# Patient Record
Sex: Female | Born: 1940 | Race: White | Hispanic: No | Smoking: Never smoker
Health system: Southern US, Community
[De-identification: ages and names within clinical notes are randomized; demographics above are authoritative.]

## PROBLEM LIST (undated history)

## (undated) DIAGNOSIS — I10 Essential (primary) hypertension: Secondary | ICD-10-CM

## (undated) DIAGNOSIS — I4891 Unspecified atrial fibrillation: Secondary | ICD-10-CM

## (undated) DIAGNOSIS — M549 Dorsalgia, unspecified: Secondary | ICD-10-CM

## (undated) HISTORY — PX: TUBAL LIGATION: SHX77

## (undated) HISTORY — DX: Essential (primary) hypertension: I10

## (undated) HISTORY — PX: TONSILLECTOMY: SUR1361

## (undated) HISTORY — DX: Dorsalgia, unspecified: M54.9

## (undated) HISTORY — DX: Unspecified atrial fibrillation: I48.91

---

## 2009-11-04 ENCOUNTER — Ambulatory Visit: Payer: Self-pay | Admitting: Family Medicine

## 2009-11-04 DIAGNOSIS — E785 Hyperlipidemia, unspecified: Secondary | ICD-10-CM

## 2009-11-04 DIAGNOSIS — M549 Dorsalgia, unspecified: Secondary | ICD-10-CM | POA: Insufficient documentation

## 2009-11-04 DIAGNOSIS — Z780 Asymptomatic menopausal state: Secondary | ICD-10-CM | POA: Insufficient documentation

## 2009-11-04 DIAGNOSIS — E559 Vitamin D deficiency, unspecified: Secondary | ICD-10-CM | POA: Insufficient documentation

## 2009-11-04 DIAGNOSIS — R5381 Other malaise: Secondary | ICD-10-CM

## 2009-11-04 DIAGNOSIS — R5383 Other fatigue: Secondary | ICD-10-CM

## 2009-11-10 ENCOUNTER — Ambulatory Visit: Payer: Self-pay | Admitting: Family Medicine

## 2009-11-10 DIAGNOSIS — S63509A Unspecified sprain of unspecified wrist, initial encounter: Secondary | ICD-10-CM | POA: Insufficient documentation

## 2009-11-10 DIAGNOSIS — M25539 Pain in unspecified wrist: Secondary | ICD-10-CM

## 2009-11-10 DIAGNOSIS — M810 Age-related osteoporosis without current pathological fracture: Secondary | ICD-10-CM | POA: Insufficient documentation

## 2009-11-12 ENCOUNTER — Telehealth (INDEPENDENT_AMBULATORY_CARE_PROVIDER_SITE_OTHER): Payer: Self-pay | Admitting: *Deleted

## 2010-03-10 ENCOUNTER — Ambulatory Visit
Admission: RE | Admit: 2010-03-10 | Discharge: 2010-03-10 | Payer: Self-pay | Source: Home / Self Care | Attending: Family Medicine | Admitting: Family Medicine

## 2010-03-10 DIAGNOSIS — R030 Elevated blood-pressure reading, without diagnosis of hypertension: Secondary | ICD-10-CM | POA: Insufficient documentation

## 2010-03-31 NOTE — Progress Notes (Signed)
  Phone Note Outgoing Call   Call placed by: Lajean Saver RN,  November 12, 2009 3:42 PM Call placed to: Patient  Follow-up for Phone Call        Phone call completed Pateint is still wearing her brace and says the swelling has decreased. She still has some pain when turning her wrist. She was told to call with any questions. Follow-up by: Lajean Saver RN,  November 12, 2009 3:43 PM

## 2010-03-31 NOTE — Assessment & Plan Note (Signed)
Summary: NOV high cholesterol   Vital Signs:  Patient profile:   70 year old female Menstrual status:  postmenopausal Height:      66 inches Weight:      143 pounds BMI:     23.16 O2 Sat:      97 % on Room air Pulse rate:   77 / minute BP sitting:   147 / 85  (left arm) Cuff size:   regular  Vitals Entered By: Payton Spark CMA (November 04, 2009 9:35 AM)  O2 Flow:  Room air CC: New to est.     Menstrual Status postmenopausal   Primary Care [facility] Favor:  Seymour Bars DO  CC:  New to est..  History of Present Illness: 70 yo WF presents for NOV.  She has a hx of lumbar DDD with ? lumbar stenosis from work related 'wear and tear' injury.  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  Previously she was only seen by Dr Su Monks.  She is doing well on Robaxin 2-3 x a day.  Takes Excedrin only as needed.  She has hx of high cholesterol , migraines and allergies.  She has not had labs drawn since 1992.  She has not had a colonoscopy or mammogram in years.  She is sad to report that her husband died 2 yrs ago from liver cancer.  She declines need for anti depressants.  Her son is supportive and helping her out with their farm and horses.    For her back, she had done injections, therapy and meds.  Has not needed surgery.  Robaxin helps.  Has some leg weakness but rests ever 30 min with physical activity.    Current Medications (verified): 1)  Methocarbamol 500 Mg Tabs (Methocarbamol) .... Take 1 Tab By Mouth Three Times A Day 2)  Allegra Allergy 180 Mg Tabs (Fexofenadine Hcl) .... Take 1 Tab By Mouth Once Daily 3)  Imitrex 100 Mg Tabs (Sumatriptan Succinate) .... Take As Directed As Needed 4)  Lovaza 1 Gm Caps (Omega-3-Acid Ethyl Esters) .... Take 1 Tab By Mouth Once Daily 5)  Coq10 30 Mg Caps (Coenzyme Q10) .... Take As Directed  Allergies (verified): No Known Drug Allergies  Past History:  Past Medical History: back pain  1992  Past Surgical History: BTL   tonsilectomy  Family History: father died ETOH 2 brothers - one died with cancer, unknown primary mother 59, alive  Social History: Retired.  Finished HS. Widowed since 09. son and daughter are local. Owns a farm and shows horses.   Never smoked. Denies ETOH.  Review of Systems       no fevers/sweats/weakness, unexplained wt loss/gain, no change in vision, no difficulty hearing, ringing in ears, no hay fever/allergies, no CP/discomfort, no palpitations, no breast lump/nipple discharge, no cough/wheeze, no blood in stool,no  N/V/D, no nocturia, no leaking urine, no unusual vag bleeding, no vaginal/penile discharge, no muscle/joint pain, no rash, no new/changing mole, no HA, no memory loss, no anxiety, no sleep problem, no depression, no unexplained lumps, no easy bruising/bleeding, no concern with sexual function   Physical Exam  General:  alert, well-developed, well-nourished, and well-hydrated.   Head:  normocephalic and atraumatic.   Mouth:  pharynx pink and moist.   Neck:  no masses.   Lungs:  Normal respiratory effort, chest expands symmetrically. Lungs are clear to auscultation, no crackles or wheezes. Heart:  Normal rate and regular rhythm. S1 and S2 normal without gallop, murmur, click, rub or other  extra sounds. Abdomen:  soft, non-tender, normal bowel sounds, no distention, no masses, no guarding, no hepatomegaly, and no splenomegaly.   Extremities:  no LE edema Skin:  color normal.   Cervical Nodes:  No lymphadenopathy noted Psych:  good eye contact and depressed affect.     Impression & Recommendations:  Problem # 1:  BACK PAIN, CHRONIC (ICD-724.5) Stable on current meds.  Staying physically active. Her updated medication list for this problem includes:    Methocarbamol 500 Mg Tabs (Methocarbamol) .Marland Kitchen... Take 1 tab by mouth three times a day    Excedrin Migraine 250-250-65 Mg Tabs (Aspirin-acetaminophen-caffeine) .Marland Kitchen... Take as directed as needed  Problem # 2:   HYPERLIPIDEMIA (ICD-272.4) Will update her labs and decide if Lovaza needs to be increased. Her updated medication list for this problem includes:    Lovaza 1 Gm Caps (Omega-3-acid ethyl esters) .Marland Kitchen... Take 1 tab by mouth once daily  Orders: T-Lipid Profile ([id number])  Complete Medication List: 1)  Methocarbamol 500 Mg Tabs (Methocarbamol) .... Take 1 tab by mouth three times a day 2)  Allegra Allergy 180 Mg Tabs (Fexofenadine hcl) .... Take 1 tab by mouth once daily 3)  Imitrex 100 Mg Tabs (Sumatriptan succinate) .... Take as directed as needed 4)  Lovaza 1 Gm Caps (Omega-3-acid ethyl esters) .... Take 1 tab by mouth once daily 5)  Coq10 30 Mg Caps (Coenzyme q10) .... Take as directed 6)  Excedrin Migraine 250-250-65 Mg Tabs (Aspirin-acetaminophen-caffeine) .... Take as directed as needed  Other Orders: T-CBC No Diff ([id number]) T-Comprehensive Metabolic Panel [phone]) T-Vitamin D (25-Hydroxy) [phone]) T-DXA Bone Density/ Appendicular (28413) T-Dual DXA Bone Density/ Axial (24401) T-Mammography Bilateral Screening (02725)  Patient Instructions: 1)  Update fasting labs downstairs. 2)  Will call you w/ results the following day. 3)  Update mammogram and bone density downstairs at your convenience. 4)  Meds RFd. 5)  BP a little high today. 6)  REturn for f/u back pain/ BP in 4 mos. Prescriptions: COQ10 30 MG CAPS (COENZYME Q10) Take as directed  #30 x 6   Entered and Authorized by:   Seymour Bars DO   Signed by:   Seymour Bars DO on 11/04/2009   Method used:   Print then Give to Patient   RxID:   [id number] OVFIEPP ALLERGY 180 MG TABS (FEXOFENADINE HCL) Take 1 tab by mouth once daily  #30 x 6   Entered and Authorized by:   Seymour Bars DO   Signed by:   Seymour Bars DO on 11/04/2009   Method used:   Print then Give to Patient   RxID:   [id number] LOVAZA 1 GM CAPS (OMEGA-3-ACID ETHYL ESTERS) Take 1 tab by mouth once daily  #90 x 1   Entered and Authorized  by:   Seymour Bars DO   Signed by:   Seymour Bars DO on 11/04/2009   Method used:   Print then Give to Patient   RxID:   [id number] IMITREX 100 MG TABS (SUMATRIPTAN SUCCINATE) Take as directed as needed  #27 tabs x 1   Entered and Authorized by:   Seymour Bars DO   Signed by:   Seymour Bars DO on 11/04/2009   Method used:   Print then Give to Patient   RxID:   [id number] METHOCARBAMOL 500 MG TABS (METHOCARBAMOL) Take 1 tab by mouth three times a day  #270 x 1   Entered and Authorized by:   Seymour Bars DO   Signed by:   Seymour Bars  DO on 11/04/2009   Method used:   Print then Give to Patient   RxID:   [id number]

## 2010-03-31 NOTE — Assessment & Plan Note (Signed)
Summary: FELL & HURT WRIST/WB Room 3   Vital Signs:  Patient Profile:   70 Years Old Female CC:      Left wrist and hand pain from fall x 4 days Height:     66 inches Weight:      143 pounds O2 Sat:      99 % O2 treatment:    Room Air Temp:     99.1 degrees F oral Pulse rate:   65 / minute Pulse rhythm:   regular Resp:     16 per minute BP sitting:   145 / 90  (left arm) Cuff size:   regular  Vitals Entered By: Emilio Math (November 10, 2009 10:36 AM)                  Current Allergies (reviewed today): No known allergies History of Present Illness Chief Complaint: Left wrist and hand pain from fall x 4 days History of Present Illness:  Subjective:  Patient complains of falling two days ago on her left wrist.  She has had persistent mild pain/swelling in her left wrist.  Current Meds METHOCARBAMOL 500 MG TABS (METHOCARBAMOL) Take 1 tab by mouth three times a day ALLEGRA ALLERGY 180 MG TABS (FEXOFENADINE HCL) Take 1 tab by mouth once daily IMITREX 100 MG TABS (SUMATRIPTAN SUCCINATE) Take as directed as needed LOVAZA 1 GM CAPS (OMEGA-3-ACID ETHYL ESTERS) Take 1 tab by mouth once daily COQ10 30 MG CAPS (COENZYME Q10) Take as directed EXCEDRIN MIGRAINE 250-250-65 MG TABS (ASPIRIN-ACETAMINOPHEN-CAFFEINE) Take as directed as needed  REVIEW OF SYSTEMS Constitutional Symptoms      Denies fever, chills, night sweats, weight loss, weight gain, and fatigue.  Eyes       Denies change in vision, eye pain, eye discharge, glasses, contact lenses, and eye surgery. Ear/Nose/Throat/Mouth       Denies hearing loss/aids, change in hearing, ear pain, ear discharge, dizziness, frequent runny nose, frequent nose bleeds, sinus problems, sore throat, hoarseness, and tooth pain or bleeding.  Respiratory       Denies dry cough, productive cough, wheezing, shortness of breath, asthma, bronchitis, and emphysema/COPD.  Cardiovascular       Denies murmurs, chest pain, and tires easily with  exhertion.    Gastrointestinal       Denies stomach pain, nausea/vomiting, diarrhea, constipation, blood in bowel movements, and indigestion. Genitourniary       Denies painful urination, kidney stones, and loss of urinary control. Neurological       Denies paralysis, seizures, and fainting/blackouts. Musculoskeletal       Complains of muscle pain, joint pain, joint stiffness, decreased range of motion, redness, and swelling.      Denies muscle weakness and gout.  Skin       Denies bruising, unusual mles/lumps or sores, and hair/skin or nail changes.  Psych       Denies mood changes, temper/anger issues, anxiety/stress, speech problems, depression, and sleep problems.  Past History:  Past Medical History: Reviewed history from 11/04/2009 and no changes required. back pain  1992  Past Surgical History: Reviewed history from 11/04/2009 and no changes required. BTL  tonsilectomy  Social History: Reviewed history from 11/04/2009 and no changes required. Retired.  Finished HS. Widowed since 09. son and daughter are local. Owns a farm and shows horses.   Never smoked. Denies ETOH.   Objective:  No acute distress  Left wrist:  Mild diffuse swelling.  Decreased range of motion.  Mild tenderness dorsal and ventral  surface of wrist.  No snuffbox tenderness.  Distal neurovascular intact.  Fingers have full range of motion.  X-ray left wrist:  No fracture.  Osteopenia and degenerative changes. Assessment New Problems: WRIST SPRAIN, LEFT (ICD-842.00) OSTEOPENIA (ICD-733.90) WRIST PAIN, LEFT (ICD-719.43)   Plan New Orders: T-DG Wrist Complete*L* [73110] New Patient Level III [54098] Wrist & Forearm Splint any size [J1914] Planning Comments:   Dispensed wrsit splint:  wear for about  5 to 7 days.  Apply ice pack several times daily until swelling decreases.  Continue ibuprofen.  Begin range of motion exercises in about  5 days ([facility] information and instruction patient  handout given)  Follow-up with PCP for DEXA scan and Vitamin D level.  Recommend beginning calcium/vitamin D supplement. Follow-up with [facility] if not improving 2 to 3 weeks.   The patient and/or caregiver has been counseled thoroughly with regard to medications prescribed including dosage, schedule, interactions, rationale for use, and possible side effects and they verbalize understanding.  Diagnoses and expected course of recovery discussed and will return if not improved as expected or if the condition worsens. Patient and/or caregiver verbalized understanding.   Patient Instructions: 1)  Recommend bone density testing (DEXA Scan) 2)  Recommend followup with family foctor for Vitamin D level. 3)  Take daily vitamin D and calcium  Orders Added: 1)  T-DG Wrist Complete*L* [73110] 2)  New Patient Level III [78295] 3)  Wrist & Forearm Splint any size [A2130]

## 2010-04-02 NOTE — Assessment & Plan Note (Signed)
Summary: f/u BP/ chol   Vital Signs:  Patient profile:   70 year old female Menstrual status:  postmenopausal Height:      66 inches Weight:      142 pounds Pulse rate:   70 / minute BP sitting:   133 / 79  (left arm) Cuff size:   regular  Vitals Entered By: Kathlene November LPN (March 10, 2010 10:06 AM) CC: follow-up visit   Primary Care Provider:  Seymour Bars DO  CC:  follow-up visit.  History of Present Illness: 70 yo WF presents for f/u HTN and high cholesterol.  She is doing well on Lovaza.  Has failed to get her bloodwork done.  She is pre-occupied with her 1 yo mother and helping out with her granddaughter.  She is physically active.  She feels well.  Denies palpitations or edema.  Her back pain has improved overall.    Current Medications (verified): 1)  Methocarbamol 500 Mg Tabs (Methocarbamol) .... Take 1 Tab By Mouth Three Times A Day 2)  Allegra Allergy 180 Mg Tabs (Fexofenadine Hcl) .... Take 1 Tab By Mouth Once Daily 3)  Imitrex 100 Mg Tabs (Sumatriptan Succinate) .... Take As Directed As Needed 4)  Lovaza 1 Gm Caps (Omega-3-Acid Ethyl Esters) .... Take 1 Tab By Mouth Once Daily 5)  Coq10 30 Mg Caps (Coenzyme Q10) .... Take As Directed 6)  Excedrin Migraine 250-250-65 Mg Tabs (Aspirin-Acetaminophen-Caffeine) .... Take As Directed As Needed  Allergies (verified): No Known Drug Allergies  Comments:  Nurse/Medical Assistant: The patient's medications and allergies were reviewed with the patient and were updated in the Medication and Allergy Lists. Kathlene November LPN (March 10, 2010 10:07 AM)  Past History:  Past Medical History: Reviewed history from 11/04/2009 and no changes required. back pain  1992  Past Surgical History: Reviewed history from 11/04/2009 and no changes required. BTL  tonsilectomy  Family History: Reviewed history from 11/04/2009 and no changes required. father died ETOH 2 brothers - one died with cancer, unknown primary mother 57,  alive  Social History: Reviewed history from 11/04/2009 and no changes required. Retired.  Finished HS. Widowed since 09. son and daughter are local. Owns a farm and shows horses.   Never smoked. Denies ETOH.  Review of Systems      See HPI  Physical Exam  General:  alert, well-developed, well-nourished, and well-hydrated.   Head:  normocephalic and atraumatic.   Eyes:  pupils equal, pupils round, and pupils reactive to light.   Mouth:  pharynx pink and moist.   Neck:  no masses.  no audible carotid bruits Lungs:  Normal respiratory effort, chest expands symmetrically. Lungs are clear to auscultation, no crackles or wheezes. Heart:  Normal rate and regular rhythm. S1 and S2 normal without gallop, murmur, click, rub or other extra sounds. Extremities:  no LE edema Skin:  color normal.   Psych:  good eye contact, not anxious appearing, and not depressed appearing.     Impression & Recommendations:  Problem # 1:  HYPERLIPIDEMIA (ICD-272.4) Still due for fasting labs.  REprinted lab order.   Her updated medication list for this problem includes:    Lovaza 1 Gm Caps (Omega-3-acid ethyl esters) .Marland Kitchen... Take 1 tab by mouth once daily  Problem # 2:  ELEVATED BLOOD PRESSURE WITHOUT DIAGNOSIS OF HYPERTENSION (ICD-796.2) BP improved with addition of Lovaza so will continue and will not need to start anti hypertensives.  Complete Medication List: 1)  Methocarbamol 500 Mg Tabs (Methocarbamol) .Marland KitchenMarland KitchenMarland Kitchen  Take 1 tab by mouth three times a day 2)  Allegra Allergy 180 Mg Tabs (Fexofenadine hcl) .... Take 1 tab by mouth once daily 3)  Imitrex 100 Mg Tabs (Sumatriptan succinate) .... Take as directed as needed 4)  Lovaza 1 Gm Caps (Omega-3-acid ethyl esters) .... Take 1 tab by mouth once daily 5)  Coq10 30 Mg Caps (Coenzyme q10) .... Take as directed 6)  Excedrin Migraine 250-250-65 Mg Tabs (Aspirin-acetaminophen-caffeine) .... Take as directed as needed  Patient Instructions: 1)  Update fasting  labs one morning downstairs. 2)  Will call you w/ the results. 3)  Return for a MEDICARE WELLNESS VISIT in 6 months. Prescriptions: LOVAZA 1 GM CAPS (OMEGA-3-ACID ETHYL ESTERS) Take 1 tab by mouth once daily  #90 x 1   Entered and Authorized by:   Seymour Bars DO   Signed by:   Seymour Bars DO on 03/10/2010   Method used:   Print then Give to Patient   RxID:   [id number]    Orders Added: 1)  Est. Patient Level III [13244]

## 2010-09-05 ENCOUNTER — Encounter: Payer: Self-pay | Admitting: Family Medicine

## 2010-09-07 ENCOUNTER — Telehealth: Payer: Self-pay | Admitting: *Deleted

## 2010-09-07 DIAGNOSIS — Z1329 Encounter for screening for other suspected endocrine disorder: Secondary | ICD-10-CM

## 2010-09-07 DIAGNOSIS — Z130 Encounter for screening for diseases of the blood and blood-forming organs and certain disorders involving the immune mechanism: Secondary | ICD-10-CM

## 2010-09-07 DIAGNOSIS — E785 Hyperlipidemia, unspecified: Secondary | ICD-10-CM

## 2010-09-07 DIAGNOSIS — R5381 Other malaise: Secondary | ICD-10-CM

## 2010-09-07 DIAGNOSIS — R5383 Other fatigue: Secondary | ICD-10-CM

## 2010-09-07 DIAGNOSIS — E559 Vitamin D deficiency, unspecified: Secondary | ICD-10-CM

## 2010-09-07 LAB — LIPID PANEL
Cholesterol: 184 mg/dL (ref 0–200)
HDL: 68 mg/dL (ref >39)
Total CHOL/HDL Ratio: 2.7 Ratio
Triglycerides: 61 mg/dL (ref <150)
VLDL: 12 mg/dL (ref 0–40)

## 2010-09-07 LAB — CBC
Hemoglobin: 13.9 g/dL (ref 12.0–15.0)
MCHC: 33 g/dL (ref 30.0–36.0)
RBC: 4.53 MIL/uL (ref 3.87–5.11)
WBC: 5 10*3/uL (ref 4.0–10.5)

## 2010-09-07 NOTE — Telephone Encounter (Signed)
Labs ordered.

## 2010-09-08 ENCOUNTER — Encounter: Payer: Self-pay | Admitting: Family Medicine

## 2010-09-08 ENCOUNTER — Ambulatory Visit (INDEPENDENT_AMBULATORY_CARE_PROVIDER_SITE_OTHER): Payer: Medicare Other | Admitting: Family Medicine

## 2010-09-08 ENCOUNTER — Telehealth: Payer: Self-pay | Admitting: Family Medicine

## 2010-09-08 VITALS — BP 145/87 | HR 72 | Temp 98.4°F | Ht 66.0 in | Wt 142.0 lb

## 2010-09-08 DIAGNOSIS — Z136 Encounter for screening for cardiovascular disorders: Secondary | ICD-10-CM

## 2010-09-08 DIAGNOSIS — Z0000 Encounter for general adult medical examination without abnormal findings: Secondary | ICD-10-CM

## 2010-09-08 LAB — COMPLETE METABOLIC PANEL WITH GFR
Albumin: 4.4 g/dL (ref 3.5–5.2)
Alkaline Phosphatase: 67 U/L (ref 39–117)
BUN: 14 mg/dL (ref 6–23)
GFR, Est Non African American: >60 mL/min (ref >60)
Glucose, Bld: 87 mg/dL (ref 70–99)
Potassium: 4.4 mEq/L (ref 3.5–5.3)

## 2010-09-08 MED ORDER — FEXOFENADINE HCL 180 MG PO TABS: 180.0000 mg | ORAL_TABLET | Freq: Every day | ORAL | Status: DC

## 2010-09-08 MED ORDER — OMEGA-3-ACID ETHYL ESTERS 1 G PO CAPS: 2.0000 g | ORAL_CAPSULE | Freq: Two times a day (BID) | ORAL | Status: DC

## 2010-09-08 MED ORDER — COENZYME Q10 30 MG PO CAPS: 30.0000 mg | ORAL_CAPSULE | Freq: Every day | ORAL | Status: DC

## 2010-09-08 NOTE — Progress Notes (Signed)
  Subjective:    Patient ID: Christy Gutierrez, female    DOB: 06-03-1940, 70 y.o.   MRN: [id number]  HPI  See scanned medicare wellness form.       Review of Systems  Constitutional: Negative for unexpected weight change.  Eyes: Negative for visual disturbance.  Respiratory: Negative for shortness of breath.   Cardiovascular: Negative for chest pain and palpitations.  Gastrointestinal: Negative for nausea, abdominal pain, diarrhea, constipation and blood in stool.  Genitourinary: Negative for vaginal bleeding and difficulty urinating.  Musculoskeletal: Negative for myalgias, arthralgias and gait problem.  Neurological: Positive for headaches.  Psychiatric/Behavioral: Positive for dysphoric mood.       Objective:   Physical Exam  Constitutional: She appears well-developed and well-nourished.  HENT:  Mouth/Throat: Oropharynx is clear and moist.  Eyes: No scleral icterus.  Neck: Neck supple. No thyromegaly present.       No audible carotid bruits  Cardiovascular: Normal rate, regular rhythm and normal heart sounds.   No murmur heard. Pulmonary/Chest: Effort normal and breath sounds normal.  Musculoskeletal: She exhibits no edema.  Lymphadenopathy:    She has no cervical adenopathy.  Skin: Skin is warm and dry.  Psychiatric: She has a normal mood and affect.          Assessment & Plan:  1.  [facility]  EKG done, NSR at 61 bpm, PR of 142 msec, QTc of 422 msec, TWI in V1 and V2, o/w normal. Labs UTD, copy given to pt.  Copy of health maintenance form reviewed and given to pt.  She declined a mammogram, pap smear, colonoscopy, PNX, DEXA scan or antidepressants and understands the risk of not proceeding with these things.  Advised her to call me if she changes her mind.  Since she is not a direct threat to herself or others, I cannot make her do these or take meds.

## 2010-09-08 NOTE — Telephone Encounter (Signed)
Pt aware of the above  

## 2010-09-08 NOTE — Telephone Encounter (Signed)
Please call pt.  Chemistry panel shows a normal fasting sugar, liver and kidney function with excellent cholesterol results.  Vitamin D is normal.  Blood counts are normal.  Repeat in 1-2 yrs.

## 2010-09-08 NOTE — Patient Instructions (Signed)
Go up on Lovaza to 2 capsule in the morning and 2 capsules at night with CoEnzyme Q-10. Call me if Headaches have not improved in 2 wks.  Return for a nurse BP check in 15month.    Labs look great!

## 2010-09-16 ENCOUNTER — Encounter: Payer: Self-pay | Admitting: Family Medicine

## 2010-10-12 ENCOUNTER — Ambulatory Visit (INDEPENDENT_AMBULATORY_CARE_PROVIDER_SITE_OTHER): Payer: Medicare Other | Admitting: Family Medicine

## 2010-10-12 VITALS — BP 137/89 | HR 83 | Wt 141.0 lb

## 2010-10-12 DIAGNOSIS — R030 Elevated blood-pressure reading, without diagnosis of hypertension: Secondary | ICD-10-CM

## 2010-10-12 NOTE — Progress Notes (Signed)
  Subjective:    Patient ID: Christy Gutierrez, female    DOB: 03-08-1940, 70 y.o.   MRN: [id number]  HPI  Pt denies chest pain, SOB, dizziness, or heart palpitations.   5 min spent with pt.     Review of Systems     Objective:   Physical Exam        Assessment & Plan:  BP is better today. Will follow. Recheck in 6 months.

## 2010-11-23 ENCOUNTER — Other Ambulatory Visit: Payer: Self-pay | Admitting: Family Medicine

## 2010-11-23 MED ORDER — OMEGA-3-ACID ETHYL ESTERS 1 G PO CAPS: 2.0000 g | ORAL_CAPSULE | Freq: Two times a day (BID) | ORAL | Status: DC

## 2010-11-23 NOTE — Telephone Encounter (Signed)
Pt requesting refill for lovaza 1 mg.  Take 2 twice daily.  #360/1 refill sent to [facility].  Pt aware. Jarvis Newcomer, LPN Domingo Dimes

## 2010-12-11 ENCOUNTER — Other Ambulatory Visit: Payer: Self-pay | Admitting: *Deleted

## 2010-12-11 MED ORDER — SUMATRIPTAN SUCCINATE 100 MG PO TABS: 100.0000 mg | ORAL_TABLET | ORAL | Status: DC | PRN

## 2010-12-11 MED ORDER — METHOCARBAMOL 500 MG PO TABS: 500.0000 mg | ORAL_TABLET | Freq: Three times a day (TID) | ORAL | Status: DC

## 2011-04-13 ENCOUNTER — Ambulatory Visit (INDEPENDENT_AMBULATORY_CARE_PROVIDER_SITE_OTHER): Payer: Medicare Other | Admitting: Physician Assistant

## 2011-04-13 ENCOUNTER — Encounter: Payer: Self-pay | Admitting: Physician Assistant

## 2011-04-13 VITALS — BP 137/84 | HR 86 | Temp 98.1°F | Wt 145.0 lb

## 2011-04-13 DIAGNOSIS — J329 Chronic sinusitis, unspecified: Secondary | ICD-10-CM

## 2011-04-13 MED ORDER — AMBULATORY NON FORMULARY MEDICATION: 1.0000 | Freq: Once | Status: DC

## 2011-04-13 MED ORDER — AMOXICILLIN-POT CLAVULANATE 875-125 MG PO TABS: 1.0000 | ORAL_TABLET | Freq: Two times a day (BID) | ORAL | Status: AC

## 2011-04-13 NOTE — Progress Notes (Signed)
  Subjective:    Patient ID: Christy Gutierrez, female    DOB: 1940-04-23, 71 y.o.   MRN: [id number]  HPI Patient presents to the clinic with upper respiratory symptoms for 4 days. Her symptoms started with runny nose, sore throat and ear pain. Gradually progressed into sinus pressure and pain behind the eyes. She reports she felt hot last night but has not taken her temperature. She has had some chills but denies any nausea or vomiting. Her nose continues to run and she has developed a cough with no production. She has a history of multiple sinus infections. She's tried Excedrin for her headaches but it has not helped at all. She does not like to take over-the-counter medication.      Review of Systems     Objective:   Physical Exam  Constitutional: She is oriented to person, place, and time. She appears well-developed and well-nourished.  HENT:  Head: Normocephalic and atraumatic.  Right Ear: External ear normal.  Left Ear: External ear normal.       Bilateral maxillary tenderness.  Oropharynx erythematous with post nasal drip.  Nares erythematous.  Eyes: Conjunctivae are normal.  Neck:       Anterior cervical adenopathy with tenderness to palpation.  Cardiovascular: Normal rate, regular rhythm and normal heart sounds.   Pulmonary/Chest: Effort normal and breath sounds normal. She has no wheezes.  Lymphadenopathy:    She has cervical adenopathy.  Neurological: She is alert and oriented to person, place, and time.  Psychiatric: She has a normal mood and affect. Her behavior is normal.          Assessment & Plan:  Sinusitis-Augmentin BID for 10 days. Continue with Mucinex and Tylenol for pain relief and symptoms.   Need for Vaccine-Gave Rx for Zostavax to get when she is feeling better.

## 2011-04-13 NOTE — Patient Instructions (Addendum)
Start Augmentin for 10 days. Continue any symptomatic treatment as needed.

## 2011-04-21 ENCOUNTER — Telehealth: Payer: Self-pay | Admitting: Family Medicine

## 2011-04-21 NOTE — Telephone Encounter (Signed)
Physical patient learned that her insurance will no longer cover her muscle relaxer methocarbamol because of increased risk in patients who are over to the age of 37. There is an increased risk of falls and sedation and weakness. She is still having significant muscle pain and problems and we may need to consider other alternatives such as physical therapy, chiropractic care, or massage therapy in addition to using heat and stretches. Use of the medication should definitely be limited to as needed use.

## 2011-04-22 NOTE — Telephone Encounter (Signed)
Pt notified; pt has not been taking this medication regularly. Was thinking about stopping it anyway since she does not have muscle pain anymore

## 2011-05-12 ENCOUNTER — Other Ambulatory Visit: Payer: Self-pay | Admitting: Family Medicine

## 2011-05-20 ENCOUNTER — Encounter: Payer: Self-pay | Admitting: Family Medicine

## 2011-05-20 ENCOUNTER — Ambulatory Visit (INDEPENDENT_AMBULATORY_CARE_PROVIDER_SITE_OTHER): Payer: Medicare Other | Admitting: Family Medicine

## 2011-05-20 VITALS — BP 135/79 | HR 75 | Temp 98.2°F | Ht 66.0 in | Wt 144.0 lb

## 2011-05-20 DIAGNOSIS — J329 Chronic sinusitis, unspecified: Secondary | ICD-10-CM

## 2011-05-20 DIAGNOSIS — J00 Acute nasopharyngitis [common cold]: Secondary | ICD-10-CM | POA: Diagnosis not present

## 2011-05-20 DIAGNOSIS — J0100 Acute maxillary sinusitis, unspecified: Secondary | ICD-10-CM

## 2011-05-20 MED ORDER — CETIRIZINE-PSEUDOEPHEDRINE ER 5-120 MG PO TB12: 1.0000 | ORAL_TABLET | Freq: Two times a day (BID) | ORAL | Status: DC

## 2011-05-20 MED ORDER — FLUTICASONE PROPIONATE 50 MCG/ACT NA SUSP: 2.0000 | Freq: Every day | NASAL | Status: DC

## 2011-05-20 MED ORDER — CEFDINIR 300 MG PO CAPS: 300.0000 mg | ORAL_CAPSULE | Freq: Two times a day (BID) | ORAL | Status: DC

## 2011-05-20 MED ORDER — CEFDINIR 300 MG PO CAPS: 300.0000 mg | ORAL_CAPSULE | Freq: Two times a day (BID) | ORAL | Status: AC

## 2011-05-20 NOTE — Patient Instructions (Signed)

## 2011-05-20 NOTE — Progress Notes (Signed)
  Subjective:    Patient ID: Christy Gutierrez, female    DOB: [birth year], 71 y.o.   MRN: [id number]  Sinusitis This is a recurrent problem. The current episode started in the past 7 days (Started bavck on FEburary 12th and treated but did not clear completly). Progression since onset: Sunday sarted getting worse and throat has gotten irritatd from the drainage. There has been no fever. Associated symptoms include congestion, coughing, a hoarse voice, sinus pressure, sneezing, a sore throat and swollen glands. Pertinent negatives include no chills, diaphoresis, ear pain or shortness of breath. Past treatments include antibiotics. The treatment provided no relief.  Cough Associated symptoms include a sore throat. Pertinent negatives include no chills, ear pain or shortness of breath.      Review of Systems  Constitutional: Negative for chills and diaphoresis.  HENT: Positive for congestion, sore throat, hoarse voice, sneezing and sinus pressure. Negative for ear pain.   Respiratory: Positive for cough. Negative for shortness of breath.       BP 135/79  Pulse 75  Temp(Src) 98.2 F (36.8 C) (Oral)  Ht 5\' 6"  (1.676 m)  Wt 144 lb (65.318 kg)  BMI 23.24 kg/m2  SpO2 96% Objective:   Physical Exam  Nursing note and vitals reviewed. Constitutional: She is oriented to person, place, and time. She appears well-developed and well-nourished. No distress.  HENT:  Head: Normocephalic and atraumatic.  Right Ear: Tympanic membrane, external ear and ear canal normal.  Left Ear: Tympanic membrane, external ear and ear canal normal.  Nose: Mucosal edema and rhinorrhea present. Right sinus exhibits maxillary sinus tenderness. Left sinus exhibits maxillary sinus tenderness.  Mouth/Throat: Oropharynx is clear and moist. No oral lesions. No oropharyngeal exudate.  Eyes: Right eye exhibits no discharge. Left eye exhibits no discharge. No scleral icterus.  Neck: Normal range of motion. Neck supple.    Cardiovascular: Normal rate, regular rhythm and normal heart sounds.   Pulmonary/Chest: Effort normal and breath sounds normal. She has no wheezes. She has no rales.  Lymphadenopathy:    She has cervical adenopathy.  Neurological: She is alert and oriented to person, place, and time.  Skin: Skin is warm and dry.  Psychiatric: She has a normal mood and affect.      Assessment & Plan:    Sinusitis/nasal pharyngitis. We'll place on Omnicef 300 mg twice a day, Flonase nasal spray 2 puffs each nostril daily, Zyrtec-D one tablet twice a day hold off taking her Allegra while taking the Zyrtec and notify us if she's not a lot better in a week.

## 2011-06-01 ENCOUNTER — Telehealth: Payer: Self-pay | Admitting: *Deleted

## 2011-06-01 DIAGNOSIS — J320 Chronic maxillary sinusitis: Secondary | ICD-10-CM

## 2011-06-01 MED ORDER — CEFDINIR 300 MG PO CAPS: 300.0000 mg | ORAL_CAPSULE | Freq: Two times a day (BID) | ORAL | Status: AC

## 2011-06-01 NOTE — Telephone Encounter (Signed)
Pt informed

## 2011-06-01 NOTE — Telephone Encounter (Signed)
Please let her know I sent 20 omnicef capsules take twice a day.

## 2011-06-01 NOTE — Telephone Encounter (Signed)
Pt states she has taken all the abx given and says the sinus infection is not gone. Please advise.

## 2011-10-27 DIAGNOSIS — H2510 Age-related nuclear cataract, unspecified eye: Secondary | ICD-10-CM | POA: Diagnosis not present

## 2011-11-29 ENCOUNTER — Other Ambulatory Visit: Payer: Self-pay | Admitting: *Deleted

## 2011-11-29 ENCOUNTER — Ambulatory Visit (INDEPENDENT_AMBULATORY_CARE_PROVIDER_SITE_OTHER): Payer: Medicare Other | Admitting: Family Medicine

## 2011-11-29 ENCOUNTER — Encounter: Payer: Self-pay | Admitting: Family Medicine

## 2011-11-29 VITALS — BP 149/81 | HR 78 | Wt 144.0 lb

## 2011-11-29 DIAGNOSIS — E785 Hyperlipidemia, unspecified: Secondary | ICD-10-CM

## 2011-11-29 DIAGNOSIS — J309 Allergic rhinitis, unspecified: Secondary | ICD-10-CM | POA: Diagnosis not present

## 2011-11-29 DIAGNOSIS — G43909 Migraine, unspecified, not intractable, without status migrainosus: Secondary | ICD-10-CM

## 2011-11-29 MED ORDER — OMEGA-3-ACID ETHYL ESTERS 1 G PO CAPS: 2.0000 g | ORAL_CAPSULE | Freq: Two times a day (BID) | ORAL | Status: DC

## 2011-11-29 MED ORDER — BETAMETHASONE VALERATE 0.1 % EX OINT: TOPICAL_OINTMENT | Freq: Every day | CUTANEOUS | Status: DC

## 2011-11-29 MED ORDER — SUMATRIPTAN SUCCINATE 100 MG PO TABS: 100.0000 mg | ORAL_TABLET | ORAL | Status: DC | PRN

## 2011-11-29 MED ORDER — FEXOFENADINE HCL 180 MG PO TABS: 180.0000 mg | ORAL_TABLET | Freq: Every day | ORAL | Status: DC

## 2011-11-29 NOTE — Progress Notes (Signed)
  Subjective:    Patient ID: Christy Gutierrez, female    DOB: 04-Nov-1940, 71 y.o.   MRN: [id number]  HPI Migraines well controlled. No sure if needing imitrex refill or not. Doesn't use often.   AR - Doing well. Needs printed rx for allegra for insurance to cover it. No longer using zyrtec D    Hyperlipidemia - Doing well on lovaza. Needs refills. Due for labwork.  No SE with the medication.     Review of Systems     Objective:   Physical Exam  Constitutional: She is oriented to person, place, and time. She appears well-developed and well-nourished.  HENT:  Head: Normocephalic and atraumatic.  Right Ear: External ear normal.  Left Ear: External ear normal.  Nose: Nose normal.  Mouth/Throat: Oropharynx is clear and moist.       TMs and canals are clear.   Eyes: Conjunctivae normal and EOM are normal. Pupils are equal, round, and reactive to light.  Neck: Neck supple. No thyromegaly present.  Cardiovascular: Normal rate, regular rhythm and normal heart sounds.   Pulmonary/Chest: Effort normal and breath sounds normal. She has no wheezes.  Lymphadenopathy:    She has no cervical adenopathy.  Neurological: She is alert and oriented to person, place, and time.  Skin: Skin is warm and dry.  Psychiatric: She has a normal mood and affect.          Assessment & Plan:  Migraines - well controlled. Refill sent for Imitrex.  AR - Printed allegra rx.   Hyperlipidemia - Will refills meds. Due for CMP and lpids.   Seh would also like a refill on her steroid cream she uses for a rash that she occasionally gets into the axilla and breast. Her GYN give it to her sometime back it works well. I did warn her to only use it once a day instead of twice a day and it is just as effective. Prescription sent to pharmacy.

## 2011-12-21 DIAGNOSIS — E785 Hyperlipidemia, unspecified: Secondary | ICD-10-CM | POA: Diagnosis not present

## 2011-12-21 LAB — LIPID PANEL
Cholesterol: 199 mg/dL (ref 0–200)
Total CHOL/HDL Ratio: 2.7 Ratio
VLDL: 9 mg/dL (ref 0–40)

## 2011-12-22 LAB — COMPLETE METABOLIC PANEL WITH GFR
AST: 25 U/L (ref 0–37)
Albumin: 4.5 g/dL (ref 3.5–5.2)
Alkaline Phosphatase: 56 U/L (ref 39–117)
Calcium: 9.2 mg/dL (ref 8.4–10.5)
Chloride: 105 mEq/L (ref 96–112)
Potassium: 4.4 mEq/L (ref 3.5–5.3)
Sodium: 140 mEq/L (ref 135–145)
Total Protein: 6.6 g/dL (ref 6.0–8.3)

## 2012-05-23 ENCOUNTER — Other Ambulatory Visit: Payer: Self-pay | Admitting: Family Medicine

## 2013-05-08 ENCOUNTER — Encounter: Payer: Self-pay | Admitting: Family Medicine

## 2013-05-08 ENCOUNTER — Ambulatory Visit (INDEPENDENT_AMBULATORY_CARE_PROVIDER_SITE_OTHER): Payer: Medicare Other | Admitting: Family Medicine

## 2013-05-08 VITALS — BP 146/86 | HR 75 | Wt 144.0 lb

## 2013-05-08 DIAGNOSIS — Z23 Encounter for immunization: Secondary | ICD-10-CM | POA: Diagnosis not present

## 2013-05-08 DIAGNOSIS — E785 Hyperlipidemia, unspecified: Secondary | ICD-10-CM

## 2013-05-08 DIAGNOSIS — Z780 Asymptomatic menopausal state: Secondary | ICD-10-CM

## 2013-05-08 DIAGNOSIS — R030 Elevated blood-pressure reading, without diagnosis of hypertension: Secondary | ICD-10-CM

## 2013-05-08 DIAGNOSIS — G43909 Migraine, unspecified, not intractable, without status migrainosus: Secondary | ICD-10-CM

## 2013-05-08 MED ORDER — FEXOFENADINE HCL 180 MG PO TABS: 180.0000 mg | ORAL_TABLET | Freq: Every day | ORAL | Status: DC

## 2013-05-08 MED ORDER — SUMATRIPTAN SUCCINATE 100 MG PO TABS: ORAL_TABLET | ORAL | Status: DC

## 2013-05-08 MED ORDER — OMEGA-3-ACID ETHYL ESTERS 1 G PO CAPS: 2.0000 g | ORAL_CAPSULE | Freq: Two times a day (BID) | ORAL | Status: DC

## 2013-05-08 NOTE — Progress Notes (Signed)
Subjective:    Patient ID: Christy Gutierrez, female    DOB: Oct 27, 1940, 73 y.o.   MRN: [id number]  HPI  Elevated BP - does do exercises for her back. No CP or SOB or palpitations.   Migraines- Using her imitrex prn.  Says emotion is a big trigger for her headaches. Feels like they are not as grequent as they used to be. Says usually has headache about once a week though varies.    Hyperlipidemia-she's currently on Levoxine. Last lipid panel was in 2013. Says gets regular exercise.     Wants Tdap today.     Review of Systems     BP 146/86  Pulse 75  Wt 144 lb (65.318 kg)    Allergies  Allergen Reactions  . Clarithromycin     Past Medical History  Diagnosis Date  . Back pain     Past Surgical History  Procedure Laterality Date  . Tubal ligation      bilateral  . Tonsillectomy      History   Social History  . Marital Status: Widowed    Spouse Name: N/A    Number of Children: N/A  . Years of Education: N/A   Occupational History  . Not on file.   Social History Main Topics  . Smoking status: Never Smoker   . Smokeless tobacco: Not on file  . Alcohol Use: No  . Drug Use:   . Sexual Activity:    Other Topics Concern  . Not on file   Social History Narrative  . No narrative on file    Family History  Problem Relation Age of Onset  . Alcohol abuse Father   . Cancer Brother     Outpatient Encounter Prescriptions as of 05/08/2013  Medication Sig  . aspirin-acetaminophen-caffeine (EXCEDRIN MIGRAINE) 250-250-65 MG per tablet Take 1 tablet by mouth as needed.    . betamethasone valerate ointment (VALISONE) 0.1 % Apply topically daily.  Marland Kitchen co-enzyme Q-10 30 MG capsule Take 1 capsule (30 mg total) by mouth daily.  . fexofenadine (ALLEGRA) 180 MG tablet Take 1 tablet (180 mg total) by mouth daily.  Marland Kitchen omega-3 acid ethyl esters (LOVAZA) 1 G capsule Take 2 capsules (2 g total) by mouth 2 (two) times daily.  . SUMAtriptan (IMITREX) 100 MG tablet TAKE AS DIRECTED AS  NEEDED  . [DISCONTINUED] fexofenadine (ALLEGRA) 180 MG tablet Take 1 tablet (180 mg total) by mouth daily.  . [DISCONTINUED] omega-3 acid ethyl esters (LOVAZA) 1 G capsule Take 2 capsules (2 g total) by mouth 2 (two) times daily.  . [DISCONTINUED] SUMAtriptan (IMITREX) 100 MG tablet TAKE AS DIRECTED AS NEEDED       Objective:   Physical Exam  Constitutional: She is oriented to person, place, and time. She appears well-developed and well-nourished.  HENT:  Head: Normocephalic and atraumatic.  Cardiovascular: Normal rate, regular rhythm and normal heart sounds.   Pulmonary/Chest: Effort normal and breath sounds normal.  Neurological: She is alert and oriented to person, place, and time.  Skin: Skin is warm and dry.  Psychiatric: She has a normal mood and affect. Her behavior is normal.          Assessment & Plan:   Elevated BP - well-controlled today.  Hyperlipidemia-Due to recheck lipids.    Migraines- under fair control.  RF her imitrex.  Still uses excedrin migraine for milder HA.    Tdap updated today. Declined flu vaccine and shingles vaccine.   Due for bone density. Will  order today. She does take calcium with vitamin D daily.

## 2013-05-16 DIAGNOSIS — E785 Hyperlipidemia, unspecified: Secondary | ICD-10-CM | POA: Diagnosis not present

## 2013-05-16 DIAGNOSIS — R030 Elevated blood-pressure reading, without diagnosis of hypertension: Secondary | ICD-10-CM | POA: Diagnosis not present

## 2013-05-16 LAB — COMPLETE METABOLIC PANEL WITH GFR
ALBUMIN: 4.2 g/dL (ref 3.5–5.2)
ALK PHOS: 61 U/L (ref 39–117)
ALT: 20 U/L (ref 0–35)
AST: 25 U/L (ref 0–37)
BUN: 14 mg/dL (ref 6–23)
CALCIUM: 8.7 mg/dL (ref 8.4–10.5)
CHLORIDE: 105 meq/L (ref 96–112)
CO2: 28 mEq/L (ref 19–32)
Creat: 0.69 mg/dL (ref 0.50–1.10)
GFR, Est African American: >89 mL/min
GFR, Est Non African American: 86 mL/min
Glucose, Bld: 92 mg/dL (ref 70–99)
POTASSIUM: 4 meq/L (ref 3.5–5.3)
SODIUM: 139 meq/L (ref 135–145)
TOTAL PROTEIN: 6.3 g/dL (ref 6.0–8.3)
Total Bilirubin: 0.6 mg/dL (ref 0.2–1.2)

## 2013-05-16 LAB — LIPID PANEL
CHOL/HDL RATIO: 2.8 ratio
Cholesterol: 203 mg/dL — ABNORMAL HIGH (ref 0–200)
HDL: 72 mg/dL (ref >39)
LDL CALC: 121 mg/dL — AB (ref 0–99)
Triglycerides: 50 mg/dL (ref <150)
VLDL: 10 mg/dL (ref 0–40)

## 2013-05-29 ENCOUNTER — Other Ambulatory Visit: Payer: Medicare Other

## 2013-06-12 ENCOUNTER — Ambulatory Visit (INDEPENDENT_AMBULATORY_CARE_PROVIDER_SITE_OTHER): Payer: Medicare Other

## 2013-06-12 DIAGNOSIS — M810 Age-related osteoporosis without current pathological fracture: Secondary | ICD-10-CM

## 2013-06-12 DIAGNOSIS — Z780 Asymptomatic menopausal state: Secondary | ICD-10-CM

## 2013-06-14 ENCOUNTER — Encounter: Payer: Self-pay | Admitting: Family Medicine

## 2013-09-07 ENCOUNTER — Encounter: Payer: Self-pay | Admitting: Emergency Medicine

## 2013-09-07 ENCOUNTER — Emergency Department (INDEPENDENT_AMBULATORY_CARE_PROVIDER_SITE_OTHER)
Admission: EM | Admit: 2013-09-07 | Discharge: 2013-09-07 | Disposition: A | Discharge status: Home / Self Care | Payer: Medicare Other | Source: Home / Self Care | Attending: Family Medicine | Admitting: Family Medicine

## 2013-09-07 DIAGNOSIS — L03119 Cellulitis of unspecified part of limb: Secondary | ICD-10-CM

## 2013-09-07 DIAGNOSIS — L03116 Cellulitis of left lower limb: Secondary | ICD-10-CM

## 2013-09-07 DIAGNOSIS — L02419 Cutaneous abscess of limb, unspecified: Secondary | ICD-10-CM

## 2013-09-07 MED ORDER — DOXYCYCLINE HYCLATE 100 MG PO CAPS: 100.0000 mg | ORAL_CAPSULE | Freq: Two times a day (BID) | ORAL | Status: DC

## 2013-09-07 MED ORDER — MUPIROCIN 2 % EX OINT: 1.0000 "application " | TOPICAL_OINTMENT | Freq: Three times a day (TID) | CUTANEOUS | Status: DC

## 2013-09-07 MED ORDER — ONDANSETRON 4 MG PO TBDP: ORAL_TABLET | ORAL | Status: DC

## 2013-09-07 NOTE — Discharge Instructions (Signed)
Apply warm compress 3 to 4 times daily. If symptoms become significantly worse during the night or over the weekend, proceed to the local emergency room.    Cellulitis Cellulitis is an infection of the skin and the tissue beneath it. The infected area is usually red and tender. Cellulitis occurs most often in the arms and lower legs.  CAUSES  Cellulitis is caused by bacteria that enter the skin through cracks or cuts in the skin. The most common types of bacteria that cause cellulitis are Staphylococcus and Streptococcus. SYMPTOMS   Redness and warmth.  Swelling.  Tenderness or pain.  Fever. DIAGNOSIS  Your caregiver can usually determine what is wrong based on a physical exam. Blood tests may also be done. TREATMENT  Treatment usually involves taking an antibiotic medicine. HOME CARE INSTRUCTIONS   Take your antibiotics as directed. Finish them even if you start to feel better.  Keep the infected arm or leg elevated to reduce swelling.  Apply a warm cloth to the affected area up to 4 times per day to relieve pain.  Only take over-the-counter or prescription medicines for pain, discomfort, or fever as directed by your caregiver.  Keep all follow-up appointments as directed by your caregiver. SEEK MEDICAL CARE IF:   You notice red streaks coming from the infected area.  Your red area gets larger or turns dark in color.  Your bone or joint underneath the infected area becomes painful after the skin has healed.  Your infection returns in the same area or another area.  You notice a swollen bump in the infected area.  You develop new symptoms. SEEK IMMEDIATE MEDICAL CARE IF:   You have a fever.  You feel very sleepy.  You develop vomiting or diarrhea.  You have a general ill feeling (malaise) with muscle aches and pains. MAKE SURE YOU:   Understand these instructions.  Will watch your condition.  Will get help right away if you are not doing well or get  worse. Document Released: 11/25/2004 Document Revised: 08/17/2011 Document Reviewed: 05/03/2011 [facility] Patient Information 2015 [place]. This information is not intended to replace advice given to you by your health care provider. Make sure you discuss any questions you have with your health care provider.

## 2013-09-07 NOTE — ED Provider Notes (Signed)
CSN: [id number]     Arrival date & time 09/07/13  1009 History   First MD Initiated Contact with Patient 09/07/13 1038     Chief Complaint  Patient presents with  . Cellulitis      HPI Comments: Patient discovered a small "pimple" on her left inner knee about one week ago.  No known insect bite or trauma.  The area has become increasingly red and tenderness to palpation.  No drainage from area.  Her Tdap is current.  Patient is a 73 y.o. female presenting with rash. The history is provided by the patient.  Rash Pain location: Left inner knee/thigh. Pain quality: aching   Pain radiates to:  Does not radiate Pain severity:  Mild Onset quality:  Gradual Duration:  1 week Timing:  Constant Progression:  Worsening Chronicity:  New Context comment:  Working outside Relieved by:  None tried Worsened by:  Nothing tried Ineffective treatments:  None tried Associated symptoms: chills, fatigue and nausea   Associated symptoms: no chest pain, no fever and no vomiting     Past Medical History  Diagnosis Date  . Back pain    Past Surgical History  Procedure Laterality Date  . Tubal ligation      bilateral  . Tonsillectomy     Family History  Problem Relation Age of Onset  . Alcohol abuse Father   . Cancer Brother    History  Substance Use Topics  . Smoking status: Never Smoker   . Smokeless tobacco: Not on file  . Alcohol Use: No   OB History   Grav Para Term Preterm Abortions TAB SAB Ect Mult Living                 Review of Systems  Constitutional: Positive for chills and fatigue. Negative for fever.  Cardiovascular: Negative for chest pain.  Gastrointestinal: Positive for nausea. Negative for vomiting.  Skin: Positive for rash.  All other systems reviewed and are negative.   Allergies  Clarithromycin  Home Medications   Prior to Admission medications   Medication Sig Start Date End Date Taking? Authorizing Provider  aspirin-acetaminophen-caffeine (EXCEDRIN  MIGRAINE) [phone] MG per tablet Take 1 tablet by mouth as needed.      Historical Provider, MD  betamethasone valerate ointment (VALISONE) 0.1 % Apply topically daily. 11/29/11   Agapito Games, MD  co-enzyme Q-10 30 MG capsule Take 1 capsule (30 mg total) by mouth daily. 09/08/10   Seymour Bars, DO  doxycycline (VIBRAMYCIN) 100 MG capsule Take 1 capsule (100 mg total) by mouth 2 (two) times daily. (Take with food) 09/07/13   Lattie Haw, MD  fexofenadine (ALLEGRA) 180 MG tablet Take 1 tablet (180 mg total) by mouth daily. 05/08/13   Agapito Games, MD  mupirocin ointment (BACTROBAN) 2 % Apply 1 application topically 3 (three) times daily. 09/07/13   Lattie Haw, MD  omega-3 acid ethyl esters (LOVAZA) 1 G capsule Take 2 capsules (2 g total) by mouth 2 (two) times daily. 05/08/13   Agapito Games, MD  ondansetron (ZOFRAN ODT) 4 MG disintegrating tablet Take one tab by mouth Q6hr prn nausea 09/07/13   Lattie Haw, MD  SUMAtriptan (IMITREX) 100 MG tablet TAKE AS DIRECTED AS NEEDED 05/08/13   Agapito Games, MD   BP 138/87  Pulse 72  Temp(Src) 98.5 F (36.9 C) (Oral)  Ht 5\' 6"  (1.676 m)  Wt 145 lb (65.772 kg)  BMI 23.41 kg/m2  SpO2 97% Physical  Exam  Nursing note and vitals reviewed. Constitutional: She is oriented to person, place, and time. She appears well-developed and well-nourished. No distress.  HENT:  Head: Normocephalic.  Eyes: Conjunctivae are normal. Pupils are equal, round, and reactive to light.  Neck: Neck supple.  Cardiovascular: Normal heart sounds.   Pulmonary/Chest: Breath sounds normal.  Musculoskeletal: She exhibits no edema.       Legs: Medial aspect of left knee has 5cm dia macular erythema around a central eschar.  Area is indurated and tender centrally but not fluctuant.  No drainage from lesion.  Neurological: She is alert and oriented to person, place, and time.  Skin: Skin is warm and dry.    ED Course  Procedures  none   MDM     1. Cellulitis of leg, left    Zofran 4mg  ODT administered in office.  Rx for same Begin doxycycline for staph coverage, and topical Bactroban. Apply warm compress 3 to 4 times daily. If symptoms become significantly worse during the night or over the weekend, proceed to the local emergency room.     Lattie HawStephen A Kiyaan Haq, MD 09/08/13 1038

## 2013-09-07 NOTE — ED Notes (Signed)
Cellulitis inside left knee x 6 days, red, painful, feeling nauseated

## 2014-02-06 DIAGNOSIS — H259 Unspecified age-related cataract: Secondary | ICD-10-CM | POA: Diagnosis not present

## 2014-07-31 ENCOUNTER — Encounter: Payer: Self-pay | Admitting: Family Medicine

## 2014-07-31 ENCOUNTER — Ambulatory Visit (INDEPENDENT_AMBULATORY_CARE_PROVIDER_SITE_OTHER): Payer: Medicare Other | Admitting: Family Medicine

## 2014-07-31 VITALS — BP 130/75 | HR 70 | Temp 98.4°F | Wt 148.0 lb

## 2014-07-31 DIAGNOSIS — J019 Acute sinusitis, unspecified: Secondary | ICD-10-CM

## 2014-07-31 MED ORDER — AMOXICILLIN-POT CLAVULANATE 875-125 MG PO TABS: 1.0000 | ORAL_TABLET | Freq: Two times a day (BID) | ORAL | Status: DC

## 2014-07-31 NOTE — Patient Instructions (Signed)

## 2014-07-31 NOTE — Progress Notes (Signed)
   Subjective:    Patient ID: Christy Gutierrez, female    DOB: [birth year], 74 y.o.   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  Started with a ST and cough. Now HA, pressure behind her eye, nasal congestion that is sometimes geen and orange. Ears feel full. Taking claritin and Allegra. No fever, chills or sweats. Being around hay usually flares her allergies but usually feels better afrer a few days.    Review of Systems     Objective:   Physical Exam  Constitutional: She is oriented to person, place, and time. She appears well-developed and well-nourished.  HENT:  Head: Normocephalic and atraumatic.  Right Ear: External ear normal.  Left Ear: External ear normal.  Nose: Nose normal.  Mouth/Throat: Oropharynx is clear and moist.  TMs and canals are clear.   Eyes: Conjunctivae and EOM are normal. Pupils are equal, round, and reactive to light.  Neck: Neck supple. No thyromegaly present.  Cardiovascular: Normal rate, regular rhythm and normal heart sounds.   Pulmonary/Chest: Effort normal and breath sounds normal. She has no wheezes.  Lymphadenopathy:    She has no cervical adenopathy.  Neurological: She is alert and oriented to person, place, and time.  Skin: Skin is warm and dry.  Psychiatric: She has a normal mood and affect.          Assessment & Plan:  Acute sinsusitis - treat with Augmentin. Cough not better in one week. Make sure staying hydrated. Okay to continue and histamines if needed.

## 2014-09-13 IMAGING — DX DG DXA BONE DENSITY STUDY HL7
4 series · 4 of 4 positions shown · non-contrast
Comparison: None.

CLINICAL DATA: Postmenopausal osteoporosis screening.

EXAM:
DUAL X-RAY ABSORPTIOMETRY (DXA) FOR BONE MINERAL DENSITY

[view not recorded (1 of 4)]
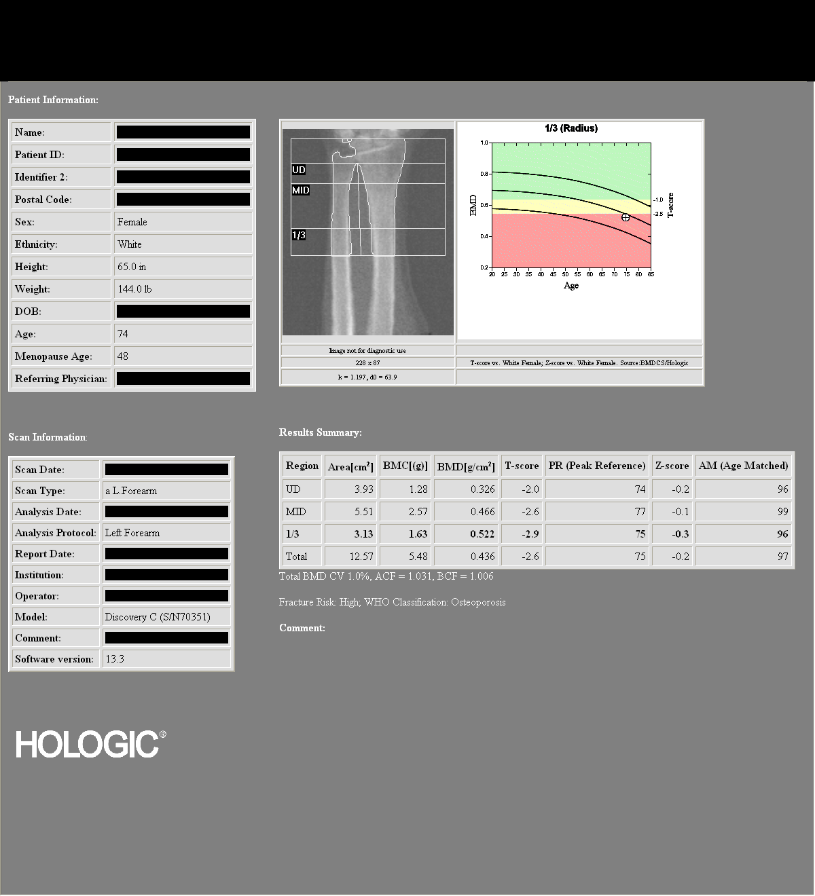

[view not recorded (2 of 4)]
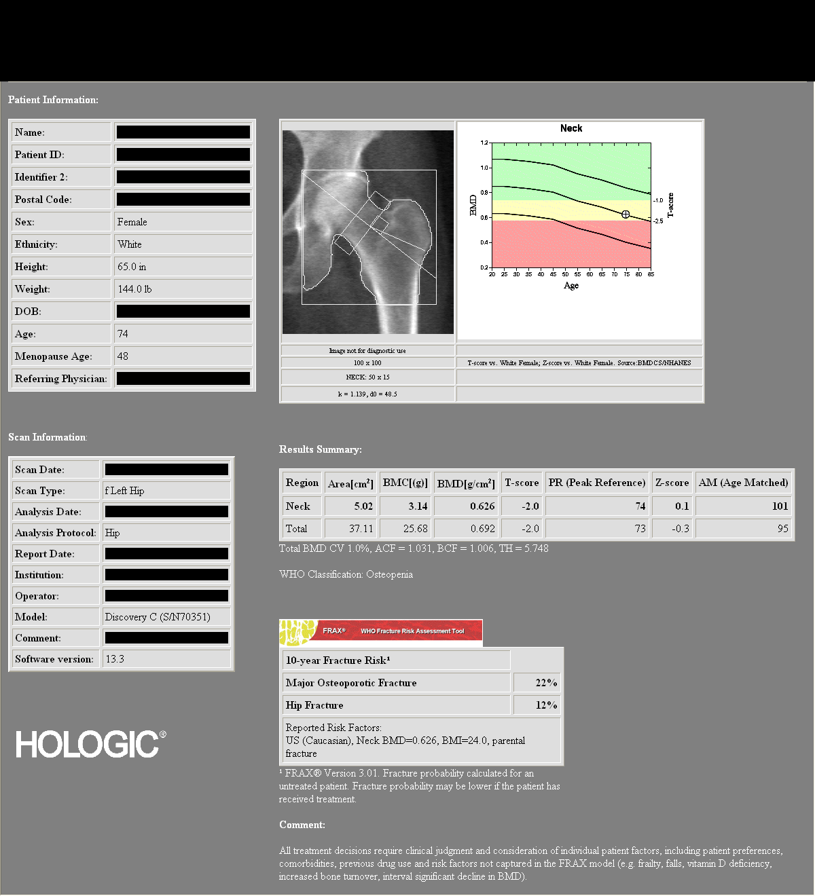

[view not recorded (3 of 4)]
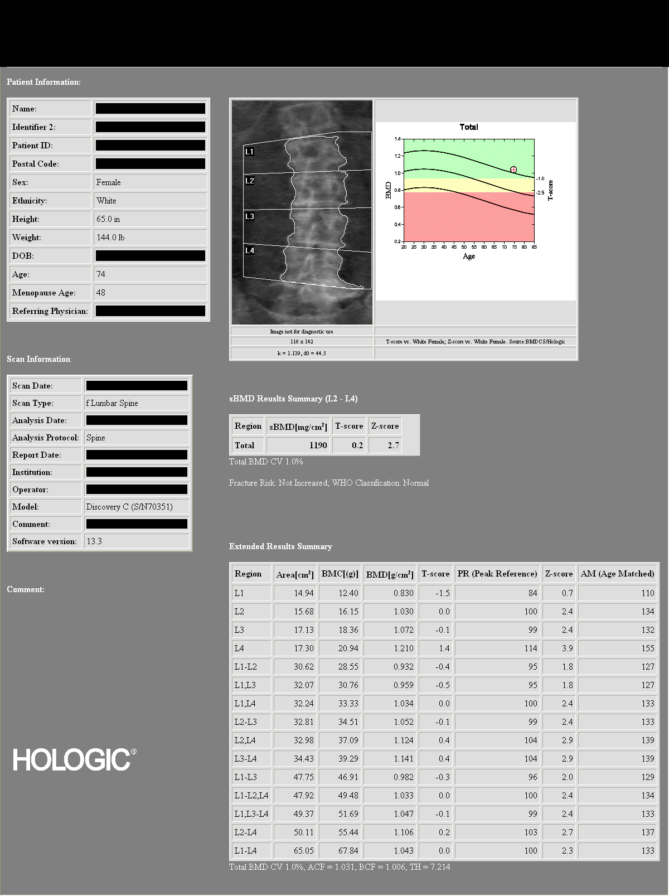

[view not recorded (4 of 4)]
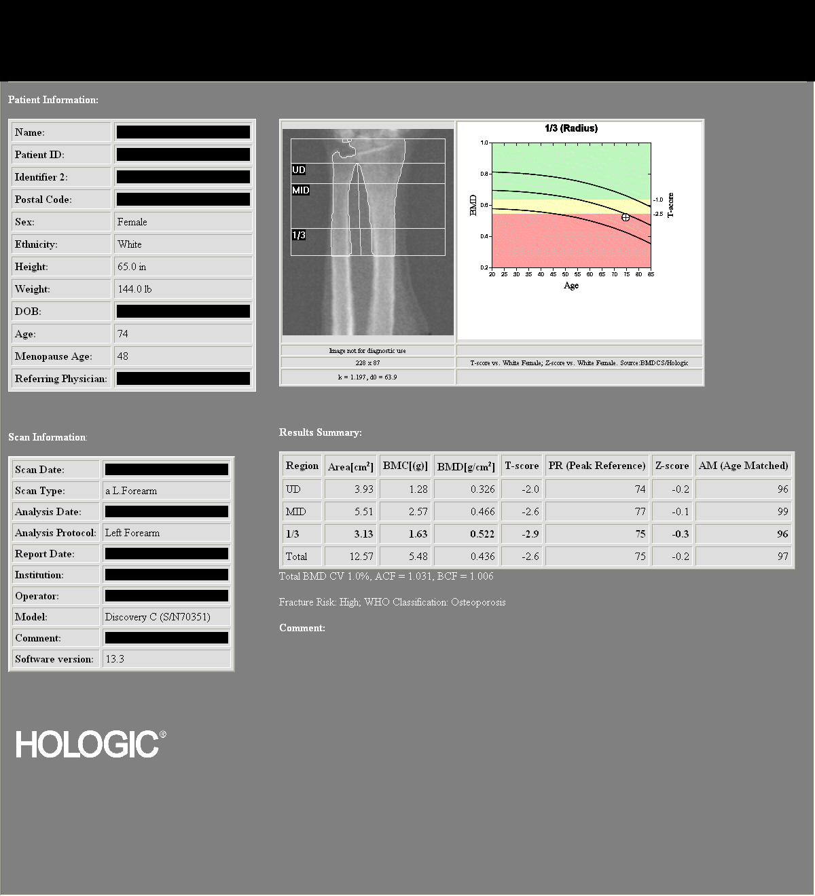

[4 of 4 positions shown; findings below may reference images not displayed]

FINDINGS: AP LUMBAR SPINE L2-L3 (moderate degenerative changes with mild
curvature convex left present).

Bone Mineral Density (BMD):  1.052 g/cm2

Young Adult T-Score:  -0.1

Z-Score:

Left FEMUR neck

Bone Mineral Density (BMD):  0.626 g/cm2

Young Adult T-Score: -2.0

Z-Score:

Left  FOREARM ([DATE] RADIUS)

Bone Mineral Density (BMD):  0.5-2

Young Adult T Score:  -2.9

Z Score:  -0.3

ASSESSMENT: Patient's diagnostic category is OSTEOPOROSIS by WHO
Criteria.

FRACTURE RISK: INCREASED

FRAX:  Not calculated due to T-score at or below -2.5.
Effective therapies are available in the form of bisphosphonates,
selective estrogen receptor modulators, biologic agents, and hormone
replacement therapy (for women). All patients should ensure an
adequate intake of dietary calcium (5377 mg daily) and vitamin D
([address]) unless contraindicated.

All treatment decisions require clinical judgment and consideration
of individual patient factors, including patient preferences,
co-morbidities, previous drug use, risk factors not captured in the
FRAX model (e.g., frailty, falls, vitamin D deficiency, increased
bone turnover, interval significant decline in bone density) and
possible under- or over-estimation of fracture risk by FRAX.

The National Osteoporosis Foundation recommends that FDA-approved
medical therapies be considered in postmenopausal women and men age
50 or older with a:

1. Hip or vertebral (clinical or morphometric) fracture.

2. T-score of -2.5 or lower at the spine or hip.

3. Ten-year fracture probability by FRAX of 3% or greater for hip
fracture or 20% or greater for major osteoporotic fracture.

People with diagnosed cases of osteoporosis or at high risk for
fracture should have regular bone mineral density tests. For
patients eligible for Medicare, routine testing is allowed once
every 2 years. The testing frequency can be increased to one year
for patients who have rapidly progressing disease, those who are
receiving or discontinuing medical therapy to restore bone mass, or
have additional risk factors.

World Health Organization (WHO) Criteria:

Normal: T-scores from +1.0 to -1.0

Low Bone Mass (Osteopenia): T-scores between -1.0 and -2.5

Osteoporosis: T-scores -2.5 and below

Comparison to Reference Population:

T-score is the key measure used in the diagnosis of osteoporosis and
relative risk determination for fracture. It provides a value for
bone mass relative to the mean bone mass of a young adult reference
population expressed in terms of standard deviation (SD).

Z-score is the age-matched score showing the patient's values
compared to a population matched for age, sex, and race. This is
also expressed in terms of standard deviation. The patient may have
values that compare favorably to the age-matched values and still be
at increased risk for fracture.

## 2014-10-23 ENCOUNTER — Ambulatory Visit: Payer: Medicare Other | Admitting: Family Medicine

## 2014-10-29 ENCOUNTER — Ambulatory Visit (INDEPENDENT_AMBULATORY_CARE_PROVIDER_SITE_OTHER): Payer: Medicare Other | Admitting: Family Medicine

## 2014-10-29 ENCOUNTER — Encounter: Payer: Self-pay | Admitting: Family Medicine

## 2014-10-29 VITALS — BP 141/82 | HR 75 | Ht 66.0 in | Wt 148.0 lb

## 2014-10-29 DIAGNOSIS — J309 Allergic rhinitis, unspecified: Secondary | ICD-10-CM | POA: Diagnosis not present

## 2014-10-29 DIAGNOSIS — G43009 Migraine without aura, not intractable, without status migrainosus: Secondary | ICD-10-CM

## 2014-10-29 DIAGNOSIS — R21 Rash and other nonspecific skin eruption: Secondary | ICD-10-CM | POA: Diagnosis not present

## 2014-10-29 DIAGNOSIS — E785 Hyperlipidemia, unspecified: Secondary | ICD-10-CM

## 2014-10-29 MED ORDER — FEXOFENADINE HCL 180 MG PO TABS: 180.0000 mg | ORAL_TABLET | Freq: Every day | ORAL | Status: DC

## 2014-10-29 MED ORDER — BETAMETHASONE VALERATE 0.1 % EX OINT: TOPICAL_OINTMENT | Freq: Every day | CUTANEOUS | Status: DC

## 2014-10-29 MED ORDER — OMEGA-3-ACID ETHYL ESTERS 1 G PO CAPS: 2.0000 g | ORAL_CAPSULE | Freq: Two times a day (BID) | ORAL | Status: DC

## 2014-10-29 MED ORDER — SUMATRIPTAN SUCCINATE 100 MG PO TABS: ORAL_TABLET | ORAL | Status: DC

## 2014-10-29 MED ORDER — ONDANSETRON 4 MG PO TBDP: ORAL_TABLET | ORAL | Status: DC

## 2014-10-29 NOTE — Progress Notes (Signed)
   Subjective:    Patient ID: Christy Gutierrez, female    DOB: August 13, 1940, 74 y.o.   MRN: [id number]  HPI  Hyperlipidemia- no CP or SOB. On lovaza.  Tolerates well.   Migraine headaches- no aura.  Says they are much better than they were years ago. Usually uses Excedrin for more mild HA. If moe severe then will last for 3 days and will use her imitrexshe gets about 15 HA per month but says the "migrains" are infrequent. Feels like hers are triggered by stress.  Says doesn't really affect her daily activities.  Says the imitrex is very effective.   AR- says she takes allegra daily and works well.  Nees a Rx for it.     she tends to get a red rash under both breasts with some bumps. She says the topical steroid cream clears it up immediately and would like a refill on this.   Review of Systems     Objective:   Physical Exam  Constitutional: She is oriented to person, place, and time. She appears well-developed and well-nourished.  HENT:  Head: Normocephalic and atraumatic.  Cardiovascular: Normal rate, regular rhythm and normal heart sounds.   Pulmonary/Chest: Effort normal and breath sounds normal.  Neurological: She is alert and oriented to person, place, and time.  Skin: Skin is warm and dry.  Psychiatric: She has a normal mood and affect. Her behavior is normal.          Assessment & Plan:  Migraine Headaches -  Not well controlled. Sounds like she has more palms with daily chronic headache that are more mild than she does frequency of migraines per se. We discussed prophylactic medication but she is not interested at this time.  Hyperlipidemia -  We'll continue with low fossa. Progression sent to pharmacy. Due to recheck lipid levels.  Lab Slip provided today. She will go when she is fasting.   allergic rhinitis-will refill Allegra and printed the prescription so she get it covered with her insurance.   rash- I'll refill topical steroid cream.  Declined flu shot and pneumonia  vaccine.   We discussed trying ot find some joy in her life.  She seems down today.

## 2014-11-21 DIAGNOSIS — E785 Hyperlipidemia, unspecified: Secondary | ICD-10-CM | POA: Diagnosis not present

## 2014-11-22 LAB — COMPLETE METABOLIC PANEL WITH GFR
ALBUMIN: 4.2 g/dL (ref 3.6–5.1)
ALT: 20 U/L (ref 6–29)
AST: 26 U/L (ref 10–35)
Alkaline Phosphatase: 55 U/L (ref 33–130)
BUN: 13 mg/dL (ref 7–25)
CHLORIDE: 104 mmol/L (ref 98–110)
CO2: 30 mmol/L (ref 20–31)
Calcium: 9 mg/dL (ref 8.6–10.4)
Creat: 0.62 mg/dL (ref 0.60–0.93)
GFR, EST NON AFRICAN AMERICAN: 88 mL/min (ref >=60)
GFR, Est African American: >89 mL/min (ref >=60)
GLUCOSE: 84 mg/dL (ref 65–99)
Potassium: 4.2 mmol/L (ref 3.5–5.3)
SODIUM: 142 mmol/L (ref 135–146)
Total Bilirubin: 0.7 mg/dL (ref 0.2–1.2)
Total Protein: 6.4 g/dL (ref 6.1–8.1)

## 2014-11-22 LAB — LIPID PANEL
Cholesterol: 207 mg/dL — ABNORMAL HIGH (ref 125–200)
HDL: 82 mg/dL (ref >=46)
LDL Cholesterol: 115 mg/dL (ref <130)
Total CHOL/HDL Ratio: 2.5 Ratio (ref <=5.0)
Triglycerides: 52 mg/dL (ref <150)
VLDL: 10 mg/dL (ref <30)

## 2015-02-10 DIAGNOSIS — H2513 Age-related nuclear cataract, bilateral: Secondary | ICD-10-CM | POA: Diagnosis not present

## 2015-02-28 ENCOUNTER — Ambulatory Visit (INDEPENDENT_AMBULATORY_CARE_PROVIDER_SITE_OTHER): Payer: Medicare Other | Admitting: Sports Medicine

## 2015-02-28 DIAGNOSIS — H6991 Unspecified Eustachian tube disorder, right ear: Secondary | ICD-10-CM | POA: Insufficient documentation

## 2015-02-28 DIAGNOSIS — H6981 Other specified disorders of Eustachian tube, right ear: Secondary | ICD-10-CM | POA: Insufficient documentation

## 2015-02-28 MED ORDER — PREDNISONE 50 MG PO TABS: 50.0000 mg | ORAL_TABLET | Freq: Every day | ORAL | Status: DC

## 2015-02-28 MED ORDER — FLUTICASONE PROPIONATE 50 MCG/ACT NA SUSP: NASAL | Status: DC

## 2015-02-28 NOTE — Assessment & Plan Note (Signed)
Flonase, prednisone, oral decongestants, return as needed.

## 2015-02-28 NOTE — Progress Notes (Signed)
  Subjective:    CC: follow-up  HPI: This is a pleasant 74 year old female with a several week history of popping and pain as well as pressure in her right ear, [place] her son is an equestrian judge. Unfortunately she's had pain and pressure behind her right ear, with difficulty equalizing the pressure, no ringing. No constitutional symptoms, mild upper respiratory symptoms.  Past medical history, Surgical history, Family history not pertinant except as noted below, Social history, Allergies, and medications have been entered into the medical record, reviewed, and no changes needed.   Review of Systems: No fevers, chills, night sweats, weight loss, chest pain, or shortness of breath.   Objective:    General: Well Developed, well nourished, and in no acute distress.  Neuro: Alert and oriented x3, extra-ocular muscles intact, sensation grossly intact.  HEENT: Normocephalic, atraumatic, pupils equal round reactive to light, neck supple, no masses, no lymphadenopathy, thyroid nonpalpable. Oropharynx, nasopharynx, ear canals unremarkable. Skin: Warm and dry, no rashes. Cardiac: Regular rate and rhythm, no murmurs rubs or gallops, no lower extremity edema.  Respiratory: Clear to auscultation bilaterally. Not using accessory muscles, speaking in full sentences.  Impression and Recommendations:

## 2015-02-28 NOTE — Patient Instructions (Signed)
Barotitis Media Barotitis media is inflammation of your middle ear. This occurs when the auditory tube (eustachian tube) leading from the back of your nose (nasopharynx) to your eardrum is blocked. This blockage may result from a cold, environmental allergies, or an upper respiratory infection. Unresolved barotitis media may lead to damage or hearing loss (barotrauma), which may become permanent. HOME CARE INSTRUCTIONS   Use medicines as recommended by your health care provider. Over-the-counter medicines will help unblock the canal and can help during times of air travel.  Do not put anything into your ears to clean or unplug them. Eardrops will not be helpful.  Do not swim, dive, or fly until your health care provider says it is all right to do so. If these activities are necessary, chewing gum with frequent, forceful swallowing may help. It is also helpful to hold your nose and gently blow to pop your ears for equalizing pressure changes. This forces air into the eustachian tube.  Only take over-the-counter or prescription medicines for pain, discomfort, or fever as directed by your health care provider.  A decongestant may be helpful in decongesting the middle ear and make pressure equalization easier. SEEK MEDICAL CARE IF:  You experience a serious form of dizziness in which you feel as if the room is spinning and you feel nauseated (vertigo).  Your symptoms only involve one ear. SEEK IMMEDIATE MEDICAL CARE IF:   You develop a severe headache, dizziness, or severe ear pain.  You have bloody or pus-like drainage from your ears.  You develop a fever.  Your problems do not improve or become worse. MAKE SURE YOU:   Understand these instructions.  Will watch your condition.  Will get help right away if you are not doing well or get worse.   This information is not intended to replace advice given to you by your health care provider. Make sure you discuss any questions you have with  your health care provider.   Document Released: 02/13/2000 Document Revised: 12/06/2012 Document Reviewed: 09/12/2012 Elsevier Interactive Patient Education 2016 Else[employer] Inc[employer]  

## 2015-03-11 ENCOUNTER — Telehealth: Payer: Self-pay

## 2015-03-11 NOTE — Telephone Encounter (Signed)
Runny nose, chest congestion can last a few weeks, use over-the-counter decongestants and antihistamines for now.

## 2015-03-11 NOTE — Telephone Encounter (Signed)
Patient advised.

## 2015-03-11 NOTE — Telephone Encounter (Signed)
Christy Gutierrez states her right ear is better. Now she has a lot of congestion in her chest and runny nose with clear drainage. She wanted to know if she needs an antibiotic.

## 2015-04-02 ENCOUNTER — Ambulatory Visit (INDEPENDENT_AMBULATORY_CARE_PROVIDER_SITE_OTHER): Payer: Medicare Other

## 2015-04-02 ENCOUNTER — Encounter: Payer: Self-pay | Admitting: Family Medicine

## 2015-04-02 ENCOUNTER — Ambulatory Visit (INDEPENDENT_AMBULATORY_CARE_PROVIDER_SITE_OTHER): Payer: Medicare Other | Admitting: Family Medicine

## 2015-04-02 VITALS — BP 171/80 | HR 78 | Wt 145.0 lb

## 2015-04-02 DIAGNOSIS — I739 Peripheral vascular disease, unspecified: Secondary | ICD-10-CM

## 2015-04-02 DIAGNOSIS — M4186 Other forms of scoliosis, lumbar region: Secondary | ICD-10-CM

## 2015-04-02 DIAGNOSIS — M47816 Spondylosis without myelopathy or radiculopathy, lumbar region: Secondary | ICD-10-CM | POA: Insufficient documentation

## 2015-04-02 DIAGNOSIS — M25562 Pain in left knee: Secondary | ICD-10-CM | POA: Diagnosis not present

## 2015-04-02 DIAGNOSIS — M5442 Lumbago with sciatica, left side: Secondary | ICD-10-CM | POA: Diagnosis not present

## 2015-04-02 DIAGNOSIS — M179 Osteoarthritis of knee, unspecified: Secondary | ICD-10-CM | POA: Diagnosis not present

## 2015-04-02 DIAGNOSIS — M25561 Pain in right knee: Secondary | ICD-10-CM

## 2015-04-02 DIAGNOSIS — M5136 Other intervertebral disc degeneration, lumbar region: Secondary | ICD-10-CM

## 2015-04-02 DIAGNOSIS — M5440 Lumbago with sciatica, unspecified side: Secondary | ICD-10-CM | POA: Insufficient documentation

## 2015-04-02 MED ORDER — NAPROXEN 500 MG PO TABS: 500.0000 mg | ORAL_TABLET | Freq: Two times a day (BID) | ORAL | Status: DC

## 2015-04-02 MED ORDER — DICLOFENAC SODIUM 1 % TD GEL: 4.0000 g | Freq: Four times a day (QID) | TRANSDERMAL | Status: AC

## 2015-04-02 MED ORDER — METHOCARBAMOL 500 MG PO TABS: 500.0000 mg | ORAL_TABLET | Freq: Three times a day (TID) | ORAL | Status: DC | PRN

## 2015-04-02 NOTE — Assessment & Plan Note (Signed)
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 Plan for watchful waiting. If not better consider PT/MRI.

## 2015-04-02 NOTE — Progress Notes (Signed)
Quick Note:  Xray knee shows arthritis mild ______

## 2015-04-02 NOTE — Patient Instructions (Signed)
Thank you for coming in today. Take naproxen for pain as needed. Use sparingly.  Use topical voltaren gel as needed on the knee.  Take robaxin (methocarbamol) sparingly for muscle spasms.  Return in a week or 2 if not better.  Come back or go to the emergency room if you notice new weakness new numbness problems walking or bowel or bladder problems.

## 2015-04-02 NOTE — Progress Notes (Signed)
Quick Note:  Xray back shows lots of arthritis ______

## 2015-04-02 NOTE — Progress Notes (Signed)
   Subjective:    I'm seeing this patient as a consultation for:  Dr Linford Arnold  CC: Back and left knee pain  HPI: Patient has a 35 year history of chronic intermittent back pain as result of an injury sustained at work. Typically she does quite well with home exercises and can perform all of her normal activities of daily living and normal exercise activities without pain. Sometimes she'll occasionally take a muscle relaxer intermittently which is often help. However she was in her normal state of health until a few days ago. She was shoveling dirt at home in her driveway about a week ago. That evening she felt significant left-sided low back pain radiating to the left lateral lower leg. Additionally she notes moderate to severe left anterior and lateral knee pain. The pain has been persistent despite lidocaine patches which typically will help. She is unable to walk normally because of the pain. She denies any weakness or bowel bladder dysfunction. She denies any fevers chills nausea vomiting or diarrhea. She denies any specific injury. Her knee pain is worse with motion and her radiating back pain is worse with extension. She notes that she is able to flex and pick up for example ice cubes off the floor without any pain.  Past medical history, Surgical history, Family history not pertinant except as noted below, Social history, Allergies, and medications have been entered into the medical record, reviewed, and no changes needed.   Review of Systems: No headache, visual changes, nausea, vomiting, diarrhea, constipation, dizziness, abdominal pain, skin rash, fevers, chills, night sweats, weight loss, swollen lymph nodes, body aches, joint swelling, muscle aches, chest pain, shortness of breath, mood changes, visual or auditory hallucinations.   Objective:    Filed Vitals:   04/02/15 1025  BP: 171/80  Pulse: 78   General: Well Developed, well nourished, and in no acute distress.  Neuro/Psych:  Alert and oriented x3, extra-ocular muscles intact, able to move all 4 extremities, sensation grossly intact. Skin: Warm and dry, no rashes noted.  Respiratory: Not using accessory muscles, speaking in full sentences, trachea midline.  Cardiovascular: Pulses palpable, no extremity edema. Abdomen: Does not appear distended. MSK: Back is nontender to spinal midline. Range of motion normal flexion significantly impaired extension normal rotation. Negative straight leg raise test and slump test bilaterally. Reflexes right leg are normal and diminished left knee normal left ankle. Sensation is intact throughout.  Left knee normal-appearing that effusion or skin changes. Tender to palpation distal lateral femur and lateral joint line. Normal range of motion with 2+ retropatellar crepitations. Positive lateral McMurray's test negative medial. Negative valgus and varus stress. Normal anterior posterior drawer.  Painful gait  Xray Lspine and knee pending.   No results found for this or any previous visit (from the past 24 hour(s)). No results found.  Impression and Recommendations:   This case required medical decision making of moderate complexity.

## 2015-04-02 NOTE — Assessment & Plan Note (Signed)
Likely due to tendonitis/oversue strain. Plan for oral naproxen, topical voltaren and rest.  Return in 1-2 weeks if not better.

## 2015-10-27 ENCOUNTER — Other Ambulatory Visit: Payer: Self-pay | Admitting: Family Medicine

## 2015-12-08 ENCOUNTER — Telehealth: Payer: Self-pay | Admitting: Family Medicine

## 2015-12-08 NOTE — Telephone Encounter (Signed)
I called pt to let her know she is due for a f/u appt with Dr. Linford ArnoldMetheney on her Cholesterol and she states her son in law has cancer and she has to find time to call back and schedule an appt that will work.

## 2016-01-26 ENCOUNTER — Encounter: Payer: Self-pay | Admitting: Family Medicine

## 2016-01-26 ENCOUNTER — Ambulatory Visit (INDEPENDENT_AMBULATORY_CARE_PROVIDER_SITE_OTHER): Payer: Medicare Other | Admitting: Family Medicine

## 2016-01-26 VITALS — BP 149/67 | HR 76 | Ht 66.0 in | Wt 144.0 lb

## 2016-01-26 DIAGNOSIS — J302 Other seasonal allergic rhinitis: Secondary | ICD-10-CM | POA: Diagnosis not present

## 2016-01-26 DIAGNOSIS — M5442 Lumbago with sciatica, left side: Secondary | ICD-10-CM

## 2016-01-26 DIAGNOSIS — R030 Elevated blood-pressure reading, without diagnosis of hypertension: Secondary | ICD-10-CM | POA: Diagnosis not present

## 2016-01-26 DIAGNOSIS — J019 Acute sinusitis, unspecified: Secondary | ICD-10-CM

## 2016-01-26 DIAGNOSIS — G8929 Other chronic pain: Secondary | ICD-10-CM

## 2016-01-26 MED ORDER — FEXOFENADINE HCL 180 MG PO TABS: 180.0000 mg | ORAL_TABLET | Freq: Every day | ORAL | 3 refills | Status: DC

## 2016-01-26 MED ORDER — OMEGA-3-ACID ETHYL ESTERS 1 G PO CAPS: 2.0000 g | ORAL_CAPSULE | Freq: Two times a day (BID) | ORAL | 3 refills | Status: DC

## 2016-01-26 MED ORDER — AMOXICILLIN-POT CLAVULANATE 875-125 MG PO TABS: 1.0000 | ORAL_TABLET | Freq: Two times a day (BID) | ORAL | 0 refills | Status: DC

## 2016-01-26 NOTE — Patient Instructions (Addendum)

## 2016-01-26 NOTE — Progress Notes (Signed)
Subjective:    Patient ID: Christy Gutierrez, female    DOB: [birth year], 75 y.o.   MRN: [id number]  HPI C/O of sinus congestion and drainage x 1 month.  Worse over the last several weeks with yellow mucous.  Taking Allegra. [place] and [place].  No fever, chills, etc.       Allergic rhinitis-she does take Allegra daily year round. She does need a print prescriptions that she can get reimbursed.  Chronic back pain with radiculopathy-she says that her previous orthopedist has retired. She really doesn't have a copy of records and she was actually on disability back in 1992 for her back. She wants to know who she can follow-up for for that. She's not interested in any type of surgery.  Review of Systems   BP (!) 149/67   Pulse 76   Ht 5\' 6"  (1.676 m)   Wt 144 lb (65.3 kg)   SpO2 100%   BMI 23.24 kg/m     Allergies  Allergen Reactions  . Clarithromycin     Past Medical History:  Diagnosis Date  . Back pain     Past Surgical History:  Procedure Laterality Date  . TONSILLECTOMY    . TUBAL LIGATION     bilateral    Social History   Social History  . Marital status: Widowed    Spouse name: N/A  . Number of children: N/A  . Years of education: N/A   Occupational History  . Not on file.   Social History Main Topics  . Smoking status: Never Smoker  . Smokeless tobacco: Not on file  . Alcohol use No  . Drug use: Unknown  . Sexual activity: Not on file   Other Topics Concern  . Not on file   Social History Narrative  . No narrative on file    Family History  Problem Relation Age of Onset  . Alcohol abuse Father   . Cancer Brother     Outpatient Encounter Prescriptions as of 01/26/2016  Medication Sig  . aspirin-acetaminophen-caffeine (EXCEDRIN MIGRAINE) 250-250-65 MG per tablet Take 1 tablet by mouth as needed.    Marland Kitchen. co-enzyme Q-10 30 MG capsule Take 1 capsule (30 mg total) by mouth daily.  . diclofenac sodium (VOLTAREN) 1 % GEL Apply  4 g topically 4 (four) times daily. To affected joint.  . fexofenadine (ALLEGRA) 180 MG tablet Take 1 tablet (180 mg total) by mouth daily.  . fluticasone (FLONASE) 50 MCG/ACT nasal spray One spray in each nostril twice a day, use left hand for right nostril, and right hand for left nostril.  Marland Kitchen. omega-3 acid ethyl esters (LOVAZA) 1 g capsule Take 2 capsules (2 g total) by mouth 2 (two) times daily.  . SUMAtriptan (IMITREX) 100 MG tablet TAKE AS DIRECTED AS NEEDED  . [DISCONTINUED] fexofenadine (ALLEGRA) 180 MG tablet Take 1 tablet (180 mg total) by mouth daily.  . [DISCONTINUED] omega-3 acid ethyl esters (LOVAZA) 1 g capsule Take 1 capsule (1 g total) by mouth 2 (two) times daily.  . [DISCONTINUED] omega-3 acid ethyl esters (LOVAZA) 1 g capsule Take 2 capsules (2 g total) by mouth 2 (two) times daily.  Marland Kitchen. amoxicillin-clavulanate (AUGMENTIN) 875-125 MG tablet Take 1 tablet by mouth 2 (two) times daily.  . [DISCONTINUED] betamethasone valerate ointment (VALISONE) 0.1 % Apply topically daily.  . [DISCONTINUED] methocarbamol (ROBAXIN) 500 MG tablet Take 1 tablet (500 mg total) by mouth every 8 (eight) hours as needed  for muscle spasms.  . [DISCONTINUED] naproxen (NAPROSYN) 500 MG tablet Take 1 tablet (500 mg total) by mouth 2 (two) times daily with a meal.   No facility-administered encounter medications on file as of 01/26/2016.           Objective:   Physical Exam  Constitutional: She is oriented to person, place, and time. She appears well-developed and well-nourished.  HENT:  Head: Normocephalic and atraumatic.  Right Ear: External ear normal.  Left Ear: External ear normal.  Nose: Nose normal.  Mouth/Throat: Oropharynx is clear and moist.  TMs and canals are clear.   Eyes: Conjunctivae and EOM are normal. Pupils are equal, round, and reactive to light.  Neck: Neck supple. No thyromegaly present.  Cardiovascular: Normal rate, regular rhythm and normal heart sounds.   Pulmonary/Chest:  Effort normal and breath sounds normal. She has no wheezes.  Lymphadenopathy:    She has no cervical adenopathy.  Neurological: She is alert and oriented to person, place, and time.  Skin: Skin is warm and dry.  Psychiatric: She has a normal mood and affect.        Assessment & Plan:  Acute sinusitis - Will treat with Augmentin. Call if not significantly better by the end of the week. Okay to use Tylenol or ibuprofen as needed for fever or aches or pains. Okay to continue Chloraseptic spray.  Allergic rhinitis-printed prescription for Allegra.  Elevated blood pressure-her blood pressure is high today and the last time that she was here. She wants to try increasing her Lovaza for the next couple weeks first. She'll then come back in for nurse blood pressure visit. If it's still high at that point and we will start a low-dose ACE inhibitor. We had a full discussion about this today.  Chronic back pain-encouraged her to schedule an appointment with one of our sports medicine providers

## 2016-02-05 ENCOUNTER — Other Ambulatory Visit: Payer: Self-pay

## 2016-02-26 DIAGNOSIS — H25013 Cortical age-related cataract, bilateral: Secondary | ICD-10-CM | POA: Diagnosis not present

## 2016-03-05 ENCOUNTER — Encounter: Payer: Self-pay | Admitting: Family Medicine

## 2016-03-05 ENCOUNTER — Ambulatory Visit (INDEPENDENT_AMBULATORY_CARE_PROVIDER_SITE_OTHER): Payer: Medicare Other | Admitting: Family Medicine

## 2016-03-05 VITALS — BP 142/82

## 2016-03-05 DIAGNOSIS — G8929 Other chronic pain: Secondary | ICD-10-CM

## 2016-03-05 DIAGNOSIS — I10 Essential (primary) hypertension: Secondary | ICD-10-CM | POA: Diagnosis not present

## 2016-03-05 DIAGNOSIS — M25562 Pain in left knee: Secondary | ICD-10-CM | POA: Diagnosis not present

## 2016-03-05 MED ORDER — LISINOPRIL 10 MG PO TABS: 10.0000 mg | ORAL_TABLET | Freq: Every day | ORAL | 0 refills | Status: DC

## 2016-03-05 NOTE — Progress Notes (Signed)
   Subjective:    Patient ID: Christy Gutierrez, female    DOB: 05/20/1940, 76 y.o.   MRN: [id number]  HPI Here to f/u on BP. Says it was high when she went to the eye doc and was high when here last OV. She was supposed to follow-up. She says this is been a very stressful year. She is by herself and having to do a lot on her own. And her daughter's husband passed away suddenly 2 months ago so she's been helping her daughter and her granddaughter. Thinks this might be contributing to her blood pressure being up more recently. No chest pain shortness of breath or palpitations. She's not currently on any medication for it. She had been using diclofenac gel for her arthritis back she stopped it last Friday just to make sure it was not affecting her blood pressure.   Review of Systems     Objective:   Physical Exam  Constitutional: She is oriented to person, place, and time. She appears well-developed and well-nourished.  HENT:  Head: Normocephalic and atraumatic.  Cardiovascular: Normal rate, regular rhythm and normal heart sounds.   Pulmonary/Chest: Effort normal and breath sounds normal.  Neurological: She is alert and oriented to person, place, and time.  Skin: Skin is warm and dry.  Psychiatric: She has a normal mood and affect. Her behavior is normal.       Assessment & Plan:  Essential HTN - new Dx.  New start lisinopril. Discussed potential for S.E.  Go for BMP in one week.   Osteoarthritis hip and knee-Okay to restart her diclofenac gel. Reassured her that I do not feel that it is affecting her blood pressure.

## 2016-03-05 NOTE — Patient Instructions (Signed)
Go to the lab in one week after you start the medicine.

## 2016-03-16 DIAGNOSIS — I10 Essential (primary) hypertension: Secondary | ICD-10-CM | POA: Diagnosis not present

## 2016-03-17 LAB — BASIC METABOLIC PANEL WITH GFR
BUN: 15 mg/dL (ref 7–25)
CALCIUM: 9.3 mg/dL (ref 8.6–10.4)
CHLORIDE: 106 mmol/L (ref 98–110)
CO2: 28 mmol/L (ref 20–31)
Creat: 0.67 mg/dL (ref 0.60–0.93)
GFR, EST NON AFRICAN AMERICAN: 85 mL/min (ref >=60)
GFR, Est African American: >89 mL/min (ref >=60)
Glucose, Bld: 94 mg/dL (ref 65–99)
Potassium: 4.3 mmol/L (ref 3.5–5.3)
SODIUM: 140 mmol/L (ref 135–146)

## 2016-03-17 NOTE — Progress Notes (Signed)
All labs are normal. 

## 2016-03-26 ENCOUNTER — Ambulatory Visit (INDEPENDENT_AMBULATORY_CARE_PROVIDER_SITE_OTHER): Payer: Medicare Other | Admitting: Family Medicine

## 2016-03-26 VITALS — BP 158/73 | HR 72 | Resp 16

## 2016-03-26 DIAGNOSIS — I10 Essential (primary) hypertension: Secondary | ICD-10-CM | POA: Diagnosis not present

## 2016-03-26 MED ORDER — LISINOPRIL 20 MG PO TABS: 20.0000 mg | ORAL_TABLET | Freq: Every day | ORAL | 0 refills | Status: DC

## 2016-03-26 NOTE — Patient Instructions (Signed)
Go to the lab. Start taking Lisinopril 20 mg. You can take 2 tablets of the 10 mg daily. Lisinopril 20 mg sent to pharmacy. Follow up in 2 weeks for a blood pressure check on the nurse schedule.

## 2016-03-26 NOTE — Progress Notes (Signed)
   Subjective:    Patient ID: Christy Gutierrez, female    DOB: 1940/11/24, 76 y.o.   MRN: [id number]  Christy Gutierrez is here to follow up on blood pressure. She did start the Lisinopril 10 mg once daily. She has had about 2 episodes of headaches over the last 3 weeks and is less than before the start of Lisinopril. Denies any medication problems.    Hypertension  This is a new problem. The current episode started 1 to 4 weeks ago. The problem is uncontrolled. Associated symptoms include headaches. Pertinent negatives include no blurred vision, chest pain, malaise/fatigue, palpitations, peripheral edema, shortness of breath or sweats.      Review of Systems  Constitutional: Negative for malaise/fatigue.  Eyes: Negative for blurred vision.  Respiratory: Negative for shortness of breath.   Cardiovascular: Negative for chest pain and palpitations.  Neurological: Positive for headaches.       Objective:   Physical Exam        Assessment & Plan:  Hypertension - BMP today. Increase Lisinopril to 20 mg daily, per Dr Linford ArnoldMetheney. Patient advised to start taking Lisinopril 20 mg. You can take 2 tablets of the 10 mg daily. Lisinopril 20 mg sent to pharmacy. Follow up in 2 weeks for a blood pressure check on the nurse schedule.

## 2016-03-26 NOTE — Progress Notes (Signed)
Hypertension-Will increase her lisinopril to 20 mg and have her follow back up in 2 weeks for blood pressure check. He for BMP today. Nani Gasseratherine Metheney, MD

## 2016-03-27 LAB — BASIC METABOLIC PANEL
BUN: 15 mg/dL (ref 7–25)
CALCIUM: 9.4 mg/dL (ref 8.6–10.4)
CO2: 28 mmol/L (ref 20–31)
Chloride: 104 mmol/L (ref 98–110)
Creat: 0.65 mg/dL (ref 0.60–0.93)
Glucose, Bld: 135 mg/dL — ABNORMAL HIGH (ref 65–99)
Potassium: 4 mmol/L (ref 3.5–5.3)
SODIUM: 140 mmol/L (ref 135–146)

## 2016-03-29 NOTE — Progress Notes (Signed)
All labs are normal. 

## 2016-04-09 ENCOUNTER — Ambulatory Visit (INDEPENDENT_AMBULATORY_CARE_PROVIDER_SITE_OTHER): Payer: Medicare Other | Admitting: Family Medicine

## 2016-04-09 DIAGNOSIS — I10 Essential (primary) hypertension: Secondary | ICD-10-CM

## 2016-04-09 MED ORDER — LISINOPRIL 20 MG PO TABS: 20.0000 mg | ORAL_TABLET | Freq: Every day | ORAL | 0 refills | Status: DC

## 2016-04-09 NOTE — Progress Notes (Signed)
HTN - well controlled. F/U with med in 3 months.   Nani Gasseratherine Metheney, MD

## 2016-04-09 NOTE — Progress Notes (Signed)
Pt in for 2 week bp check after increasing Lisinopril to 20mg .  Her bp is 137/62 today & pt reports that she feels so much better and is not having headaches anymore. I sent her a refill of Lisinopril #90 to [facility].

## 2016-04-16 ENCOUNTER — Other Ambulatory Visit: Payer: Self-pay | Admitting: Family Medicine

## 2016-04-16 DIAGNOSIS — I10 Essential (primary) hypertension: Secondary | ICD-10-CM

## 2016-04-19 ENCOUNTER — Ambulatory Visit (INDEPENDENT_AMBULATORY_CARE_PROVIDER_SITE_OTHER): Payer: Medicare Other | Admitting: Family Medicine

## 2016-04-19 VITALS — BP 152/70 | HR 75 | Wt 140.0 lb

## 2016-04-19 DIAGNOSIS — M5441 Lumbago with sciatica, right side: Secondary | ICD-10-CM

## 2016-04-19 DIAGNOSIS — G8929 Other chronic pain: Secondary | ICD-10-CM | POA: Diagnosis not present

## 2016-04-19 MED ORDER — GABAPENTIN 100 MG PO CAPS: ORAL_CAPSULE | ORAL | 1 refills | Status: DC

## 2016-04-19 NOTE — Patient Instructions (Signed)
Thank you for coming in today. Attend PT.  Use gabapentin at bedtime as needed for nerve pain.  Recheck in about 4 weeks.  Let me know how you are doing.    Come back or go to the emergency room if you notice new weakness new numbness problems walking or bowel or bladder problems.   Sciatica Sciatica is pain, numbness, weakness, or tingling along the path of the sciatic nerve. The sciatic nerve starts in the lower back and runs down the back of each leg. The nerve controls the muscles in the lower leg and in the back of the knee. It also provides feeling (sensation) to the back of the thigh, the lower leg, and the sole of the foot. Sciatica is a symptom of another medical condition that pinches or puts pressure on the sciatic nerve. Generally, sciatica only affects one side of the body. Sciatica usually goes away on its own or with treatment. In some cases, sciatica may keep coming back (recur). What are the causes? This condition is caused by pressure on the sciatic nerve, or pinching of the sciatic nerve. This may be the result of:  A disk in between the bones of the spine (vertebrae) bulging out too far (herniated disk).  Age-related changes in the spinal disks (degenerative disk disease).  A pain disorder that affects a muscle in the buttock (piriformis syndrome).  Extra bone growth (bone spur) near the sciatic nerve.  An injury or break (fracture) of the pelvis.  Pregnancy.  Tumor (rare). What increases the risk? The following factors may make you more likely to develop this condition:  Playing sports that place pressure or stress on the spine, such as football or weight lifting.  Having poor strength and flexibility.  A history of back injury.  A history of back surgery.  Sitting for long periods of time.  Doing activities that involve repetitive bending or lifting.  Obesity. What are the signs or symptoms? Symptoms can vary from mild to very severe, and they may  include:  Any of these problems in the lower back, leg, hip, or buttock:  Mild tingling or dull aches.  Burning sensations.  Sharp pains.  Numbness in the back of the calf or the sole of the foot.  Leg weakness.  Severe back pain that makes movement difficult. These symptoms may get worse when you cough, sneeze, or laugh, or when you sit or stand for long periods of time. Being overweight may also make symptoms worse. In some cases, symptoms may recur over time. How is this diagnosed? This condition may be diagnosed based on:  Your symptoms.  A physical exam. Your health care provider may ask you to do certain movements to check whether those movements trigger your symptoms.  You may have tests, including:  Blood tests.  X-rays.  MRI.  CT scan. How is this treated? In many cases, this condition improves on its own, without any treatment. However, treatment may include:  Reducing or modifying physical activity during periods of pain.  Exercising and stretching to strengthen your abdomen and improve the flexibility of your spine.  Icing and applying heat to the affected area.  Medicines that help:  To relieve pain and swelling.  To relax your muscles.  Injections of medicines that help to relieve pain, irritation, and inflammation around the sciatic nerve (steroids).  Surgery. Follow these instructions at home: Medicines  Take over-the-counter and prescription medicines only as told by your health care provider.  Do not  drive or operate heavy machinery while taking prescription pain medicine. Managing pain  If directed, apply ice to the affected area.  Put ice in a plastic bag.  Place a towel between your skin and the bag.  Leave the ice on for 20 minutes, 2-3 times a day.  After icing, apply heat to the affected area before you exercise or as often as told by your health care provider. Use the heat source that your health care provider recommends, such  as a moist heat pack or a heating pad.  Place a towel between your skin and the heat source.  Leave the heat on for 20-30 minutes.  Remove the heat if your skin turns bright red. This is especially important if you are unable to feel pain, heat, or cold. You may have a greater risk of getting burned. Activity  Return to your normal activities as told by your health care provider. Ask your health care provider what activities are safe for you.  Avoid activities that make your symptoms worse.  Take brief periods of rest throughout the day. Resting in a lying or standing position is usually better than sitting to rest.  When you rest for longer periods, mix in some mild activity or stretching between periods of rest. This will help to prevent stiffness and pain.  Avoid sitting for long periods of time without moving. Get up and move around at least one time each hour.  Exercise and stretch regularly, as told by your health care provider.  Do not lift anything that is heavier than 10 lb (4.5 kg) while you have symptoms of sciatica. When you do not have symptoms, you should still avoid heavy lifting, especially repetitive heavy lifting.  When you lift objects, always use proper lifting technique, which includes:  Bending your knees.  Keeping the load close to your body.  Avoiding twisting. General instructions  Use good posture.  Avoid leaning forward while sitting.  Avoid hunching over while standing.  Maintain a healthy weight. Excess weight puts extra stress on your back and makes it difficult to maintain good posture.  Wear supportive, comfortable shoes. Avoid wearing high heels.  Avoid sleeping on a mattress that is too soft or too hard. A mattress that is firm enough to support your back when you sleep may help to reduce your pain.  Keep all follow-up visits as told by your health care provider. This is important. Contact a health care provider if:  You have pain that  wakes you up when you are sleeping.  You have pain that gets worse when you lie down.  Your pain is worse than you have experienced in the past.  Your pain lasts longer than 4 weeks.  You experience unexplained weight loss. Get help right away if:  You lose control of your bowel or bladder (incontinence).  You have:  Weakness in your lower back, pelvis, buttocks, or legs that gets worse.  Redness or swelling of your back.  A burning sensation when you urinate. This information is not intended to replace advice given to you by your health care provider. Make sure you discuss any questions you have with your health care provider. Document Released: 02/09/2001 Document Revised: 07/22/2015 Document Reviewed: 10/25/2014 Elsevier Interactive Patient Education  2017 [employer]

## 2016-04-19 NOTE — Progress Notes (Signed)
Christy Gutierrez is a 76 y.o. female who presents to [facility] today for lumbar radiculopathy.   Christy Gutierrez has a 25 year history of low back pain radiating to the right leg. When she was first diagnosed in 1992 she had lots of physical therapy and trigger point injections and subsequently had resolution of her radicular pain. She's had low back pain intermittently off and on but overall has been doing very well. She is currently disabled but does some light yard work and light housework without a lot of pain. Over the last year so she's had worsening intermittent pain radiating to her right leg. She notes the pain radiates to the right lateral calf area between the first and second toes. She notes numbness and tingling sometimes as well but denies any weakness or bowel bladder dysfunction or difficulty walking. She has occasionally the pain wakes her from sleep but overall feels pretty well. She will occasionally take an over-the-counter medication for pain which helps a little. She would like to avoid extensive interventions or surgeries if possible. Similar problem about one year ago and did well with home exercises.  She denies any recent injuries.   Past Medical History:  Diagnosis Date  . Back pain    Past Surgical History:  Procedure Laterality Date  . TONSILLECTOMY    . TUBAL LIGATION     bilateral   Social History  Substance Use Topics  . Smoking status: Never Smoker  . Smokeless tobacco: Never Used  . Alcohol use No     ROS:  As above   Medications: Current Outpatient Prescriptions  Medication Sig Dispense Refill  . Acetaminophen-Caffeine (EXCEDRIN TENSION HEADACHE) 500-65 MG TABS Take by mouth.    . Calcium Carb-Cholecalciferol [phone] MG-UNIT TABS Take by mouth.    . co-enzyme Q-10 30 MG capsule Take 1 capsule (30 mg total) by mouth daily. 30 capsule 12  . diclofenac sodium (VOLTAREN) 1 % GEL Apply 4 g topically 4 (four) times  daily. To affected joint. 100 g 11  . fexofenadine (ALLEGRA) 180 MG tablet Take 1 tablet (180 mg total) by mouth daily. 90 tablet 3  . fluticasone (FLONASE) 50 MCG/ACT nasal spray One spray in each nostril twice a day, use left hand for right nostril, and right hand for left nostril. 48 g 3  . lisinopril (PRINIVIL,ZESTRIL) 20 MG tablet Take 1 tablet (20 mg total) by mouth daily. 90 tablet 0  . Multiple Vitamins-Minerals (MULTIVITAMIN WITH MINERALS) tablet Take 1 tablet by mouth daily.    Marland Kitchen omega-3 acid ethyl esters (LOVAZA) 1 g capsule Take 2 capsules (2 g total) by mouth 2 (two) times daily. 360 capsule 3  . SUMAtriptan (IMITREX) 100 MG tablet TAKE AS DIRECTED AS NEEDED 27 tablet 1  . gabapentin (NEURONTIN) 100 MG capsule Take 1-3 pills before bedtime for nerve pain. 90 capsule 1   No current facility-administered medications for this visit.    Allergies  Allergen Reactions  . Clarithromycin      Exam:  BP (!) 152/70   Pulse 75   Wt 140 lb (63.5 kg)   BMI 22.60 kg/m  General: Well Developed, well nourished, and in no acute distress.  Neuro/Psych: Alert and oriented x3, extra-ocular muscles intact, able to move all 4 extremities, sensation grossly intact. Skin: Warm and dry, no rashes noted.  Respiratory: Not using accessory muscles, speaking in full sentences, trachea midline.  Cardiovascular: Pulses palpable, no extremity edema. Abdomen: Does not appear  distended. MSK: L-spine: Slight lateral curvature present on sitting and standing. Otherwise normal-appearing Nontender to spinal midline. Normal lumbar motion. Lower extremity sensation strength reflexes are intact bilaterally. Normal gait.   L-spine x-ray dated 04/02/2015: CLINICAL DATA:  Low back pain radiating to the legs, no acute injury  EXAM: LUMBAR SPINE - COMPLETE 4+ VIEW  COMPARISON:  None.  FINDINGS: There is a lumbar spine curvature convex to the left by approximately 16 degrees. The bones are diffusely  osteopenic. There is a partial compression deformity of L1 vertebral body by approximately 20 present which appears old with spurring present. Degenerative spurring is noted at L2-3 and L4-5 levels with some loss of disc space at L4-5. There are degenerative changes throughout the facet joints diffusely. The SI joints are corticated.  IMPRESSION: 1. Mild lumbar scoliosis convex to the left by 16 degrees. 2. 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 3. Old partial compression deformity of L1 by 20%   Electronically Signed   By: Dwyane DeePaul  Barry M.D.   On: 04/02/2015 11:58   No results found for this or any previous visit (from the past 48 hour(s)). No results found.    Assessment and Plan: 76 y.o. female with lumbar radiculopathy likely L5 nerve root. Patient has extensive degenerative changes especially at L4-L5 and L5-S1. She also has a mild left scoliosis curvature convex to the left on x-ray about a year ago.  She is doing well with no functional limitations or weakness currently. She would like to avoid extensive or invasive tests or treatment if possible. Plan for a trial of physical therapy. If not better I would recommend MRIs and neck step area and as her symptoms are most bothersome at night at think using low-dose gabapentin at bedtime is reasonable. Will start at 100 mg at night and increase to 300 mg at night as needed. Recheck in about a month.  Of note patient declined influenza and pneumonia vaccines today.    Orders Placed This Encounter  Procedures  . Ambulatory referral to Physical Therapy    Referral Priority:   Routine    Referral Type:   Physical Medicine    Referral Reason:   Specialty Services Required    Requested Specialty:   Physical Therapy    Number of Visits Requested:   1    Discussed warning signs or symptoms. Please see discharge instructions. Patient expresses understanding.

## 2016-04-21 ENCOUNTER — Ambulatory Visit (INDEPENDENT_AMBULATORY_CARE_PROVIDER_SITE_OTHER): Payer: Medicare Other | Admitting: Physical Therapy

## 2016-04-21 DIAGNOSIS — G8929 Other chronic pain: Secondary | ICD-10-CM

## 2016-04-21 DIAGNOSIS — M5441 Lumbago with sciatica, right side: Secondary | ICD-10-CM | POA: Diagnosis present

## 2016-04-21 DIAGNOSIS — M6281 Muscle weakness (generalized): Secondary | ICD-10-CM

## 2016-04-21 DIAGNOSIS — R293 Abnormal posture: Secondary | ICD-10-CM | POA: Diagnosis not present

## 2016-04-21 NOTE — Therapy (Signed)
[facility] [place] [address] [place], [place], [address] Phone: [phone]   Fax:  [phone]  Physical Therapy Evaluation  Patient Details  Name: Christy Gutierrez MRN: [id number] Date of Birth: 09-05-1940 Referring Provider: Dr Clementeen Graham  Encounter Date: 04/21/2016      PT End of Session - 04/21/16 1235    Visit Number 1   Number of Visits 12   Date for PT Re-Evaluation 06/02/16   PT Start Time 0930   PT Stop Time 1003   PT Time Calculation (min) 33 min   Activity Tolerance Patient tolerated treatment well   Behavior During Therapy [facility] for tasks assessed/performed      Past Medical History:  Diagnosis Date  . Back pain     Past Surgical History:  Procedure Laterality Date  . TONSILLECTOMY    . TUBAL LIGATION     bilateral    There were no vitals filed for this visit.       Subjective Assessment - 04/21/16 0932    Subjective Pt is a 76 y/o female who presents to OPPT for LBP.  Pt reports 26 yrs ago had back pain and eventually went on disability (no surgical intervention).  Pt reports symptoms returned approx 1 year ago.  She states she's been consistent with exercises but they have been exacerbating symptoms so she stopped.  Reports she has to change positions at night almost every 2 hours due to pain, which neurontin has helped this as well.   Pertinent History osteoporosis, HTN, arthritis   Limitations Other (comment);House hold activities  worse through the night   How long can you stand comfortably? 10 min   How long can you walk comfortably? 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 Yes   Pain Score 0-No pain  up to 5/10   Pain Location Back   Pain Orientation Right   Pain Descriptors / Indicators Constant   Pain Radiating Towards RLE to foot (knee to foot is worse)   Pain Onset More than a month ago   Pain Frequency Intermittent   Aggravating Factors   standing, lying down in one position, walking   Pain Relieving Factors praying, neurontin            [facility] PT Assessment - 04/21/16 0940      Assessment   Medical Diagnosis LBP   Referring Provider Dr Clementeen Graham   Onset Date/Surgical Date --  26 yrs ago, with 1 yr exacerbation   Next MD Visit 05/17/16   Prior Therapy in past, with injections     Precautions   Precautions None     Restrictions   Weight Bearing Restrictions No     Balance Screen   Has the patient fallen in the past 6 months No   Has the patient had a decrease in activity level because of a fear of falling?  No   Is the patient reluctant to leave their home because of a fear of falling?  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, tends to [place] and rest  of land     Prior Function   Level of Independence Independent   Vocation On disability;Retired   Leisure spend time with family, sing, watch granddaughter play softball     Cognition   Overall Cognitive Status Within Functional Limits for tasks assessed     Observation/Other Assessments   Focus on Therapeutic Outcomes (FOTO)  61 (39% limited; predicted 36% limited)     Posture/Postural Control   Posture/Postural Control Postural limitations   Postural Limitations Rounded Shoulders;Forward head;Right pelvic obliquity     AROM   AROM Assessment Site Lumbar   Lumbar Flexion WNL with pain   Lumbar Extension WNL with improved symptoms   Lumbar - Right Side Bend limited 75%   Lumbar - Left Side Bend WNL     Strength   Strength Assessment Site Hip;Knee;Ankle   Right/Left Hip Right;Left   Right Hip Flexion 3/5   Right Hip Extension 3/5   Right Hip ABduction  3/5   Left Hip Flexion 3/5   Left Hip Extension 3+/5   Left Hip ABduction 4/5   Right/Left Knee Right;Left   Right Knee Flexion 3/5   Right Knee Extension 5/5   Left Knee Flexion 3+/5   Left Knee Extension 5/5   Right/Left Ankle Right;Left   Right Ankle Dorsiflexion 3+/5   Left Ankle Dorsiflexion 5/5     Flexibility   Soft Tissue Assessment /Muscle Length yes   Piriformis tightness R     Palpation   Palpation comment tenderness at R SIJ     Special Tests    Special Tests Leg LengthTest   Leg length test  True     True   Length cm   Right 88.5 in.   Left  87 in.     Ambulation/Gait   Gait Pattern Narrow base of support;Lateral hip instability;Trendelenburg;Lateral trunk lean to left                   [facility] Adult PT Treatment/Exercise - 04/21/16 0940      Exercises   Exercises Lumbar     Lumbar Exercises: Stretches   Piriformis Stretch 2 reps;30 seconds     Lumbar Exercises: Supine   Ab Set 10 reps;5 seconds                PT Education - 04/21/16 1235    Education provided Yes   Education Details HEP   Person(s) Educated Patient   Methods Explanation;Demonstration;Handout   Comprehension Verbalized understanding;Returned demonstration;Need further instruction             PT Long Term Goals - 04/21/16 1240      PT LONG TERM GOAL #1   Title independent with HEP (06/02/16)   Time 6   Period Weeks   Status New     PT LONG TERM GOAL #2   Title report ability to perform yardwork/farming activities > 1 hour without increase in pain for improved function (06/02/16)   Time 6   Period Weeks   Status New     PT LONG TERM GOAL #3   Title improve bil hip strength to >/= 4/5 for improved strength and functional mobility (06/02/16)   Time 6   Period Weeks   Status New     PT LONG TERM GOAL #4   Title improve FOTO score to </= 35% impaired for improved function (06/02/16)   Time 6   Period Weeks   Status New     PT LONG TERM  GOAL #5    Title reports centralization of symptoms and pain reported no > 2/10 (06/02/16)   Time 6   Period Weeks   Status New               Plan - 04/21/16 1236    Clinical Impression Statement Pt is a 76 y/o female who presents to OPPT for moderate complexity PT eval for LBP radiating into LLE.  Pt demonstrates postural abnormalities, leg length discrepency, decreased strength and ROM affecting ADLs and house responsibilities.  Will benefit from PT to address deficits listed.   Rehab Potential Good   Clinical Impairments Affecting Rehab Potential chronicity of injury, osteoporosis, arthritis   PT Frequency 2x / week   PT Duration 6 weeks   PT Treatment/Interventions ADLs/Self Care Home Management;Cryotherapy;Electrical Stimulation;Moist Heat;Traction;Ultrasound;Neuromuscular re-education;Therapeutic exercise;Therapeutic activities;Functional mobility training;Stair training;Gait training;Patient/family education;Orthotic Fit/Training;Manual techniques;Dry needling;Taping   PT Next Visit Plan try heel lift in L shoe, review HEP, progress (likely extension based), traction   Consulted and Agree with Plan of Care Patient      Patient will benefit from skilled therapeutic intervention in order to improve the following deficits and impairments:  Abnormal gait, Decreased mobility, Difficulty walking, Decreased strength, Decreased range of motion, Postural dysfunction  Visit Diagnosis: Chronic right-sided low back pain with right-sided sciatica - Plan: PT plan of care cert/re-cert  Muscle weakness (generalized) - Plan: PT plan of care cert/re-cert  Abnormal posture - Plan: PT plan of care cert/re-cert     Problem List Patient Active Problem List   Diagnosis Date Noted  . Essential hypertension 03/05/2016  . 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  . Left knee pain 04/02/2015  . Migraine 05/08/2013  . ELEVATED BLOOD PRESSURE WITHOUT DIAGNOSIS OF HYPERTENSION 03/10/2010  . Osteoporosis 11/10/2009   . UNSPECIFIED VITAMIN D DEFICIENCY 11/04/2009  . Hyperlipidemia 11/04/2009  . BACK PAIN, CHRONIC 11/04/2009  . FATIGUE 11/04/2009  . POSTMENOPAUSAL STATUS 11/04/2009      Clarita CraneStephanie F Shuntae Herzig, PT, DPT 04/21/16 12:45 PM    [facility] [address] [place], [place], [address] Phone: [phone]   Fax:  [phone]  Name: Christy Gutierrez MRN: [id number] Date of Birth: 1940-09-16

## 2016-04-21 NOTE — Patient Instructions (Signed)
Piriformis Stretch    Lying on back, pull right knee toward opposite shoulder. Hold __30__ seconds. Repeat __2__ times. Do __2__ sessions per day.  http://gt2.exer.us/258   Copyright  VHI. All rights reserved.   PELVIC STABILIZATION: Pelvic Tilt (Lying)    Exhaling, pull belly toward spine, tilting pelvis forward. Hold for 5 seconds. Repeat _10__ times. Do _2-3__ times per day.  Copyright  VHI. All rights reserved.

## 2016-04-26 ENCOUNTER — Encounter: Payer: Medicare Other | Admitting: Physical Therapy

## 2016-04-28 ENCOUNTER — Encounter: Payer: Medicare Other | Admitting: Physical Therapy

## 2016-05-03 ENCOUNTER — Encounter: Payer: Medicare Other | Admitting: Physical Therapy

## 2016-05-05 ENCOUNTER — Ambulatory Visit (INDEPENDENT_AMBULATORY_CARE_PROVIDER_SITE_OTHER): Payer: Medicare Other | Admitting: Physical Therapy

## 2016-05-05 DIAGNOSIS — M6281 Muscle weakness (generalized): Secondary | ICD-10-CM

## 2016-05-05 DIAGNOSIS — G8929 Other chronic pain: Secondary | ICD-10-CM

## 2016-05-05 DIAGNOSIS — R293 Abnormal posture: Secondary | ICD-10-CM

## 2016-05-05 DIAGNOSIS — M5441 Lumbago with sciatica, right side: Secondary | ICD-10-CM | POA: Diagnosis present

## 2016-05-05 NOTE — Patient Instructions (Signed)
3 part core exercise    With neutral spine, tighten pelvic floor, then tighten abdominal muscles sucking your belly button to back bone.  Hold 10 seconds. Repeat _10_ times. Do _several__ times a day.  On Elbows (Prone)    Rise up on elbows as high as possible, keeping hips on floor. Hold _15-30___ seconds. Repeat __5__ times per set. Do _1___ sets per session. Do __2__ sessions per day.  Sit to Supine (Active)    From sitting position, lie on side, top leg bent. Roll slowly to back.    [facility] [address] [place], [place] [address]  [phone] (office) [phone] (fax)

## 2016-05-05 NOTE — Therapy (Signed)
[place], [place], [address] Phone: [phone]   Fax:  [phone]  Physical Therapy Treatment  Patient Details  Name: Christy Gutierrez MRN: [id number] Date of Birth: Oct 24, 1940 Referring Provider: Dr. Lynne Leader  Encounter Date: 05/05/2016      PT End of Session - 05/05/16 1428    Visit Number 2   Number of Visits 12   Date for PT Re-Evaluation 06/02/16   PT Start Time 4132   PT Stop Time 1530   PT Time Calculation (min) 61 min   Activity Tolerance Patient tolerated treatment well   Behavior During Therapy [facility] for tasks assessed/performed      Past Medical History:  Diagnosis Date  . Back pain     Past Surgical History:  Procedure Laterality Date  . TONSILLECTOMY    . TUBAL LIGATION     bilateral    There were no vitals filed for this visit.      Subjective Assessment - 05/05/16 1432    Subjective Pt reports she has been ill with the flu. Today is the 2nd day out of the house.  She thinks laying around has helped her back. She did not perform exercises.    Pertinent History osteoporosis, HTN, arthritis   How long can you stand comfortably? 10 min   How long can you walk comfortably? 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 Yes   Pain Score 5    Pain Location Leg   Pain Orientation Right;Lower   Pain Radiating Towards RLE to the foot (knee to foot is worse)    Pain Onset More than a month ago   Pain Frequency Intermittent   Aggravating Factors  ?   Pain Relieving Factors ?   Multiple Pain Sites No            [facility] PT Assessment - 05/05/16 0001      Assessment   Medical Diagnosis LBP   Referring Provider Dr. Lynne Leader   Next MD Visit 05/17/16          [facility] Adult PT Treatment/Exercise - 05/05/16 0001      Self-Care   Self-Care Other Self-Care Comments   Other Self-Care Comments  Pt issued 1/2" heel lift for Lt shoe;  pt educated on use.     Lumbar Exercises: Stretches   Passive Hamstring Stretch --   Prone on Elbows Stretch 20 seconds;3 reps   Piriformis Stretch 30 seconds;4 reps   Piriformis Stretch Limitations some Lt neck pain with pulling knee up. Pt shown modifications (supine, sitting)     Lumbar Exercises: Supine   Ab Set 10 reps;5 seconds   Clam 10 reps;2 seconds  with stabilizer biofeedback   Heel Slides 5 reps  VC for form, with ab set   Bent Knee Raise 10 reps;1 second  with stabilizer biofeedback, for visual cues. 2 sets   Bent Knee Raise Limitations some increased pain with Rt hip fleixon    Other Supine Lumbar Exercises supine to/from sit via log roll to Lt x 5 reps (to Rt was painful; encouraged to avoid for now)      Lumbar Exercises: Prone   Straight Leg Raise 10 reps   Opposite Arm/Leg Raise Right arm/Left leg;Left arm/Right leg     Modalities   Modalities Moist Heat;Cryotherapy     Moist Heat Therapy   Number Minutes Moist Heat 15 Minutes  Moist Heat Location Cervical     Cryotherapy   Number Minutes Cryotherapy 15 Minutes   Cryotherapy Location Lumbar Spine   Type of Cryotherapy Ice pack                     PT Long Term Goals - 05/05/16 1525      PT LONG TERM GOAL #1   Title independent with HEP (06/02/16)   Time 6   Period Weeks   Status On-going     PT LONG TERM GOAL #2   Title report ability to perform yardwork/farming activities > 1 hour without increase in pain for improved function (06/02/16)   Time 6   Period Weeks   Status On-going     PT LONG TERM GOAL #3   Title improve bil hip strength to >/= 4/5 for improved strength and functional mobility (06/02/16)   Time 6   Period Weeks   Status On-going     PT LONG TERM GOAL #4   Title improve FOTO score to </= 35% impaired for improved function (06/02/16)   Time 6   Period Weeks   Status On-going     PT LONG TERM GOAL #5   Title reports centralization of symptoms and pain reported no >  2/10 (06/02/16)   Time 6   Period Weeks   Status On-going               Plan - 05/05/16 1551    Clinical Impression Statement Pt required multiple cues for proper core engagement.  She tolerated all exercises well, with only min increase in pain.  She reported reduction of radicular symptoms in her RLE with use of ice at end of treatment and MHP had eased tension in her neck. No goals met yet; only 2nd visit.    Rehab Potential Good   PT Frequency 2x / week   PT Duration 6 weeks   PT Treatment/Interventions ADLs/Self Care Home Management;Cryotherapy;Electrical Stimulation;Moist Heat;Traction;Ultrasound;Neuromuscular re-education;Therapeutic exercise;Therapeutic activities;Functional mobility training;Stair training;Gait training;Patient/family education;Orthotic Fit/Training;Manual techniques;Dry needling;Taping   PT Next Visit Plan Review changes to HEP; assess response to heel lift; trial of traction.    Consulted and Agree with Plan of Care Patient      Patient will benefit from skilled therapeutic intervention in order to improve the following deficits and impairments:  Abnormal gait, Decreased mobility, Difficulty walking, Decreased strength, Decreased range of motion, Postural dysfunction  Visit Diagnosis: Chronic right-sided low back pain with right-sided sciatica  Muscle weakness (generalized)  Abnormal posture     Problem List Patient Active Problem List   Diagnosis Date Noted  . Essential hypertension 03/05/2016  . 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  . Left knee pain 04/02/2015  . Migraine 05/08/2013  . ELEVATED BLOOD PRESSURE WITHOUT DIAGNOSIS OF HYPERTENSION 03/10/2010  . Osteoporosis 11/10/2009  . UNSPECIFIED VITAMIN D DEFICIENCY 11/04/2009  . Hyperlipidemia 11/04/2009  . BACK PAIN, CHRONIC 11/04/2009  . FATIGUE 11/04/2009  . POSTMENOPAUSAL STATUS 11/04/2009   Kerin Perna, PTA 05/05/16 5:14 PM  [place], [place], [address] Phone: [phone]   Fax:  [phone]  Name: Christy Gutierrez MRN: [id number] Date of Birth: 14-Mar-1940

## 2016-05-10 ENCOUNTER — Encounter: Payer: Medicare Other | Admitting: Physical Therapy

## 2016-05-12 ENCOUNTER — Encounter: Payer: Self-pay | Admitting: Rehabilitative and Restorative Service Providers"

## 2016-05-12 ENCOUNTER — Ambulatory Visit (INDEPENDENT_AMBULATORY_CARE_PROVIDER_SITE_OTHER): Payer: Medicare Other | Admitting: Rehabilitative and Restorative Service Providers"

## 2016-05-12 DIAGNOSIS — M6281 Muscle weakness (generalized): Secondary | ICD-10-CM | POA: Diagnosis not present

## 2016-05-12 DIAGNOSIS — M5441 Lumbago with sciatica, right side: Secondary | ICD-10-CM

## 2016-05-12 DIAGNOSIS — G8929 Other chronic pain: Secondary | ICD-10-CM

## 2016-05-12 DIAGNOSIS — R293 Abnormal posture: Secondary | ICD-10-CM

## 2016-05-12 NOTE — Therapy (Addendum)
[place], [place], [address] Phone: [phone]   Fax:  [phone]  Physical Therapy Treatment  Patient Details  Name: Christy Gutierrez MRN: [id number] Date of Birth: [birth year] Referring Provider: Dr Lynne Leader   Encounter Date: 05/12/2016      PT End of Session - 05/12/16 1429    Visit Number 3   Number of Visits 12   Date for PT Re-Evaluation 06/02/16   PT Start Time 6440   PT Stop Time 1527   PT Time Calculation (min) 58 min   Activity Tolerance Patient tolerated treatment well      Past Medical History:  Diagnosis Date  . Back pain     Past Surgical History:  Procedure Laterality Date  . TONSILLECTOMY    . TUBAL LIGATION     bilateral    There were no vitals filed for this visit.      Subjective Assessment - 05/12/16 1431    Subjective Patient reports that she has not had consistent time to do her exercses - recovering from the flu. She has had pain in the Lt anterior hip - Flare up of pain following sitting to watch her granddaughter play a double header in softball. Flare up of Rt anterior hip pain following HEP Monday. She is now worse than she was when she started therapy.  Getting in and out of the floor for exercises which is difficult. Feels better - less pain and greater ease of movement with today's treatment.    Currently in Pain? Yes   Pain Orientation Right;Lower   Pain Descriptors / Indicators Constant   Pain Onset More than a month ago   Aggravating Factors  standing walking    Pain Relieving Factors sitting is some better             [facility] PT Assessment - 05/12/16 0001      Assessment   Medical Diagnosis LBP; Rt > Lt anterior hip pain    Referring Provider Dr Lynne Leader    Onset Date/Surgical Date --  increase in symptoms in the past year    Next MD Visit 05/17/16     Posture/Postural Control   Postural Limitations Rounded Shoulders;Forward head;Right  pelvic obliquity     Strength   Strength Assessment Site --  no significant change in strength bilat LE's    Right Hip Flexion 3/5   Right Hip Extension 3/5   Right Hip ABduction 3/5   Left Hip Flexion 3/5   Left Hip Extension 3+/5   Left Hip ABduction 4/5   Right Knee Flexion 3/5   Right Knee Extension 5/5   Left Knee Flexion 3+/5   Left Knee Extension 5/5   Right Ankle Dorsiflexion 3+/5   Left Ankle Dorsiflexion 5/5     Flexibility   Soft Tissue Assessment /Muscle Length --  significant muscular tightness hip flexors Rt > Lt    ITB tightness Rt    Piriformis tightness R     Palpation   Palpation comment significant tightness Rt > Lt iliopsoas; sartorius; proximal quads; IT band                      [facility] Adult PT Treatment/Exercise - 05/12/16 0001      Lumbar Exercises: Stretches   Standing Extension 5 reps  3-4 sec - to pt tolerance      Lumbar Exercises: Standing   Other Standing Lumbar Exercises hip extension  in standing - 5 reps x 2 sets each LE w/ ball release between sets    Other Standing Lumbar Exercises walking - encouraging core engaged and upright posture      Lumbar Exercises: Supine   Ab Set --  encouraging core with all activities      Modalities   Modalities Moist Heat;Cryotherapy;Electrical Stimulation     Moist Heat Therapy   Number Minutes Moist Heat 20 Minutes   Moist Heat Location Hip  bilat ant hips      Cryotherapy   Number Minutes Cryotherapy 20 Minutes   Cryotherapy Location Lumbar Spine   Type of Cryotherapy Ice pack     Electrical Stimulation   Electrical Stimulation Location Rt posterior hip; anterior hip/hip flexors and lateral proximal thigh    Electrical Stimulation Action IFC   Electrical Stimulation Parameters to tolerance   Electrical Stimulation Goals Pain;Tone     Manual Therapy   Manual therapy comments pt supine - hips and knees supported on bolster    Soft tissue mobilization Rt > Lt hip flexors  including iliopsoas; sartorius; quad    Myofascial Release self release anterior hips/thigh bilat; posterior and lateral hip right using ~ 4 inch ball    Passive ROM assisted hip abduction 20-30 sec x 2; assisted hip extension to table top 20-30 sec x 1 slight knee flexion to stretch hip flexors                 PT Education - 05/12/16 1515    Education provided Yes   Education Details HEP TENS unit    Person(s) Educated Patient   Methods Explanation;Demonstration;Tactile cues;Verbal cues;Handout   Comprehension Verbalized understanding;Returned demonstration;Verbal cues required;Tactile cues required             PT Long Term Goals - 05/12/16 1435      PT LONG TERM GOAL #1   Title independent with HEP (06/02/16)   Time 6   Period Weeks   Status On-going     PT LONG TERM GOAL #2   Title report ability to perform yardwork/farming activities > 1 hour without increase in pain for improved function (06/02/16)   Time 6   Period Weeks   Status On-going     PT LONG TERM GOAL #3   Title improve bil hip strength to >/= 4/5 for improved strength and functional mobility (06/02/16)   Time 6   Period Weeks   Status On-going     PT LONG TERM GOAL #4   Title improve FOTO score to </= 35% impaired for improved function (06/02/16)   Time 6   Period Weeks   Status On-going     PT LONG TERM GOAL #5   Title reports centralization of symptoms and pain reported no > 2/10 (06/02/16)   Time 6   Period Weeks   Status On-going               Plan - 05/12/16 1526    Clinical Impression Statement significant shortening through hip flexors - Rt > Lt. Pt responded very well to manual work through anterior hips/trunk; myofacial release in standing using ~4 inch ball; active hip extension and gentle trunk extension in standing followed by ice/heat/e-stim. patient will focus on these activities at home and assess response. She will do exercises in standing as needed lying on the bed. Patient  was encouraged to lie down with hips and legs in extesion for a few minutes several times a day to get  out of hip flexion where she stays most of the day. Patient was given additional heel lift for "work shoes" so she is more consistently more equal in height. Patient will benefit from PT to address anterior hip tightness; posture and alignment; progressing to core stabilization.    Rehab Potential Good   Clinical Impairments Affecting Rehab Potential chronicity of injury, osteoporosis, arthritis   PT Frequency 2x / week   PT Duration 6 weeks   PT Treatment/Interventions ADLs/Self Care Home Management;Cryotherapy;Electrical Stimulation;Moist Heat;Traction;Ultrasound;Neuromuscular re-education;Therapeutic exercise;Therapeutic activities;Functional mobility training;Stair training;Gait training;Patient/family education;Orthotic Fit/Training;Manual techniques;Dry needling;Taping   PT Next Visit Plan Review changes to HEP; assess response to manual work through hip flexors; addition of modalities - consider DN (not discussed with pt)    Consulted and Agree with Plan of Care Patient         Late Entry G-Code:  Functional Assessment Tool Used  FOTO 38% limited   Functional Limitations Mobility: Walking and moving around        Mobility: Walking and Moving Around Goal Status [id number]) At least 20 percent but less than 40 percent impaired, limited or restricted   Mobility: Walking and Moving Around Discharge Status [id number])  At least 20 percent but less than 40 percent impaired, limited or restricted     Laureen Abrahams, PT, DPT 05/24/16 1:16 PM    Patient will benefit from skilled therapeutic intervention in order to improve the following deficits and impairments:  Abnormal gait, Decreased mobility, Difficulty walking, Decreased strength, Decreased range of motion, Postural dysfunction  Visit Diagnosis: Chronic right-sided low back pain with right-sided sciatica  Muscle weakness  (generalized)  Abnormal posture     Problem List Patient Active Problem List   Diagnosis Date Noted  . Essential hypertension 03/05/2016  . 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  . Left knee pain 04/02/2015  . Migraine 05/08/2013  . ELEVATED BLOOD PRESSURE WITHOUT DIAGNOSIS OF HYPERTENSION 03/10/2010  . Osteoporosis 11/10/2009  . UNSPECIFIED VITAMIN D DEFICIENCY 11/04/2009  . Hyperlipidemia 11/04/2009  . BACK PAIN, CHRONIC 11/04/2009  . FATIGUE 11/04/2009  . POSTMENOPAUSAL STATUS 11/04/2009    Kamile Fassler Nilda Simmer PT, MPH  05/12/2016, 3:47 PM  [facility] [place], [place], [address] Phone: [phone]   Fax:  [phone]  Name: Treniece Holsclaw MRN: [id number] Date of Birth: 1940/04/18        PHYSICAL THERAPY DISCHARGE SUMMARY  Visits from Start of Care: 3  Current functional level related to goals / functional outcomes: See above   Remaining deficits: unknown   Education / Equipment: HEP  Plan: Patient agrees to discharge.  Patient goals were not met. Patient is being discharged due to the patient's request.  ?????    Laureen Abrahams, PT, DPT 05/24/16 1:16 PM  [facility] [address] [place] [place], [place] [address]  [phone] (office) [phone] (fax)

## 2016-05-12 NOTE — Patient Instructions (Addendum)
Self massage using a 4 inch plastic ball Along the front, side and back of the hip and can also massage through the low back   Strengthening: Hip Extension    With tubing around right ankle, face anchor and pull leg straight back. Repeat __5-10__ times per set. Do __1-3__ sets per session. Do _3-4___ sessions per day.   Trunk Extension    Standing, place back of open hands on low back. Straighten spine then arch the back and move shoulders back. Repeat __2-3 __ times per session. Do __severaltimes/day  TENS UNIT: This is helpful for muscle pain and spasm.   Search and Purchase a TENS 7000 2nd edition at www.tenspros.com. It should be less than $30.     TENS unit instructions: Do not shower or bathe with the unit on Turn the unit off before removing electrodes or batteries If the electrodes lose stickiness add a drop of water to the electrodes after they are disconnected from the unit and place on plastic sheet. If you continued to have difficulty, call the TENS unit company to purchase more electrodes. Do not apply lotion on the skin area prior to use. Make sure the skin is clean and dry as this will help prolong the life of the electrodes. After use, always check skin for unusual red areas, rash or other skin difficulties. If there are any skin problems, does not apply electrodes to the same area. Never remove the electrodes from the unit by pulling the wires. Do not use the TENS unit or electrodes other than as directed. Do not change electrode placement without consultating your therapist or physician. Keep 2 fingers with between each electrode.

## 2016-05-17 ENCOUNTER — Encounter: Payer: Self-pay | Admitting: Family Medicine

## 2016-05-17 ENCOUNTER — Ambulatory Visit (INDEPENDENT_AMBULATORY_CARE_PROVIDER_SITE_OTHER): Payer: Medicare Other | Admitting: Family Medicine

## 2016-05-17 ENCOUNTER — Ambulatory Visit: Payer: Medicare Other

## 2016-05-17 VITALS — BP 131/68 | HR 74 | Wt 138.0 lb

## 2016-05-17 DIAGNOSIS — G8929 Other chronic pain: Secondary | ICD-10-CM | POA: Diagnosis not present

## 2016-05-17 DIAGNOSIS — R05 Cough: Secondary | ICD-10-CM

## 2016-05-17 DIAGNOSIS — R059 Cough, unspecified: Secondary | ICD-10-CM

## 2016-05-17 DIAGNOSIS — M5441 Lumbago with sciatica, right side: Secondary | ICD-10-CM

## 2016-05-17 MED ORDER — BENZONATATE 200 MG PO CAPS: 200.0000 mg | ORAL_CAPSULE | Freq: Three times a day (TID) | ORAL | 0 refills | Status: DC | PRN

## 2016-05-17 MED ORDER — IPRATROPIUM BROMIDE 0.06 % NA SOLN: 2.0000 | NASAL | 6 refills | Status: DC | PRN

## 2016-05-17 MED ORDER — GABAPENTIN 100 MG PO CAPS: ORAL_CAPSULE | ORAL | 3 refills | Status: DC

## 2016-05-17 NOTE — Patient Instructions (Signed)
Thank you for coming in today. Use flonase Add tessalon pearles and atrovent nasal spray  Return as needed.  Continue gabapentin and PT.

## 2016-05-17 NOTE — Progress Notes (Signed)
Christy Gutierrez is a 76 y.o. female who [facility] today for follow-up lumbago with right radicular pain. Patient was seen last month for low back pain radiating to the right leg. She had a trial of physical therapy which was interrupted by influenza. She notes she is not really been able to do much physical therapy activities. However she was also prescribed gabapentin which shehas been very helpful. She thinks overall she is significantly improved but has referred improvement. She denies any weakness or numbness bowel bladder dysfunction.  As part of her influenza in the last month she has developed a cough. She notes the cough is mildly obnoxious to moderately nauseous. It does occasionally interfere with sleep. She has postnasal drainage as well. She denies any fevers or chills or trouble breathing currently. She has tried several over-the-counter medications which help a little.   Past Medical History:  Diagnosis Date  . Back pain    Past Surgical History:  Procedure Laterality Date  . TONSILLECTOMY    . TUBAL LIGATION     bilateral   Social History  Substance Use Topics  . Smoking status: Never Smoker  . Smokeless tobacco: Never Used  . Alcohol use No     ROS:  As above   Medications: Current Outpatient Prescriptions  Medication Sig Dispense Refill  . Acetaminophen-Caffeine (EXCEDRIN TENSION HEADACHE) 500-65 MG TABS Take by mouth.    . Calcium Carb-Cholecalciferol [phone] MG-UNIT TABS Take by mouth.    . co-enzyme Q-10 30 MG capsule Take 1 capsule (30 mg total) by mouth daily. 30 capsule 12  . diclofenac sodium (VOLTAREN) 1 % GEL Apply 4 g topically 4 (four) times daily. To affected joint. 100 g 11  . fexofenadine (ALLEGRA) 180 MG tablet Take 1 tablet (180 mg total) by mouth daily. 90 tablet 3  . fluticasone (FLONASE) 50 MCG/ACT nasal spray One spray in each nostril twice a day, use left hand for right nostril, and  right hand for left nostril. 48 g 3  . gabapentin (NEURONTIN) 100 MG capsule Take 1-3 pills before bedtime for nerve pain. 90 capsule 3  . lisinopril (PRINIVIL,ZESTRIL) 20 MG tablet Take 1 tablet (20 mg total) by mouth daily. 90 tablet 0  . Multiple Vitamins-Minerals (MULTIVITAMIN WITH MINERALS) tablet Take 1 tablet by mouth daily.    Marland Kitchen. omega-3 acid ethyl esters (LOVAZA) 1 g capsule Take 2 capsules (2 g total) by mouth 2 (two) times daily. 360 capsule 3  . SUMAtriptan (IMITREX) 100 MG tablet TAKE AS DIRECTED AS NEEDED 27 tablet 1  . benzonatate (TESSALON) 200 MG capsule Take 1 capsule (200 mg total) by mouth 3 (three) times daily as needed for cough. 45 capsule 0  . ipratropium (ATROVENT) 0.06 % nasal spray Place 2 sprays into both nostrils every 4 (four) hours as needed for rhinitis. 10 mL 6   No current facility-administered medications for this visit.    Allergies  Allergen Reactions  . Clarithromycin      Exam:  BP 131/68   Pulse 74   Wt 138 lb (62.6 kg)   BMI 22.27 kg/m  General: Well Developed, well nourished, and in no acute distress.  Neuro/Psych: Alert and oriented x3, extra-ocular muscles intact, able to move all 4 extremities, sensation grossly intact. Skin: Warm and dry, no rashes noted.  Respiratory: Not using accessory muscles, speaking in full sentences, trachea midline. Clear to auscultation bilaterally Cardiovascular: Pulses palpable, no extremity edema. Abdomen: Does not  appear distended. MSK: L-spine nontender decreased motion antalgic gait present    No results found for this or any previous visit (from the past 48 hour(s)). No results found.    Assessment and Plan: 76 y.o. female with lumbago with right radiculopathy. Significantly improved. Continue gabapentin. Continue physical therapy as tolerated. Return as needed.  Cough: Likely postviral. Treatment with Tessalon Perles and Atrovent nasal spray.    No orders of the defined types were placed in  this encounter.   Discussed warning signs or symptoms. Please see discharge instructions. Patient expresses understanding.

## 2016-07-13 ENCOUNTER — Other Ambulatory Visit: Payer: Self-pay | Admitting: Family Medicine

## 2016-07-13 DIAGNOSIS — I10 Essential (primary) hypertension: Secondary | ICD-10-CM

## 2016-08-31 ENCOUNTER — Other Ambulatory Visit: Payer: Self-pay | Admitting: Family Medicine

## 2016-08-31 DIAGNOSIS — I10 Essential (primary) hypertension: Secondary | ICD-10-CM

## 2016-12-20 ENCOUNTER — Other Ambulatory Visit: Payer: Self-pay | Admitting: Family Medicine

## 2016-12-20 DIAGNOSIS — I10 Essential (primary) hypertension: Secondary | ICD-10-CM

## 2017-02-03 ENCOUNTER — Other Ambulatory Visit: Payer: Self-pay

## 2017-02-25 DIAGNOSIS — H25813 Combined forms of age-related cataract, bilateral: Secondary | ICD-10-CM | POA: Diagnosis not present

## 2017-02-25 DIAGNOSIS — H527 Unspecified disorder of refraction: Secondary | ICD-10-CM | POA: Diagnosis not present

## 2017-03-29 ENCOUNTER — Other Ambulatory Visit: Payer: Self-pay | Admitting: *Deleted

## 2017-03-29 ENCOUNTER — Other Ambulatory Visit: Payer: Self-pay | Admitting: Family Medicine

## 2017-03-29 DIAGNOSIS — I10 Essential (primary) hypertension: Secondary | ICD-10-CM

## 2017-04-19 ENCOUNTER — Ambulatory Visit (INDEPENDENT_AMBULATORY_CARE_PROVIDER_SITE_OTHER): Payer: Medicare Other | Admitting: Family Medicine

## 2017-04-19 ENCOUNTER — Encounter: Payer: Self-pay | Admitting: Family Medicine

## 2017-04-19 VITALS — BP 138/78 | HR 71 | Ht 66.0 in | Wt 143.0 lb

## 2017-04-19 DIAGNOSIS — S99922A Unspecified injury of left foot, initial encounter: Secondary | ICD-10-CM

## 2017-04-19 DIAGNOSIS — G43009 Migraine without aura, not intractable, without status migrainosus: Secondary | ICD-10-CM

## 2017-04-19 DIAGNOSIS — I10 Essential (primary) hypertension: Secondary | ICD-10-CM | POA: Diagnosis not present

## 2017-04-19 DIAGNOSIS — E781 Pure hyperglyceridemia: Secondary | ICD-10-CM

## 2017-04-19 MED ORDER — LISINOPRIL 20 MG PO TABS: 20.0000 mg | ORAL_TABLET | Freq: Every day | ORAL | 1 refills | Status: DC

## 2017-04-19 MED ORDER — LISINOPRIL 20 MG PO TABS: 20.0000 mg | ORAL_TABLET | Freq: Every day | ORAL | 0 refills | Status: DC

## 2017-04-19 MED ORDER — OMEGA-3-ACID ETHYL ESTERS 1 G PO CAPS
2.0000 | ORAL_CAPSULE | Freq: Two times a day (BID) | ORAL | 3 refills | Status: DC
Start: 2017-04-19 — End: 2018-03-29

## 2017-04-19 NOTE — Progress Notes (Signed)
Subjective:    CC: HTN  HPI:  Hypertension- Pt denies chest pain, SOB, dizziness, or heart palpitations.  Taking meds as directed w/o problems.  Denies medication side effects.   Currently on lisinopril 20 mg daily.  Says her BP was good at the dentist and eye doc.  She does report that she had some stomach pain and some diarrhea last night but is feeling a little bit better today.  Nurse if her blood pressure might be up.  She is been noticing a little bit more pressure behind her eyes.  And just feeling off a little bit with her balance which is how she felt when she was initially diagnosed.  Migraine - says her HA have been better since she has been on the lisinopril.    For triglyceridemia-currently on lovaza.  Her prescription for some reason says 1 tab twice a day instead of 2 tabs twice a day.  I think it got refilled with the wrong Sig on it.    Lab Results  Component Value Date   CHOL 207 (H) 11/21/2014   HDL 82 11/21/2014   LDLCALC 115 11/21/2014   TRIG 52 11/21/2014   CHOLHDL 2.5 11/21/2014   So I made a look at her left great toenail.  She thinks she may have injured it but the toenail looks discolored.  She been applying Neosporin ointment to it.  It was very tender initially.  She thinks she noticed it initially around October.  It is no longer painful or tender.  Past medical history, Surgical history, Family history not pertinant except as noted below, Social history, Allergies, and medications have been entered into the medical record, reviewed, and corrections made.   Review of Systems: No fevers, chills, night sweats, weight loss, chest pain, or shortness of breath.   Objective:    General: Well Developed, well nourished, and in no acute distress.  Neuro: Alert and oriented x3, extra-ocular muscles intact, sensation grossly intact.  HEENT: Normocephalic, atraumatic  Skin: Warm and dry, no rashes. Cardiac: Regular rate and rhythm, no murmurs rubs or gallops, no lower  extremity edema.  Respiratory: Clear to auscultation bilaterally. Not using accessory muscles, speaking in full sentences.   Impression and Recommendations:     HTN -he has been under a little more stresses recently which might be a increase in her blood pressure.  She is also been getting into some salt.  Encouraged her to work on lowering stress and decreasing salt intake.  Repeat blood pressure was better.  But not optimal.  She feels like she is continuing to have symptoms of elevated blood pressure and please come back in sooner rather than later and we can address it and possibly adjust her medication regimen.  Due for labs today.  Migraine HA -significantly improved since controlling her blood pressure.  Left great toenail injury-looks like some type of hematoma formed underneath the nail from an injury.  It does not look fungal.  It looks like she will likely end up losing that nail probably in about 3 or 4 months but there is some new growth at the base already.  Hypertriglyceridemia-we will correct the low dose on the low fossa.

## 2017-04-21 DIAGNOSIS — E781 Pure hyperglyceridemia: Secondary | ICD-10-CM | POA: Diagnosis not present

## 2017-04-21 DIAGNOSIS — G43009 Migraine without aura, not intractable, without status migrainosus: Secondary | ICD-10-CM | POA: Diagnosis not present

## 2017-04-21 DIAGNOSIS — I10 Essential (primary) hypertension: Secondary | ICD-10-CM | POA: Diagnosis not present

## 2017-04-21 LAB — COMPLETE METABOLIC PANEL WITH GFR
AG Ratio: 1.8 (calc) (ref 1.0–2.5)
ALBUMIN MSPROF: 4.3 g/dL (ref 3.6–5.1)
ALT: 17 U/L (ref 6–29)
AST: 22 U/L (ref 10–35)
Alkaline phosphatase (APISO): 62 U/L (ref 33–130)
BUN: 16 mg/dL (ref 7–25)
CALCIUM: 9.4 mg/dL (ref 8.6–10.4)
CO2: 29 mmol/L (ref 20–32)
Chloride: 105 mmol/L (ref 98–110)
Creat: 0.71 mg/dL (ref 0.60–0.93)
GFR, EST AFRICAN AMERICAN: 95 mL/min/{1.73_m2} (ref >=60)
GFR, Est Non African American: 82 mL/min/{1.73_m2} (ref >=60)
Globulin: 2.4 g/dL (calc) (ref 1.9–3.7)
Glucose, Bld: 96 mg/dL (ref 65–99)
Potassium: 4 mmol/L (ref 3.5–5.3)
Sodium: 141 mmol/L (ref 135–146)
TOTAL PROTEIN: 6.7 g/dL (ref 6.1–8.1)
Total Bilirubin: 0.6 mg/dL (ref 0.2–1.2)

## 2017-04-21 LAB — LIPID PANEL
Cholesterol: 219 mg/dL — ABNORMAL HIGH (ref <200)
HDL: 86 mg/dL (ref >50)
LDL CHOLESTEROL (CALC): 119 mg/dL — AB
Non-HDL Cholesterol (Calc): 133 mg/dL (calc) — ABNORMAL HIGH (ref <130)
Total CHOL/HDL Ratio: 2.5 (calc) (ref <5.0)
Triglycerides: 45 mg/dL (ref <150)

## 2017-04-21 LAB — TSH: TSH: 2.04 m[IU]/L (ref 0.40–4.50)

## 2017-08-08 ENCOUNTER — Ambulatory Visit (INDEPENDENT_AMBULATORY_CARE_PROVIDER_SITE_OTHER): Payer: Medicare Other | Admitting: Physician Assistant

## 2017-08-08 ENCOUNTER — Encounter: Payer: Self-pay | Admitting: Physician Assistant

## 2017-08-08 VITALS — BP 171/96 | HR 68 | Temp 97.9°F | Wt 146.0 lb

## 2017-08-08 DIAGNOSIS — R0982 Postnasal drip: Secondary | ICD-10-CM | POA: Diagnosis not present

## 2017-08-08 DIAGNOSIS — J309 Allergic rhinitis, unspecified: Secondary | ICD-10-CM

## 2017-08-08 DIAGNOSIS — I10 Essential (primary) hypertension: Secondary | ICD-10-CM

## 2017-08-08 DIAGNOSIS — R42 Dizziness and giddiness: Secondary | ICD-10-CM | POA: Insufficient documentation

## 2017-08-08 MED ORDER — FEXOFENADINE HCL 180 MG PO TABS: 180.0000 mg | ORAL_TABLET | Freq: Every day | ORAL | 3 refills | Status: AC

## 2017-08-08 MED ORDER — AMLODIPINE BESYLATE 5 MG PO TABS: 5.0000 mg | ORAL_TABLET | Freq: Every day | ORAL | 0 refills | Status: DC

## 2017-08-08 MED ORDER — FLUTICASONE PROPIONATE 50 MCG/ACT NA SUSP: 1.0000 | Freq: Every day | NASAL | 3 refills | Status: AC

## 2017-08-08 MED ORDER — NONFORMULARY OR COMPOUNDED ITEM: 0 refills | Status: DC

## 2017-08-08 NOTE — Progress Notes (Signed)
HPI:                                                                Christy Gutierrez is a 77 y.o. female who presents to [facility] today for sore throat and lightheadedness  1 week of frontal sinus pressure, congestion and sore throat. Describes burning and post-nasal drip. Symptoms are worse at night and gradually improve throughout the day. Denies fever, chills, purulent drainage.  Also endorses episodic lightheadedness x 1 week described as "things seem funny/off." Episodes last minutes. They have occurred 3 or 4 times. Two of the episodes occurred while driving on the highway. Reports feeling like she had an urge to get off of the highway because she felt out of control. Worse with position changes and doing household chores. States this has happened before when she presented with high blood pressure. Checked her BP at home and systolic 136. Denies syncope or falls or near misses. Denies vertigo, vision change, nuasea/vomiting, or gait disturbance.     Past Medical History:  Diagnosis Date  . Back pain    Past Surgical History:  Procedure Laterality Date  . TONSILLECTOMY    . TUBAL LIGATION     bilateral   Social History   Tobacco Use  . Smoking status: Never Smoker  . Smokeless tobacco: Never Used  Substance Use Topics  . Alcohol use: No   family history includes Alcohol abuse in her father; Cancer in her brother.    ROS: negative except as noted in the HPI  Medications: Current Outpatient Medications  Medication Sig Dispense Refill  . Acetaminophen-Caffeine (EXCEDRIN TENSION HEADACHE) 500-65 MG TABS Take by mouth.    . Calcium Carb-Cholecalciferol [phone] MG-UNIT TABS Take by mouth.    . diclofenac sodium (VOLTAREN) 1 % GEL Apply 4 g topically 4 (four) times daily. To affected joint. 100 g 11  . fexofenadine (ALLEGRA) 180 MG tablet Take 1 tablet (180 mg total) by mouth daily. 90 tablet 3  . gabapentin (NEURONTIN) 100 MG  capsule Take 1-3 pills before bedtime for nerve pain. 90 capsule 3  . ipratropium (ATROVENT) 0.06 % nasal spray Place 2 sprays into both nostrils every 4 (four) hours as needed for rhinitis. 10 mL 6  . lisinopril (PRINIVIL,ZESTRIL) 20 MG tablet Take 1 tablet (20 mg total) by mouth daily. 90 tablet 1  . Multiple Vitamins-Minerals (MULTIVITAMIN WITH MINERALS) tablet Take 1 tablet by mouth daily.    Marland Kitchen omega-3 acid ethyl esters (LOVAZA) 1 g capsule Take 2 capsules (2 g total) by mouth 2 (two) times daily. 360 capsule 3  . SUMAtriptan (IMITREX) 100 MG tablet TAKE AS DIRECTED AS NEEDED 27 tablet 1   No current facility-administered medications for this visit.    Allergies  Allergen Reactions  . Clarithromycin        Objective:  BP (!) 157/91   Pulse 68   Temp 97.9 F (36.6 C) (Oral)   Wt 146 lb (66.2 kg)   SpO2 99%   BMI 23.57 kg/m  Gen:  alert, not ill-appearing, no distress, appropriate for age HEENT: head normocephalic without obvious abnormality, conjunctiva and cornea clear, PERRL, EOM's intact, TM's pearly gray and semitransparent, nasal mucosa pink with clear rhinorrhea, oropharynx clear, moist  mucous membranes, no frontal/maxillary sinus tenderness, neck supple, no cervical adenopathy, trachea midline Pulm: Normal work of breathing, normal phonation, clear to auscultation bilaterally, no wheezes, rales or rhonchi CV: Normal rate, regular rhythm, s1 and s2 distinct, no murmurs, clicks or rubs  Neuro: alert and oriented x 3, no tremor MSK: extremities atraumatic, normal gait and station, no peripheral edema Skin: intact, no rashes on exposed skin, no jaundice, no cyanosis Psych: well-groomed, cooperative, good eye contact, euthymic mood, affect mood-congruent, speech is articulate, and thought processes clear and goal-directed  No data found.    No results found for this or any previous visit (from the past 72 hour(s)). No results found.    Assessment and Plan: 77 y.o.  female with   Allergic rhinitis with postnasal drip - Plan: fexofenadine (ALLEGRA) 180 MG tablet, fluticasone (FLONASE) 50 MCG/ACT nasal spray  Elevated blood pressure reading in office with diagnosis of hypertension - Plan: amLODipine (NORVASC) 5 MG tablet  Essential hypertension - Plan: amLODipine (NORVASC) 5 MG tablet, NONFORMULARY OR COMPOUNDED ITEM  Episodic lightheadedness  Allergic rhinitis - no evidence of sinusitis on exam - continue Allegra, adding intranasal steroid and nasal saline rinses  HTN BP Readings from Last 3 Encounters:  08/08/17 (!) 171/96  04/19/17 138/78  05/17/16 131/68  - BP out of range in office today. Patient is having dizziness. 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 Monitor closely for hypotension. Consider reducing or discontinuing Gabapentin  Patient education and anticipatory guidance given Patient agrees with treatment plan Follow-up in 2 weeks or sooner as needed if symptoms worsen or fail to improve  Levonne Hubertharley E. Cummings PA-C

## 2017-08-08 NOTE — Patient Instructions (Addendum)
For nasal congestion/sinusitis: - continue Allegra - nasal saline rinses / netti pot twice a day - intranasal steroid like Flonase or Nasonex 1 spray each nostril daily - Ayr nasal gel or Aquafor or Vaseline for nosebleeds (apply to the nasal passages gentle with a Q-tip) - warm facial compresses - tylenol [phone] mg every 8 hours or Ibuprofen 400-600 mg every 6 hours as needed for pain/headaches  For your blood pressure: - Goal <140/90 - continue your Lisinopril every morning. Start Amlodipine. You can take them together with breakfast - monitor and log blood pressures at home - check around the same time each day in a relaxed setting - Limit salt to <2000 mg/day - Follow DASH eating plan - limit alcohol to 2 standard drinks per day for men and 1 per day for women - avoid tobacco products  DO NOT DRIVE WHILE YOU ARE EXPERIENCING LIGHTHEADEDNESS OR DIZZINESS   Dizziness Dizziness is a common problem. It is a feeling of unsteadiness or light-headedness. You may feel like you are about to faint. Dizziness can lead to injury if you stumble or fall. Anyone can become dizzy, but dizziness is more common in older adults. This condition can be caused by a number of things, including medicines, dehydration, or illness. Follow these instructions at home: Eating and drinking  Drink enough fluid to keep your urine clear or pale yellow. This helps to keep you from becoming dehydrated. Try to drink more clear fluids, such as water.  Do not drink alcohol.  Limit your caffeine intake if told to do so by your health care provider. Check ingredients and nutrition facts to see if a food or beverage contains caffeine.  Limit your salt (sodium) intake if told to do so by your health care provider. Check ingredients and nutrition facts to see if a food or beverage contains sodium. Activity  Avoid making quick movements. ? Rise slowly from chairs and steady yourself until you feel okay. ? In the  morning, first sit up on the side of the bed. When you feel okay, stand slowly while you hold onto something until you know that your balance is fine.  If you need to stand in one place for a long time, move your legs often. Tighten and relax the muscles in your legs while you are standing.  Do not drive or use heavy machinery if you feel dizzy.  Avoid bending down if you feel dizzy. Place items in your home so that they are easy for you to reach without leaning over. Lifestyle  Do not use any products that contain nicotine or tobacco, such as cigarettes and e-cigarettes. If you need help quitting, ask your health care provider.  Try to reduce your stress level by using methods such as yoga or meditation. Talk with your health care provider if you need help to manage your stress. General instructions  Watch your dizziness for any changes.  Take over-the-counter and prescription medicines only as told by your health care provider. Talk with your health care provider if you think that your dizziness is caused by a medicine that you are taking.  Tell a friend or a family member that you are feeling dizzy. If he or she notices any changes in your behavior, have this person call your health care provider.  Keep all follow-up visits as told by your health care provider. This is important. Contact a health care provider if:  Your dizziness does not go away.  Your dizziness or light-headedness gets  worse.  You feel nauseous.  You have reduced hearing.  You have new symptoms.  You are unsteady on your feet or you feel like the room is spinning. Get help right away if:  You vomit or have diarrhea and are unable to eat or drink anything.  You have problems talking, walking, swallowing, or using your arms, hands, or legs.  You feel generally weak.  You are not thinking clearly or you have trouble forming sentences. It may take a friend or family member to notice this.  You have chest  pain, abdominal pain, shortness of breath, or sweating.  Your vision changes.  You have any bleeding.  You have a severe headache.  You have neck pain or a stiff neck.  You have a fever. These symptoms may represent a serious problem that is an emergency. Do not wait to see if the symptoms will go away. Get medical help right away. Call your local emergency services (911 in the [place] Do not drive yourself to the hospital. Summary  Dizziness is a feeling of unsteadiness or light-headedness. This condition can be caused by a number of things, including medicines, dehydration, or illness.  Anyone can become dizzy, but dizziness is more common in older adults.  Drink enough fluid to keep your urine clear or pale yellow. Do not drink alcohol.  Avoid making quick movements if you feel dizzy. Monitor your dizziness for any changes. This information is not intended to replace advice given to you by your health care provider. Make sure you discuss any questions you have with your health care provider. Document Released: 08/11/2000 Document Revised: 03/20/2016 Document Reviewed: 03/20/2016 Elsevier Interactive Patient Education  [employer].

## 2017-08-10 ENCOUNTER — Encounter: Payer: Self-pay | Admitting: Physician Assistant

## 2017-08-17 ENCOUNTER — Telehealth: Payer: Self-pay | Admitting: Family Medicine

## 2017-08-17 NOTE — Telephone Encounter (Signed)
Pt left VM on triage line stating she has a question regarding her medication. Attempted callback - no answer. Left VM to return clinic call.

## 2017-08-17 NOTE — Progress Notes (Signed)
Subjective:    Patient ID: Christy Gutierrez, female    DOB: 1940-07-16, 77 y.o.   MRN: [id number]  HPI  HTN - f/u for new addition of amlodipine to BP regimen with recent uncontrolled BP. She hasn't taken her amlodipine in 2 days. Before that she had cut it in half.  She is still taking her lisinopril.  Bring in a log of home blood pressures most of which look good actually she has 2 that are elevated 1 at 144/72.  The pressures look good but she did not bring in her home cuff for comparison.  She felt like the amlodipine was actually making her feel even more lightheaded than she had been previously.  Intermittant lightheadedness - notices it more when movement and activities. Says she will almost anticipate the lightheadedness when she leaves the house. She feels like it may be related to anxiety.  She had panic attacks years ago.  Doesn't wake up with the feeling.   Still coughing up white thick mucous and lots of pressure in the back of her head.  Does feel better than she did when she came in that Monday.  She has not been on any antibiotics but has been using the saline rinse and using the Allegra in the mornings and that has helped some.  She is been feeling just very stressed and overwhelmed recently.  There [place] with 2 daughters who are teenagers and the mom keeps leaving for several weeks at a time and she feels somewhat responsible for them.  Her granddaughter who is been a rock in her life for years will actually be leaving to go to college this fall and she is concerned about that.  She also recently had the refill in her garage and has not been able to get it fixed yet.  Review of Systems  BP 124/82   Pulse 70   Ht 5\' 6"  (1.676 m)   Wt 143 lb (64.9 kg)   SpO2 98%   BMI 23.08 kg/m     Allergies  Allergen Reactions  . Clarithromycin     Past Medical History:  Diagnosis Date  . Back pain     Past Surgical History:  Procedure Laterality Date  .  TONSILLECTOMY    . TUBAL LIGATION     bilateral    Social History   Socioeconomic History  . Marital status: Widowed    Spouse name: Not on file  . Number of children: Not on file  . Years of education: Not on file  . Highest education level: Not on file  Occupational History  . Not on file  Social Needs  . Financial resource strain: Not on file  . Food insecurity:    Worry: Not on file    Inability: Not on file  . Transportation needs:    Medical: Not on file    Non-medical: Not on file  Tobacco Use  . Smoking status: Never Smoker  . Smokeless tobacco: Never Used  Substance and Sexual Activity  . Alcohol use: No  . Drug use: Not on file  . Sexual activity: Not on file  Lifestyle  . Physical activity:    Days per week: Not on file    Minutes per session: Not on file  . Stress: Not on file  Relationships  . Social connections:    Talks on phone: Not on file    Gets together: Not on file    Attends  religious service: Not on file    Active member of club or organization: Not on file    Attends meetings of clubs or organizations: Not on file    Relationship status: Not on file  . Intimate partner violence:    Fear of current or ex partner: Not on file    Emotionally abused: Not on file    Physically abused: Not on file    Forced sexual activity: Not on file  Other Topics Concern  . Not on file  Social History Narrative  . Not on file    Family History  Problem Relation Age of Onset  . Alcohol abuse Father   . Cancer Brother     Outpatient Encounter Medications as of 08/18/2017  Medication Sig  . Acetaminophen-Caffeine (EXCEDRIN TENSION HEADACHE) 500-65 MG TABS Take by mouth.  . Calcium Carb-Cholecalciferol [phone] MG-UNIT TABS Take by mouth.  . diclofenac sodium (VOLTAREN) 1 % GEL Apply 4 g topically 4 (four) times daily. To affected joint.  . fexofenadine (ALLEGRA) 180 MG tablet Take 1 tablet (180 mg total) by mouth at bedtime.  . fluticasone (FLONASE)  50 MCG/ACT nasal spray Place 1 spray into both nostrils daily.  Marland Kitchen gabapentin (NEURONTIN) 100 MG capsule Take 1-3 pills before bedtime for nerve pain.  Marland Kitchen ipratropium (ATROVENT) 0.06 % nasal spray Place 2 sprays into both nostrils every 4 (four) hours as needed for rhinitis.  Marland Kitchen lisinopril (PRINIVIL,ZESTRIL) 20 MG tablet Take 1 tablet (20 mg total) by mouth daily.  . Multiple Vitamins-Minerals (MULTIVITAMIN WITH MINERALS) tablet Take 1 tablet by mouth daily.  Marland Kitchen omega-3 acid ethyl esters (LOVAZA) 1 g capsule Take 2 capsules (2 g total) by mouth 2 (two) times daily.  . SUMAtriptan (IMITREX) 100 MG tablet TAKE AS DIRECTED AS NEEDED  . [DISCONTINUED] NONFORMULARY OR COMPOUNDED ITEM Home blood pressure monitor. Measure BP twice a day  . amLODipine (NORVASC) 5 MG tablet Take 1 tablet (5 mg total) by mouth daily. (Patient not taking: Reported on 08/18/2017)  . escitalopram (LEXAPRO) 10 MG tablet Take 1 tablet (10 mg total) by mouth daily.   No facility-administered encounter medications on file as of 08/18/2017.          Objective:   Physical Exam  Constitutional: She is oriented to person, place, and time. She appears well-developed and well-nourished.  HENT:  Head: Normocephalic and atraumatic.  Right Ear: External ear normal.  Left Ear: External ear normal.  Nose: Nose normal.  Mouth/Throat: Oropharynx is clear and moist.  TMs and canals are clear.   Eyes: Pupils are equal, round, and reactive to light. Conjunctivae and EOM are normal.  Neck: Neck supple. No thyromegaly present.  Cardiovascular: Normal rate, regular rhythm and normal heart sounds.  Pulmonary/Chest: Effort normal and breath sounds normal. She has no wheezes.  Lymphadenopathy:    She has no cervical adenopathy.  Neurological: She is alert and oriented to person, place, and time.  Skin: Skin is warm and dry.  Psychiatric: She has a normal mood and affect. Her behavior is normal.       Assessment & Plan:  HTN -  Uncontrolled today but Home BPs look good. Please keep blood pressure log and bring it in with you at the next office visit.  It will have to check it every day but if you can check it 2 or 3 times a week that would be great.  Intermittant dizziness -definitely think this is anxiety related based on her description of the symptoms  and how they occur.  She was gets a buildup to the dizziness and it only seems to happen when she is actually leaving her home.  It never happens inside the house or with regular daily activities.  And she is a fairly active person.  Getting to the point where she is getting nervous about leaving her home in general.  Sinus congestion-still likely allergic rhinitis.  She is improving and better than she was a week ago.  Still no fever or no colored discharge.  If she is not continuing to improve or suddenly gets worse then please call us back.  Anxiety - see note above. Discussed counseling and medication . Will start Lexapro and refer for therapy. Warned about S.E.F/U in 3 weeks.

## 2017-08-18 ENCOUNTER — Encounter: Payer: Self-pay | Admitting: Family Medicine

## 2017-08-18 ENCOUNTER — Ambulatory Visit (INDEPENDENT_AMBULATORY_CARE_PROVIDER_SITE_OTHER): Payer: Medicare Other | Admitting: Family Medicine

## 2017-08-18 VITALS — BP 124/82 | HR 70 | Ht 66.0 in | Wt 143.0 lb

## 2017-08-18 DIAGNOSIS — R42 Dizziness and giddiness: Secondary | ICD-10-CM

## 2017-08-18 DIAGNOSIS — F32A Depression, unspecified: Secondary | ICD-10-CM

## 2017-08-18 DIAGNOSIS — F419 Anxiety disorder, unspecified: Secondary | ICD-10-CM

## 2017-08-18 DIAGNOSIS — F329 Major depressive disorder, single episode, unspecified: Secondary | ICD-10-CM | POA: Diagnosis not present

## 2017-08-18 DIAGNOSIS — R0981 Nasal congestion: Secondary | ICD-10-CM

## 2017-08-18 DIAGNOSIS — I10 Essential (primary) hypertension: Secondary | ICD-10-CM

## 2017-08-18 MED ORDER — ESCITALOPRAM OXALATE 10 MG PO TABS: 10.0000 mg | ORAL_TABLET | Freq: Every day | ORAL | 0 refills | Status: DC

## 2017-08-18 NOTE — Patient Instructions (Addendum)
Please keep blood pressure log and bring it in with you at the next office visit.  It will have to check it every day but if you can check it 2 or 3 times a week that would be great.  Bring in your home blood pressure cuff with you as well if you were able to.  In regards to your sinuses if you do not continue to improve over the next week or 2 or if you suddenly get worse than please give us a call back.  We will start Lexapro 10 mg daily.

## 2017-08-25 ENCOUNTER — Telehealth: Payer: Self-pay

## 2017-08-25 MED ORDER — AMOXICILLIN-POT CLAVULANATE 875-125 MG PO TABS: 1.0000 | ORAL_TABLET | Freq: Two times a day (BID) | ORAL | 0 refills | Status: DC

## 2017-08-25 NOTE — Telephone Encounter (Signed)
Pt was seen on 08-18-17 for sinus issues. States she was told that sinuses were not bad enough for an RX at the time but if SX worsen she could have antibiotic.  Pt's complaints are of worsening sinus issues, some chills, sore throat, and productive cough. She did want me to let Dr Linford ArnoldMetheney know her BPs have been very good. Pt requesting RX.  Please advise.

## 2017-08-25 NOTE — Telephone Encounter (Signed)
rx sent to [facility] for ABX

## 2017-08-25 NOTE — Telephone Encounter (Signed)
Pt advised. No further needs at this time 

## 2017-09-04 ENCOUNTER — Other Ambulatory Visit: Payer: Self-pay | Admitting: Physician Assistant

## 2017-09-04 DIAGNOSIS — I10 Essential (primary) hypertension: Secondary | ICD-10-CM

## 2017-09-05 ENCOUNTER — Other Ambulatory Visit: Payer: Self-pay | Admitting: *Deleted

## 2017-09-12 ENCOUNTER — Ambulatory Visit (INDEPENDENT_AMBULATORY_CARE_PROVIDER_SITE_OTHER): Payer: Medicare Other | Admitting: Family Medicine

## 2017-09-12 ENCOUNTER — Encounter: Payer: Self-pay | Admitting: Family Medicine

## 2017-09-12 ENCOUNTER — Other Ambulatory Visit: Payer: Self-pay | Admitting: *Deleted

## 2017-09-12 VITALS — BP 128/72 | HR 66 | Ht 66.0 in | Wt 144.0 lb

## 2017-09-12 DIAGNOSIS — R058 Other specified cough: Secondary | ICD-10-CM

## 2017-09-12 DIAGNOSIS — F329 Major depressive disorder, single episode, unspecified: Secondary | ICD-10-CM

## 2017-09-12 DIAGNOSIS — F419 Anxiety disorder, unspecified: Secondary | ICD-10-CM

## 2017-09-12 DIAGNOSIS — R05 Cough: Secondary | ICD-10-CM

## 2017-09-12 DIAGNOSIS — I10 Essential (primary) hypertension: Secondary | ICD-10-CM | POA: Diagnosis not present

## 2017-09-12 MED ORDER — ESCITALOPRAM OXALATE 10 MG PO TABS: 10.0000 mg | ORAL_TABLET | Freq: Every day | ORAL | 1 refills | Status: DC

## 2017-09-12 NOTE — Progress Notes (Signed)
Subjective:    Patient ID: Christy Gutierrez, female    DOB: 15-Sep-1940, 77 y.o.   MRN: [id number]  HPI  Hypertension- Pt denies chest pain, SOB, dizziness, or heart palpitations.  Taking meds as directed w/o problems.  Denies medication side effects.    F/U GAD - started on Lexapro 3 weeks ago.  She says the first night that she took it she felt very sedated when she tried to get up and use the restroom and the next morning felt nauseated like she was in her vomit.  She did decrease it down to half a tab for couple weeks and is now up to a whole tab but has been tolerating it well.  Back she has not had any more of the dizzy episodes when she would leave the house like she was previously experiencing.  She said she did have one day when she was the passenger in a car and they got on the highway and suddenly felt extremely anxious like something was going to happen and she just worked on trying to calm herself down.  She did get a phone call about referral to the therapist but says the first available appointment is not until next week but she does plan to go.  In regards to her sinus infection that she had we did end up putting her on a round of antibiotics for 7 days which she did complete.  Her sinus symptoms seem much better but she still has a little bit of residual cough in the middle of her chest.  She is noticed that she is a little bit more short of breath with certain activities like vacuuming and little bit of chest discomfort.  But she says usually if she coughs and can get the phlegm to move out of her chest she feels better.  Review of Systems   BP 128/72   Pulse 66   Ht 5\' 6"  (1.676 m)   Wt 144 lb (65.3 kg)   SpO2 98%   BMI 23.24 kg/m     Allergies  Allergen Reactions  . Amlodipine Other (See Comments)  . Clarithromycin     Past Medical History:  Diagnosis Date  . Back pain     Past Surgical History:  Procedure Laterality Date  . TONSILLECTOMY    . TUBAL LIGATION      bilateral    Social History   Socioeconomic History  . Marital status: Widowed    Spouse name: Not on file  . Number of children: Not on file  . Years of education: Not on file  . Highest education level: Not on file  Occupational History  . Not on file  Social Needs  . Financial resource strain: Not on file  . Food insecurity:    Worry: Not on file    Inability: Not on file  . Transportation needs:    Medical: Not on file    Non-medical: Not on file  Tobacco Use  . Smoking status: Never Smoker  . Smokeless tobacco: Never Used  Substance and Sexual Activity  . Alcohol use: No  . Drug use: Not on file  . Sexual activity: Not on file  Lifestyle  . Physical activity:    Days per week: Not on file    Minutes per session: Not on file  . Stress: Not on file  Relationships  . Social connections:    Talks on phone: Not on file    Gets together: Not on file  Attends religious service: Not on file    Active member of club or organization: Not on file    Attends meetings of clubs or organizations: Not on file    Relationship status: Not on file  . Intimate partner violence:    Fear of current or ex partner: Not on file    Emotionally abused: Not on file    Physically abused: Not on file    Forced sexual activity: Not on file  Other Topics Concern  . Not on file  Social History Narrative  . Not on file    Family History  Problem Relation Age of Onset  . Alcohol abuse Father   . Cancer Brother     Outpatient Encounter Medications as of 09/12/2017  Medication Sig  . Acetaminophen-Caffeine (EXCEDRIN TENSION HEADACHE) 500-65 MG TABS Take by mouth.  . Calcium Carb-Cholecalciferol [phone] MG-UNIT TABS Take by mouth.  . diclofenac sodium (VOLTAREN) 1 % GEL Apply 4 g topically 4 (four) times daily. To affected joint.  Marland Kitchen. escitalopram (LEXAPRO) 10 MG tablet Take 1 tablet (10 mg total) by mouth daily.  . fexofenadine (ALLEGRA) 180 MG tablet Take 1 tablet (180 mg total) by  mouth at bedtime.  . fluticasone (FLONASE) 50 MCG/ACT nasal spray Place 1 spray into both nostrils daily.  Marland Kitchen. gabapentin (NEURONTIN) 100 MG capsule Take 1-3 pills before bedtime for nerve pain.  Marland Kitchen. ipratropium (ATROVENT) 0.06 % nasal spray Place 2 sprays into both nostrils every 4 (four) hours as needed for rhinitis.  Marland Kitchen. lisinopril (PRINIVIL,ZESTRIL) 20 MG tablet Take 1 tablet (20 mg total) by mouth daily.  . Multiple Vitamins-Minerals (MULTIVITAMIN WITH MINERALS) tablet Take 1 tablet by mouth daily.  Marland Kitchen. omega-3 acid ethyl esters (LOVAZA) 1 g capsule Take 2 capsules (2 g total) by mouth 2 (two) times daily.  . SUMAtriptan (IMITREX) 100 MG tablet TAKE AS DIRECTED AS NEEDED  . [DISCONTINUED] amLODipine (NORVASC) 5 MG tablet TAKE 1 TABLET BY MOUTH EVERY DAY   No facility-administered encounter medications on file as of 09/12/2017.          Objective:   Physical Exam  Constitutional: She is oriented to person, place, and time. She appears well-developed and well-nourished.  HENT:  Head: Normocephalic and atraumatic.  Cardiovascular: Normal rate, regular rhythm and normal heart sounds.  Pulmonary/Chest: Effort normal and breath sounds normal.  Neurological: She is alert and oriented to person, place, and time.  Skin: Skin is warm and dry.  Psychiatric: She has a normal mood and affect. Her behavior is normal.        Assessment & Plan:  Hypertension-well-controlled today it looks fantastic.  She did bring in her home cuff as well.  Home blood pressures mostly running in the systolic 120s.  Generalized anxiety disorder-continue with Lexapro for now I feel like she is doing well on it and overall her anxiety seems to be improved.  Hopefully she is able to get in with a therapist next week.  Follow-up in 3 months.  Postinfectious cough-gave reassurance.  If after couple weeks if not improving then recommend chest x-ray for further evaluation but her sinus symptoms have resolved.

## 2017-09-14 ENCOUNTER — Other Ambulatory Visit: Payer: Self-pay | Admitting: Family Medicine

## 2017-09-20 ENCOUNTER — Encounter (HOSPITAL_COMMUNITY): Payer: Self-pay | Admitting: Licensed Clinical Social Worker

## 2017-09-20 ENCOUNTER — Ambulatory Visit (INDEPENDENT_AMBULATORY_CARE_PROVIDER_SITE_OTHER): Payer: Medicare Other | Admitting: Licensed Clinical Social Worker

## 2017-09-20 DIAGNOSIS — F419 Anxiety disorder, unspecified: Secondary | ICD-10-CM

## 2017-09-20 NOTE — Progress Notes (Signed)
Comprehensive Clinical Assessment (CCA) Note  09/20/2017 Christy Gutierrez [id number]  Visit Diagnosis:      ICD-10-CM   1. Anxiety F41.9       CCA Part One  Part One has been completed on paper by the patient.  (See scanned document in Chart Review)  CCA Part Two A  Intake/Chief Complaint:  CCA Intake With Chief Complaint CCA Part Two Date: 09/20/17 CCA Part Two Time: 1314 Chief Complaint/Presenting Problem: Referred by PCP, Dr Linford ArnoldMetheney for concerns related to anxiety Patients Currently Reported Symptoms/Problems: Patient explains about a month and a half ago she had an episode of panic while driving on the highway.  For a period of time she had anxiety about getting in the car.  Noted in the weeks prior to the panic attack she had been having spells of lightheadedness.  She started to dwell on thoughts that life wasn't worth living if she was going to be confined to her home.  Reports her anxiety has lessened greatly in the past few weeks.  Described how she was able to change her thinking to be more positive and realistic.  She has continued driving.  Reported driving on the highway today with minimal anxiety.        Individual's Strengths: She has strong faith in God.  Likes to sing.  Good relationship with her children and grandchildren.  Has a close friend named Best boyButch.   Type of Services Patient Feels Are Needed: Not sure Initial Clinical Notes/Concerns: Patient started taking Lexapro on 08/18/17.  No previous use of psych meds.  Noted the past couple months have been more stressful than usual.  She has taken on the responsibility of caring for two teenage girls whose mother left them to pursue a job.  The family is from [place].  A year ago they didn't have a place to live because the man their mom married left her.  Patient allowed the [place] to [place].  Patient is also stressed because the granddaughter she is closest with will be going off to college  next month.  She will miss spending time with her.    Mental Health Symptoms Depression:  Depression: N/A  Mania:  Mania: N/A  Anxiety:   Anxiety: Tension  Psychosis:  Psychosis: N/A  Trauma:  Trauma: N/A  Obsessions:  Obsessions: N/A  Compulsions:  Compulsions: N/A  Inattention:  Inattention: N/A  Hyperactivity/Impulsivity:  Hyperactivity/Impulsivity: N/A  Oppositional/Defiant Behaviors:  Oppositional/Defiant Behaviors: N/A  Borderline Personality:  Emotional Irregularity: N/A  Other Mood/Personality Symptoms:      Mental Status Exam Appearance and self-care  Stature:  Stature: Average  Weight:  Weight: Average weight  Clothing:  Clothing: Casual  Grooming:  Grooming: Normal  Cosmetic use:  Cosmetic Use: None  Posture/gait:  Posture/Gait: Normal  Motor activity:  Motor Activity: Not Remarkable  Sensorium  Attention:  Attention: Normal  Concentration:  Concentration: Normal  Orientation:  Orientation: X5  Recall/memory:  Recall/Memory: Normal  Affect and Mood  Affect:  Affect: Appropriate  Mood:  Mood: Euthymic  Relating  Eye contact:  Eye Contact: Normal  Facial expression:  Facial Expression: Responsive  Attitude toward examiner:  Attitude Toward Examiner: Cooperative  Thought and Language  Speech flow: Speech Flow: Normal  Thought content:  Thought Content: Appropriate to mood and circumstances  Preoccupation:     Hallucinations:     Organization:     Company secretaryxecutive Functions  Fund of Knowledge:  Fund of Knowledge: Average  Intelligence:  Intelligence: Average  Abstraction:  Abstraction: Normal  Judgement:  Judgement: Normal  Reality Testing:  Reality Testing: Adequate  Insight:  Insight: Good  Decision Making:  Decision Making: Normal  Social Functioning  Social Maturity:  Social Maturity: Responsible  Social Judgement:  Social Judgement: Normal  Stress  Stressors:  Stressors: Transitions  Coping Ability:  Coping Ability: Normal  Skill Deficits:     Supports:       Family and Psychosocial History: Family history Marital status: Widowed Widowed, when?: Her husband Aurther Loft died about 10 years ago Does patient have children?: Yes How many children?: 2 How is patient's relationship with their children?:  Son Tresa Endo (55)-lives in [place], judges horse shows, considering moving to [employer] or [place], Trina (51)-good relationship  Childhood History:  Childhood History Does patient have siblings?: Yes Description of patient's current relationship with siblings: One brother still living Did patient suffer any verbal/emotional/physical/sexual abuse as a child?: No Did patient suffer from severe childhood neglect?: No Has patient ever been sexually abused/assaulted/raped as an adolescent or adult?: No Was the patient ever a victim of a crime or a disaster?: No Witnessed domestic violence?: No Has patient been effected by domestic violence as an adult?: No  CCA Part Two B  Employment/Work Situation: Employment / Work English as a second language teacher is the longest time patient has a held a job?: 32 years 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  Likes to sing.    Exercise/Diet: Exercise/Diet Do You Exercise?: No Have You Gained or Lost A Significant Amount of Weight in the Past Six Months?: No Do You Follow a Special Diet?: No Do You Have Any Trouble Sleeping?: No  CCA Part Two C  Alcohol/Drug Use: Alcohol / Drug Use History of alcohol / drug use?: No history of alcohol / drug abuse                      CCA Part Three  ASAM's:  Six Dimensions of Multidimensional Assessment  Dimension 1:  Acute  Intoxication and/or Withdrawal Potential:     Dimension 2:  Biomedical Conditions and Complications:     Dimension 3:  Emotional, Behavioral, or Cognitive Conditions and Complications:     Dimension 4:  Readiness to Change:     Dimension 5:  Relapse, Continued use, or Continued Problem Potential:     Dimension 6:  Recovery/Living Environment:      Substance use Disorder (SUD)    Social Function:  Social Functioning Social Maturity: Responsible Social Judgement: Normal  Stress:  Stress Stressors: Transitions Coping Ability: Normal Patient Takes Medications The Way The Doctor Instructed?: Yes  Risk Assessment- Self-Harm Potential: Risk Assessment For Self-Harm Potential Thoughts of Self-Harm: No current thoughts Additional Comments for Self-Harm Potential: Denies history of self-harm  Risk Assessment -Dangerous to Others Potential: Risk Assessment For Dangerous to Others Potential Additional Comments for Danger to Others Potential: Denies history of harm to others  DSM5 Diagnoses: Patient Active Problem List   Diagnosis Date Noted  . Episodic lightheadedness 08/08/2017  . Essential hypertension 03/05/2016  . 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  . Left knee pain 04/02/2015  . Migraine 05/08/2013  .  Osteoporosis 11/10/2009  . UNSPECIFIED VITAMIN D DEFICIENCY 11/04/2009  . Hyperlipidemia 11/04/2009  . BACK PAIN, CHRONIC 11/04/2009  . FATIGUE 11/04/2009  . POSTMENOPAUSAL STATUS 11/04/2009      Recommendations for Services/Supports/Treatments:  No MH services needed at this time.  Therapist did provide patient with an overview of the concept of mindfulness and some pages to read about how practicing mindfulness can help you to cope more effectively with stress.      Marilu Favre

## 2017-10-02 ENCOUNTER — Other Ambulatory Visit: Payer: Self-pay | Admitting: Family Medicine

## 2017-10-02 DIAGNOSIS — I10 Essential (primary) hypertension: Secondary | ICD-10-CM

## 2017-12-01 ENCOUNTER — Ambulatory Visit (INDEPENDENT_AMBULATORY_CARE_PROVIDER_SITE_OTHER): Payer: Medicare Other | Admitting: Family Medicine

## 2017-12-01 ENCOUNTER — Encounter: Payer: Self-pay | Admitting: Family Medicine

## 2017-12-01 VITALS — BP 136/72 | HR 70 | Ht 66.0 in | Wt 145.0 lb

## 2017-12-01 DIAGNOSIS — J018 Other acute sinusitis: Secondary | ICD-10-CM

## 2017-12-01 MED ORDER — AMOXICILLIN-POT CLAVULANATE 875-125 MG PO TABS: 1.0000 | ORAL_TABLET | Freq: Two times a day (BID) | ORAL | 0 refills | Status: DC

## 2017-12-01 NOTE — Patient Instructions (Signed)
Sinus Rinse What is a sinus rinse? A sinus rinse is a home treatment. It rinses your sinuses with a mixture of salt and water (saline solution). Sinuses are air-filled spaces in your skull behind the bones of your face and forehead. They open into your nasal cavity. To do a sinus rinse, you will need:  Saline solution.  Neti pot or spray bottle. This releases the saline solution into your nose and through your sinuses. You can buy neti pots and spray bottles at: ? Your local pharmacy. ? A health food store. ? Online.  When should I do a sinus rinse? A sinus rinse can help to clear your nasal cavity. It can clear:  Mucus.  Dirt.  Dust.  Pollen.  You may do a sinus rinse when you have:  A cold.  A virus.  Allergies.  A sinus infection.  A stuffy nose.  If you are considering a sinus rinse:  Ask your child's doctor before doing a sinus rinse on your child.  Do not do a sinus rinse if you have had: ? Ear or nasal surgery. ? An ear infection. ? Blocked ears.  How do I do a sinus rinse?  Wash your hands.  Disinfect your device using the directions that came with the device.  Dry your device.  Use the solution that comes with your device or one that is sold separately in stores. Follow the mixing directions on the package.  Fill your device with the amount of saline solution as stated in the device instructions.  Stand over a sink and tilt your head sideways over the sink.  Place the spout of the device in your upper nostril (the one closer to the ceiling).  Gently pour or squeeze the saline solution into the nasal cavity. The liquid should drain to the lower nostril if you are not too congested.  Gently blow your nose. Blowing too hard may cause ear pain.  Repeat in the other nostril.  Clean and rinse your device with clean water.  Air-dry your device. Are there risks of a sinus rinse? Sinus rinse is normally very safe and helpful. However, there are a  few risks, which include:  A burning feeling in the sinuses. This may happen if you do not make the saline solution as instructed. Make sure to follow all directions when making the saline solution.  Infection from unclean water. This is rare, but possible.  Nasal irritation.  This information is not intended to replace advice given to you by your health care provider. Make sure you discuss any questions you have with your health care provider. Document Released: 09/12/2013 Document Revised: 01/13/2016 Document Reviewed: 07/03/2013 Elsevier Interactive Patient Education  2017 Elsevier Inc.  

## 2017-12-01 NOTE — Progress Notes (Signed)
   Subjective:    Patient ID: Christy Gutierrez, female    DOB: 1940-03-10, 77 y.o.   MRN: [id number]  HPI 77 yo female comes in today c/o of mild cough, congestion, and post nasal drip and HA for one week. Using Allegra and Excedrin.  No fever, sweats or chills.  + sneezing and feels like her throat is "thick".  She is losing her voice as well.     Review of Systems     Objective:   Physical Exam  Constitutional: She is oriented to person, place, and time. She appears well-developed and well-nourished.  HENT:  Head: Normocephalic and atraumatic.  Cardiovascular: Normal rate, regular rhythm and normal heart sounds.  Pulmonary/Chest: Effort normal and breath sounds normal.  Neurological: She is alert and oriented to person, place, and time.  Skin: Skin is warm and dry.  Psychiatric: She has a normal mood and affect. Her behavior is normal.          Assessment & Plan:  Sinusitis, acute -viral versus bacterial.  At this point she is had the symptoms for just shy of a week.  We discussed continuing symptomatic care with warm tea, salt water gargles and nasal saline rinse as well as the Allegra and if not improving in the next couple days and okay to fill the prescription for Augmentin.  Call if not significantly better in 1 week.

## 2017-12-22 ENCOUNTER — Other Ambulatory Visit: Payer: Self-pay | Admitting: Family Medicine

## 2018-03-06 DIAGNOSIS — H25813 Combined forms of age-related cataract, bilateral: Secondary | ICD-10-CM | POA: Diagnosis not present

## 2018-03-06 DIAGNOSIS — H527 Unspecified disorder of refraction: Secondary | ICD-10-CM | POA: Diagnosis not present

## 2018-03-14 ENCOUNTER — Encounter: Payer: Self-pay | Admitting: Family Medicine

## 2018-03-14 ENCOUNTER — Ambulatory Visit (INDEPENDENT_AMBULATORY_CARE_PROVIDER_SITE_OTHER): Payer: Medicare Other

## 2018-03-14 ENCOUNTER — Ambulatory Visit (INDEPENDENT_AMBULATORY_CARE_PROVIDER_SITE_OTHER): Payer: Medicare Other | Admitting: Family Medicine

## 2018-03-14 VITALS — BP 136/82 | HR 75 | Temp 98.1°F | Ht 66.0 in | Wt 148.0 lb

## 2018-03-14 DIAGNOSIS — R059 Cough, unspecified: Secondary | ICD-10-CM

## 2018-03-14 DIAGNOSIS — R05 Cough: Secondary | ICD-10-CM

## 2018-03-14 DIAGNOSIS — J8410 Pulmonary fibrosis, unspecified: Secondary | ICD-10-CM | POA: Diagnosis not present

## 2018-03-14 DIAGNOSIS — R9389 Abnormal findings on diagnostic imaging of other specified body structures: Secondary | ICD-10-CM | POA: Diagnosis not present

## 2018-03-14 DIAGNOSIS — R911 Solitary pulmonary nodule: Secondary | ICD-10-CM

## 2018-03-14 DIAGNOSIS — R0602 Shortness of breath: Secondary | ICD-10-CM | POA: Diagnosis not present

## 2018-03-14 MED ORDER — ESCITALOPRAM OXALATE 10 MG PO TABS: 10.0000 mg | ORAL_TABLET | Freq: Every day | ORAL | 1 refills | Status: DC

## 2018-03-14 NOTE — Progress Notes (Signed)
Subjective:    Patient ID: Christy Gutierrez, female    DOB: [birth year], 78 y.o.   MRN: [id number]  HPI 78 year old female comes in today for cough, x 4 days taking tylenol, she has had a low grade fever, coughing up dark mucous. she feels that it is mostly in her chest and it hurts for her to cough, says has been around her daughter who has had a bad cough x 2 weeks.  Feels like she may have run a low-grade temperature at night.  She says that her throat feels sore from coughing as well as her chest.  Very runny nose infectious that she feels like she is gone through a couple boxes of Kleenex just in the last couple of days.  She is had a headache as well.   Review of Systems     BP 136/82   Pulse 75   Temp 98.1 F (36.7 C)   Ht 5\' 6"  (1.676 m)   Wt 148 lb (67.1 kg)   SpO2 98%   BMI 23.89 kg/m     Allergies  Allergen Reactions  . Amlodipine Other (See Comments)  . Clarithromycin     Past Medical History:  Diagnosis Date  . Back pain     Past Surgical History:  Procedure Laterality Date  . TONSILLECTOMY    . TUBAL LIGATION     bilateral    Social History   Socioeconomic History  . Marital status: Widowed    Spouse name: Not on file  . Number of children: Not on file  . Years of education: Not on file  . Highest education level: Not on file  Occupational History  . Not on file  Social Needs  . Financial resource strain: Not on file  . Food insecurity:    Worry: Not on file    Inability: Not on file  . Transportation needs:    Medical: Not on file    Non-medical: Not on file  Tobacco Use  . Smoking status: Never Smoker  . Smokeless tobacco: Never Used  Substance and Sexual Activity  . Alcohol use: No  . Drug use: Not on file  . Sexual activity: Not on file  Lifestyle  . Physical activity:    Days per week: Not on file    Minutes per session: Not on file  . Stress: Not on file  Relationships  . Social connections:    Talks on phone: Not on file    Gets  together: Not on file    Attends religious service: Not on file    Active member of club or organization: Not on file    Attends meetings of clubs or organizations: Not on file    Relationship status: Not on file  . Intimate partner violence:    Fear of current or ex partner: Not on file    Emotionally abused: Not on file    Physically abused: Not on file    Forced sexual activity: Not on file  Other Topics Concern  . Not on file  Social History Narrative  . Not on file    Family History  Problem Relation Age of Onset  . Alcohol abuse Father   . Cancer Brother     Outpatient Encounter Medications as of 03/14/2018  Medication Sig  . Acetaminophen-Caffeine (EXCEDRIN TENSION HEADACHE) 500-65 MG TABS Take by mouth.  . Calcium Carb-Cholecalciferol [phone] MG-UNIT TABS Take by mouth.  . diclofenac sodium (VOLTAREN) 1 % GEL Apply 4 g  topically 4 (four) times daily. To affected joint.  Marland Kitchen escitalopram (LEXAPRO) 10 MG tablet Take 1 tablet (10 mg total) by mouth daily.  . fexofenadine (ALLEGRA) 180 MG tablet Take 1 tablet (180 mg total) by mouth at bedtime.  . fluticasone (FLONASE) 50 MCG/ACT nasal spray Place 1 spray into both nostrils daily.  Marland Kitchen gabapentin (NEURONTIN) 100 MG capsule Take 1-3 pills before bedtime for nerve pain.  Marland Kitchen ipratropium (ATROVENT) 0.06 % nasal spray Place 2 sprays into both nostrils every 4 (four) hours as needed for rhinitis.  Marland Kitchen lisinopril (PRINIVIL,ZESTRIL) 20 MG tablet TAKE 1 TABLET DAILY  . Multiple Vitamins-Minerals (MULTIVITAMIN WITH MINERALS) tablet Take 1 tablet by mouth daily.  Marland Kitchen omega-3 acid ethyl esters (LOVAZA) 1 g capsule Take 2 capsules (2 g total) by mouth 2 (two) times daily.  . SUMAtriptan (IMITREX) 100 MG tablet TAKE AS DIRECTED AS NEEDED  . [DISCONTINUED] escitalopram (LEXAPRO) 10 MG tablet TAKE 1 TABLET DAILY  . [DISCONTINUED] amoxicillin-clavulanate (AUGMENTIN) 875-125 MG tablet Take 1 tablet by mouth 2 (two) times daily.   No  facility-administered encounter medications on file as of 03/14/2018.       Objective:   Physical Exam Constitutional:      Appearance: She is well-developed.  HENT:     Head: Normocephalic and atraumatic.     Right Ear: External ear normal.     Left Ear: External ear normal.     Nose: Nose normal.  Eyes:     Conjunctiva/sclera: Conjunctivae normal.     Pupils: Pupils are equal, round, and reactive to light.  Neck:     Musculoskeletal: Neck supple.     Thyroid: No thyromegaly.  Cardiovascular:     Rate and Rhythm: Normal rate and regular rhythm.     Heart sounds: Normal heart sounds.  Pulmonary:     Effort: Pulmonary effort is normal.     Breath sounds: No wheezing.     Comments: + diffuse rhonchi.  Lymphadenopathy:     Cervical: No cervical adenopathy.  Skin:    General: Skin is warm and dry.  Neurological:     Mental Status: She is alert and oriented to person, place, and time.       Assessment & Plan:  Cough-suspect bronchitis versus pneumonia.  Unfortunately I was unable to get a great lung exam.  She would start coughing pretty severely with every deep breath that she attempted to take.  When to get a chest x-ray to evaluate for pneumonia.  Will call with results within a couple of hours.  This x-ray did not reveal any pneumonia but did show emphysematous changes and fibrosis of the lungs.  There was also a 7 mm nodular opacity in the right upper lung that is probably vascular but they did recommend a CT to exclude pulmonary nodule.  Thus I treated her as a COPD exacerbation and sent a prescription for prednisone and antibiotic.  Will need spirometry in the future.  Pulmonary nodule seen on chest x-ray-we will follow-up the pulmonary nodule with CT.

## 2018-03-15 MED ORDER — PREDNISONE 20 MG PO TABS: 20.0000 mg | ORAL_TABLET | Freq: Every day | ORAL | 0 refills | Status: DC

## 2018-03-15 MED ORDER — DOXYCYCLINE HYCLATE 100 MG PO TABS: 100.0000 mg | ORAL_TABLET | Freq: Two times a day (BID) | ORAL | 0 refills | Status: DC

## 2018-03-17 ENCOUNTER — Encounter: Payer: Self-pay | Admitting: Family Medicine

## 2018-03-28 ENCOUNTER — Encounter: Payer: Self-pay | Admitting: Family Medicine

## 2018-03-28 ENCOUNTER — Ambulatory Visit (INDEPENDENT_AMBULATORY_CARE_PROVIDER_SITE_OTHER): Payer: Medicare Other | Admitting: Family Medicine

## 2018-03-28 VITALS — BP 137/73 | HR 65 | Ht 66.0 in | Wt 145.0 lb

## 2018-03-28 DIAGNOSIS — J439 Emphysema, unspecified: Secondary | ICD-10-CM | POA: Diagnosis not present

## 2018-03-28 DIAGNOSIS — H938X1 Other specified disorders of right ear: Secondary | ICD-10-CM | POA: Diagnosis not present

## 2018-03-28 DIAGNOSIS — I10 Essential (primary) hypertension: Secondary | ICD-10-CM | POA: Diagnosis not present

## 2018-03-28 NOTE — Progress Notes (Signed)
Subjective:    CC: BP  HPI:  Hypertension- Pt denies chest pain, SOB, dizziness, or heart palpitations.  Taking meds as directed w/o problems.  Denies medication side effects.    She was also recently seen about 2 weeks ago for cough and congestion.  We did do a chest x-ray at the time which showed a questionable pulmonary nodule, approximately 7 mm in the right upper lobe.  This it could also be vascular.  They also noticed some emphysematous changes and fibrosis in the lungs.  We had recommended a CT for further evaluation but she declined at this time.  He is feeling much better overall and feels like the antibiotic was really helpful.    She still having some persistent fullness in her right ear.  He says he had problems on and off with pressure in the ear since she flew about 3 years ago.  But in the last 2 months it has been much more persistent.  She says it just feels like a fullness and a pressure.  She says it really started before she got sick and has persisted is been going on now for couple of months.  Feels like when you go up in the mountains in your ear needs to pop.  She denies any drainage from the ear or significant pain.  She does not take any medication for it.  Noticed that she has been turning the TV up louder she is not sure if it is from hearing loss from that ear or not or just from "getting older".   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   Past medical history, Surgical history, Family history not pertinant except as noted below, Social history, Allergies, and medications have been entered into the medical record, reviewed, and corrections made.   Review of Systems: No fevers, chills, night sweats, weight loss, chest pain, or shortness of breath.   Objective:    General: Well Developed, well nourished,  and in no acute distress.  Neuro: Alert and oriented x3, extra-ocular muscles intact, sensation grossly intact.  HEENT: Normocephalic, atraumati, TMs and canals are clear bilaterally. Skin: Warm and dry, no rashes. Cardiac: Regular rate and rhythm, no murmurs rubs or gallops, no lower extremity edema.  Respiratory: Clear to auscultation bilaterally. Not using accessory muscles, speaking in full sentences.   Impression and Recommendations:    HTN -  Well controlled. Continue current regimen. Follow up in  6 moths.    Right ear fullness -exam is completely normal.  And tympanometry is technically normal but her peak pressure was -130 which was very different from her left ear which actually showed a wide tympanogram.  At this point I like to refer her to ENT for further work-up.  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  I like for her to come in in about a month for spirometry.  She just completed her antibiotics a couple of days ago so I give her a month to get back into her normal routine and then check her spirometry.  For now she declines CT for the possible pulmonary nodule.

## 2018-03-29 ENCOUNTER — Other Ambulatory Visit: Payer: Self-pay | Admitting: Family Medicine

## 2018-03-29 DIAGNOSIS — E781 Pure hyperglyceridemia: Secondary | ICD-10-CM

## 2018-03-29 DIAGNOSIS — I10 Essential (primary) hypertension: Secondary | ICD-10-CM

## 2018-04-17 DIAGNOSIS — H838X3 Other specified diseases of inner ear, bilateral: Secondary | ICD-10-CM | POA: Diagnosis not present

## 2018-04-17 DIAGNOSIS — H903 Sensorineural hearing loss, bilateral: Secondary | ICD-10-CM | POA: Diagnosis not present

## 2018-04-17 DIAGNOSIS — H6981 Other specified disorders of Eustachian tube, right ear: Secondary | ICD-10-CM | POA: Diagnosis not present

## 2018-04-25 ENCOUNTER — Ambulatory Visit (INDEPENDENT_AMBULATORY_CARE_PROVIDER_SITE_OTHER): Payer: Medicare Other | Admitting: Family Medicine

## 2018-04-25 VITALS — BP 131/63 | HR 66 | Resp 17 | Ht 66.0 in | Wt 145.0 lb

## 2018-04-25 DIAGNOSIS — J439 Emphysema, unspecified: Secondary | ICD-10-CM

## 2018-04-25 MED ORDER — UMECLIDINIUM BROMIDE 62.5 MCG/INH IN AEPB: 1.0000 | INHALATION_SPRAY | RESPIRATORY_TRACT | 5 refills | Status: DC

## 2018-04-25 MED ORDER — ALBUTEROL SULFATE HFA 108 (90 BASE) MCG/ACT IN AERS: 2.0000 | INHALATION_SPRAY | Freq: Four times a day (QID) | RESPIRATORY_TRACT | 2 refills | Status: DC | PRN

## 2018-04-25 MED ORDER — ALBUTEROL SULFATE (2.5 MG/3ML) 0.083% IN NEBU
2.5000 mg | INHALATION_SOLUTION | Freq: Once | RESPIRATORY_TRACT | Status: AC
  Administered 2018-04-25: 2.5 mg via RESPIRATORY_TRACT

## 2018-04-25 NOTE — Progress Notes (Signed)
Subjective:    CC:   HPI:  From note from [place]:  "She was also recently seen about 2 weeks ago for cough and congestion.  We did do a chest x-ray at the time which showed a questionable pulmonary nodule, approximately 7 mm in the right upper lobe.  This it could also be vascular.  They also noticed some emphysematous changes and fibrosis in the lungs.  We had recommended a CT for further evaluation but she declined at this time.  He is feeling much better overall and feels like the antibiotic was really helpful".  She is here today for spirometry for further work-up and evaluation.  Did complete her antibiotics.  And I gave her a month to improve to get back to baseline.  Before becoming ill she had noticed at times that she was getting short of breath.  For example she [place] to the [employer].  She says sometimes she can do it without difficulty and then other times she has to stop multiple times to rest.  When she rests she catches her breath she feels better and she can continue.  She never experiences chest pain with it but describes them as "weak spells".  She is also noticed that she had not been able to hold a note as long with singing.  She felt like that had been getting worse over the last couple of years.  Past medical history, Surgical history, Family history not pertinant except as noted below, Social history, Allergies, and medications have been entered into the medical record, reviewed, and corrections made.   Review of Systems: No fevers, chills, night sweats, weight loss, chest pain, or shortness of breath.   Objective:    General: Well Developed, well nourished, and in no acute distress.  Neuro: Alert and oriented x3, extra-ocular muscles intact, sensation grossly intact.  HEENT: Normocephalic, atraumatic  Skin: Warm and dry, no rashes. Cardiac: Regular rate and rhythm, no murmurs rubs or gallops, no lower extremity edema.  Respiratory: Clear  to auscultation bilaterally. Not using accessory muscles, speaking in full sentences.   Impression and Recommendations:     COPD -spirometry performed today.  FVC of 85% with an FEV1 of 74% with a ratio of 65%.  No significant change or improvement in FEV1 after albuterol.  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  Discussed treatment options including as needed albuterol, yearly flu vaccine, and avoiding triggers.  We also discussed because of her recent symptoms putting her on an anticholinergic daily.  I will see her back in 6 to 8 weeks and at that point in time I can have her complete a CAT score assessment for her COPD.

## 2018-05-04 NOTE — Addendum Note (Signed)
Addended by: Mallie Snooks R on: 05/04/2018 10:52 AM   Modules accepted: Orders

## 2018-06-26 ENCOUNTER — Ambulatory Visit (INDEPENDENT_AMBULATORY_CARE_PROVIDER_SITE_OTHER): Payer: Medicare Other | Admitting: Family Medicine

## 2018-06-26 ENCOUNTER — Encounter: Payer: Self-pay | Admitting: Family Medicine

## 2018-06-26 VITALS — BP 136/78 | HR 62 | Ht 66.0 in | Wt 145.0 lb

## 2018-06-26 DIAGNOSIS — F419 Anxiety disorder, unspecified: Secondary | ICD-10-CM

## 2018-06-26 DIAGNOSIS — I10 Essential (primary) hypertension: Secondary | ICD-10-CM | POA: Diagnosis not present

## 2018-06-26 DIAGNOSIS — F329 Major depressive disorder, single episode, unspecified: Secondary | ICD-10-CM

## 2018-06-26 DIAGNOSIS — J439 Emphysema, unspecified: Secondary | ICD-10-CM | POA: Diagnosis not present

## 2018-06-26 MED ORDER — ESCITALOPRAM OXALATE 10 MG PO TABS: 10.0000 mg | ORAL_TABLET | Freq: Every day | ORAL | 1 refills | Status: DC

## 2018-06-26 MED ORDER — UMECLIDINIUM BROMIDE 62.5 MCG/INH IN AEPB: 1.0000 | INHALATION_SPRAY | RESPIRATORY_TRACT | 4 refills | Status: DC

## 2018-06-26 NOTE — Progress Notes (Signed)
Pt went to go p/u he [facility] and did not pick this up was advised to get a 90 day supply of this medication. She usually gets this eery 90 days for $67. She  Wanted to talk to Dr. Linford Arnold about this before going to pick this up from the pharmacy.Heath Gold, CMA

## 2018-06-26 NOTE — Progress Notes (Signed)
Subjective:    CC: F/U new start Incruse.   HPI:  Follow-up for pulmonary emphysema-she was seen back in February for spirometry after completing a round of antibiotics for cough etc. earlier this year.  He tries to walk regularly for exercise.  Last COPD showed FVC of 85% with an FEV1 of 74% and a ratio of 65%.  She has gold stage II COPD.  Going to have her complete a CAT score today.  I also started her on Incruse.  He is actually been doing well on Incruse and has noticed that she has not had to use her albuterol.  She is noted she is been able to go up stairs more easily.  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  Hypertension- Pt denies chest pain, SOB, dizziness, or heart palpitations.  Taking meds as directed w/o problems.  Denies medication side effects.    Follow-up depression/anxiety-overall she is actually been doing really well.  She is requesting refill on her Lexapro.  No specific concerns today.   Past medical history, Surgical history, Family history not pertinant except as noted below, Social history, Allergies, and medications have been entered into the medical record, reviewed, and corrections made.   Review of Systems: No fevers, chills, night sweats, weight loss, chest pain, or shortness of breath.   Objective:    General: Well Developed, well nourished, and in no acute distress.  Neuro: Alert and oriented x3, extra-ocular muscles intact, sensation grossly intact.  HEENT: Normocephalic, atraumatic  Skin: Warm and dry, no rashes. Cardiac: Regular rate and rhythm, no murmurs rubs or gallops, no lower extremity edema.  Respiratory: Clear to auscultation bilaterally. Not using accessory muscles, speaking in full sentences.   Impression and Recommendations:    Pulmonary emphysema-overall doing well with Incruse.  Will send over 90-day supply it sounds like she had a problem with the  pharmacy but I think it was because they were requiring 90-day.  It still costing her 60+ dollars per month so encouraged her to call her insurance to see if there may be a different anticholinergic that is at a lower co-pay.  CAT score today of 5 she had positive response to phlegm, energy levels and shortness of breath going uphill on a flight of stairs.  If she is otherwise doing well we will see her back in 6 months did remind her I want her to get a flu shot this fall.  HTN - Well controlled. Continue current regimen. Follow up in  6 months.   Depression/anxiety-continue current regimen.  Refill sent for Lexapro.  Call if any problems or change in symptoms especially during the pandemic.

## 2018-07-04 ENCOUNTER — Other Ambulatory Visit: Payer: Self-pay | Admitting: Family Medicine

## 2018-07-10 ENCOUNTER — Telehealth: Payer: Self-pay

## 2018-07-10 NOTE — Telephone Encounter (Signed)
Patient called because she is unable to refill her Incruse Ellipta until June 6th, but she states she is due.   I called [facility] on [address] and spoke with Ala, who advised me that patient's insurance will only cover #[id number] in a 90 day period. Patient uses inhaler daily, 1 puff QAM. Per Ala, she thinks that we will need to contact insurance for a prior auth.   Francesco Runner, is this something that you can look into and contact insurance about?   Thanks!

## 2018-07-11 NOTE — Telephone Encounter (Signed)
I agree let us check on a prior authorization that seems really unusual since it is a daily medication and not a rescue medicine that they will pay for daily use.  It makes me wonder if they are running the medication improperly.

## 2018-07-13 MED ORDER — TIOTROPIUM BROMIDE MONOHYDRATE 2.5 MCG/ACT IN AERS: 1.0000 | INHALATION_SPRAY | Freq: Every day | RESPIRATORY_TRACT | 5 refills | Status: DC

## 2018-07-13 NOTE — Telephone Encounter (Signed)
Plase call pt and see if ok with the change.  I pended the med incase she says Ok. Then please send.

## 2018-07-13 NOTE — Telephone Encounter (Signed)
I was working on the PA for this and Spiriva is the preferred product. I do not see that she has been on this before. Do you have any recommendations? Please advise. Patient is ok with switching to another inhaler if needed.

## 2018-07-13 NOTE — Telephone Encounter (Signed)
Rx sent to pharmacy   

## 2018-07-14 ENCOUNTER — Other Ambulatory Visit: Payer: Self-pay | Admitting: Family Medicine

## 2018-07-21 ENCOUNTER — Other Ambulatory Visit: Payer: Self-pay

## 2018-07-21 MED ORDER — TIOTROPIUM BROMIDE MONOHYDRATE 2.5 MCG/ACT IN AERS: 1.0000 | INHALATION_SPRAY | Freq: Every day | RESPIRATORY_TRACT | 1 refills | Status: DC

## 2018-09-24 ENCOUNTER — Other Ambulatory Visit: Payer: Self-pay | Admitting: Family Medicine

## 2018-09-24 DIAGNOSIS — I10 Essential (primary) hypertension: Secondary | ICD-10-CM

## 2018-12-24 ENCOUNTER — Other Ambulatory Visit: Payer: Self-pay | Admitting: Family Medicine

## 2018-12-26 ENCOUNTER — Ambulatory Visit (INDEPENDENT_AMBULATORY_CARE_PROVIDER_SITE_OTHER): Payer: Medicare Other | Admitting: Family Medicine

## 2018-12-26 ENCOUNTER — Other Ambulatory Visit: Payer: Self-pay

## 2018-12-26 ENCOUNTER — Encounter: Payer: Self-pay | Admitting: Family Medicine

## 2018-12-26 VITALS — BP 134/66 | HR 69 | Ht 66.0 in | Wt 146.0 lb

## 2018-12-26 DIAGNOSIS — J439 Emphysema, unspecified: Secondary | ICD-10-CM

## 2018-12-26 DIAGNOSIS — F329 Major depressive disorder, single episode, unspecified: Secondary | ICD-10-CM | POA: Diagnosis not present

## 2018-12-26 DIAGNOSIS — I10 Essential (primary) hypertension: Secondary | ICD-10-CM

## 2018-12-26 DIAGNOSIS — F339 Major depressive disorder, recurrent, unspecified: Secondary | ICD-10-CM | POA: Insufficient documentation

## 2018-12-26 DIAGNOSIS — F419 Anxiety disorder, unspecified: Secondary | ICD-10-CM | POA: Diagnosis not present

## 2018-12-26 DIAGNOSIS — Z23 Encounter for immunization: Secondary | ICD-10-CM

## 2018-12-26 DIAGNOSIS — F32A Depression, unspecified: Secondary | ICD-10-CM

## 2018-12-26 MED ORDER — BETAMETHASONE VALERATE 0.1 % EX OINT: TOPICAL_OINTMENT | Freq: Every day | CUTANEOUS | 1 refills | Status: DC

## 2018-12-26 NOTE — Assessment & Plan Note (Signed)
Well controlled. Continue current regimen. Follow up in  6 months. Due for BMP.   

## 2018-12-26 NOTE — Progress Notes (Signed)
Established Patient Office Visit  Subjective:  Patient ID: Christy Gutierrez, female    DOB: 1940/06/08  Age: 78 y.o. MRN: [id number]  CC:  Chief Complaint  Patient presents with  . Hypertension  . lung check    HPI Christy Gutierrez presents for   Hypertension- Pt denies chest pain, SOB, dizziness, or heart palpitations.  Taking meds as directed w/o problems.  Denies medication side effects.    F/U emphysema -increase was not covered by her insurance so we switched to Spiriva.  Spirometry was in April.  16-month follow-up for depression/anxiety-currently on Lexapro.  Past Medical History:  Diagnosis Date  . Back pain     Past Surgical History:  Procedure Laterality Date  . TONSILLECTOMY    . TUBAL LIGATION     bilateral    Family History  Problem Relation Age of Onset  . Alcohol abuse Father   . Cancer Brother     Social History   Socioeconomic History  . Marital status: Widowed    Spouse name: Not on file  . Number of children: Not on file  . Years of education: Not on file  . Highest education level: Not on file  Occupational History  . Not on file  Social Needs  . Financial resource strain: Not on file  . Food insecurity    Worry: Not on file    Inability: Not on file  . Transportation needs    Medical: Not on file    Non-medical: Not on file  Tobacco Use  . Smoking status: Never Smoker  . Smokeless tobacco: Never Used  Substance and Sexual Activity  . Alcohol use: No  . Drug use: Not on file  . Sexual activity: Not on file  Lifestyle  . Physical activity    Days per week: Not on file    Minutes per session: Not on file  . Stress: Not on file  Relationships  . Social Herbalist on phone: Not on file    Gets together: Not on file    Attends religious service: Not on file    Active member of club or organization: Not on file    Attends meetings of clubs or organizations: Not on file    Relationship status: Not on file  . Intimate partner  violence    Fear of current or ex partner: Not on file    Emotionally abused: Not on file    Physically abused: Not on file    Forced sexual activity: Not on file  Other Topics Concern  . Not on file  Social History Narrative  . Not on file    Outpatient Medications Prior to Visit  Medication Sig Dispense Refill  . Acetaminophen-Caffeine (EXCEDRIN TENSION HEADACHE) 500-65 MG TABS Take by mouth.    Marland Kitchen albuterol (VENTOLIN HFA) 108 (90 Base) MCG/ACT inhaler TAKE 2 PUFFS BY MOUTH EVERY 6 HOURS AS NEEDED FOR WHEEZE OR SHORTNESS OF BREATH 6.7 Inhaler 2  . Calcium Carb-Cholecalciferol [phone] MG-UNIT TABS Take by mouth.    . diclofenac sodium (VOLTAREN) 1 % GEL Apply 4 g topically 4 (four) times daily. To affected joint. 100 g 11  . escitalopram (LEXAPRO) 10 MG tablet TAKE 1 TABLET BY MOUTH EVERY DAY 90 tablet 1  . fexofenadine (ALLEGRA) 180 MG tablet Take 1 tablet (180 mg total) by mouth at bedtime. 90 tablet 3  . fluticasone (FLONASE) 50 MCG/ACT nasal spray Place 1 spray into both nostrils daily. 16 g 3  .  gabapentin (NEURONTIN) 100 MG capsule Take 1-3 pills before bedtime for nerve pain. 90 capsule 3  . ipratropium (ATROVENT) 0.06 % nasal spray Place 2 sprays into both nostrils every 4 (four) hours as needed for rhinitis. 10 mL 6  . lisinopril (ZESTRIL) 20 MG tablet TAKE 1 TABLET DAILY 90 tablet 1  . Multiple Vitamins-Minerals (MULTIVITAMIN WITH MINERALS) tablet Take 1 tablet by mouth daily.    Marland Kitchen omega-3 acid ethyl esters (LOVAZA) 1 g capsule TAKE 2 CAPSULES (= 2GM     TOTAL) TWO TIMES A DAY 360 capsule 3  . SUMAtriptan (IMITREX) 100 MG tablet TAKE AS DIRECTED AS NEEDED 27 tablet 1  . Tiotropium Bromide Monohydrate (SPIRIVA RESPIMAT) 2.5 MCG/ACT AERS Inhale 1 Inhaler into the lungs daily. 12 g 1  . umeclidinium bromide (INCRUSE ELLIPTA) 62.5 MCG/INH AEPB Inhale 1 puff into the lungs every morning. 90 each 4   No facility-administered medications prior to visit.     Allergies  Allergen  Reactions  . Amlodipine Other (See Comments)  . Clarithromycin     ROS Review of Systems    Objective:    Physical Exam  Constitutional: She is oriented to person, place, and time. She appears well-developed and well-nourished.  HENT:  Head: Normocephalic and atraumatic.  Cardiovascular: Normal rate, regular rhythm and normal heart sounds.  Pulmonary/Chest: Effort normal and breath sounds normal.  Neurological: She is alert and oriented to person, place, and time.  Skin: Skin is warm and dry.  Psychiatric: She has a normal mood and affect. Her behavior is normal.    BP 134/66   Pulse 69   Ht 5\' 6"  (1.676 m)   Wt 146 lb (66.2 kg)   SpO2 99%   BMI 23.57 kg/m  Wt Readings from Last 3 Encounters:  12/26/18 146 lb (66.2 kg)  06/26/18 145 lb (65.8 kg)  04/25/18 145 lb (65.8 kg)     There are no preventive care reminders to display for this patient.  There are no preventive care reminders to display for this patient.  Lab Results  Component Value Date   TSH 2.04 04/21/2017   Lab Results  Component Value Date   WBC 5.0 09/07/2010   HGB 13.9 09/07/2010   HCT 42.1 09/07/2010   MCV 92.9 09/07/2010   PLT 145 (L) 09/07/2010   Lab Results  Component Value Date   NA 141 04/21/2017   K 4.0 04/21/2017   CO2 29 04/21/2017   GLUCOSE 96 04/21/2017   BUN 16 04/21/2017   CREATININE 0.71 04/21/2017   BILITOT 0.6 04/21/2017   ALKPHOS 55 11/21/2014   AST 22 04/21/2017   ALT 17 04/21/2017   PROT 6.7 04/21/2017   ALBUMIN 4.2 11/21/2014   CALCIUM 9.4 04/21/2017   Lab Results  Component Value Date   CHOL 219 (H) 04/21/2017   Lab Results  Component Value Date   HDL 86 04/21/2017   Lab Results  Component Value Date   LDLCALC 119 (H) 04/21/2017   Lab Results  Component Value Date   TRIG 45 04/21/2017   Lab Results  Component Value Date   CHOLHDL 2.5 04/21/2017   No results found for: HGBA1C    Assessment & Plan:   Problem List Items Addressed This Visit       Cardiovascular and Mediastinum   Essential hypertension - Primary    Well controlled. Continue current regimen. Follow up in  6 months. Due for BMP.        Relevant Orders  BASIC METABOLIC PANEL WITH GFR     Respiratory   Emphysema lung (HCC)    Stable.  Doing really well on her Spiriva.  Rarely has to use her albuterol.  Continue to follow.  Check again in 6 months.        Other   Anxiety and depression    Happy with her current regimen.  She stays very active.  She is up-to-date on her prescription it was just refilled for 6 months yesterday.       Other Visit Diagnoses    Need for immunization against influenza       Relevant Orders   Flu Vaccine QUAD High Dose(Fluad) (Completed)      Meds ordered this encounter  Medications  . betamethasone valerate ointment (VALISONE) 0.1 %    Sig: Apply topically daily.    Dispense:  45 g    Refill:  1    Follow-up: Return in about 6 months (around 06/26/2019) for Hypertension and labs. Nani Gasser.     , MD

## 2018-12-26 NOTE — Assessment & Plan Note (Signed)
Stable.  Doing really well on her Spiriva.  Rarely has to use her albuterol.  Continue to follow.  Check again in 6 months.

## 2018-12-26 NOTE — Assessment & Plan Note (Signed)
Happy with her current regimen.  She stays very active.  She is up-to-date on her prescription it was just refilled for 6 months yesterday.

## 2018-12-27 LAB — BASIC METABOLIC PANEL WITH GFR
BUN: 16 mg/dL (ref 7–25)
CO2: 28 mmol/L (ref 20–32)
Calcium: 9.6 mg/dL (ref 8.6–10.4)
Chloride: 104 mmol/L (ref 98–110)
Creat: 0.77 mg/dL (ref 0.60–0.88)
GFR, Est African American: 85 mL/min/{1.73_m2} (ref >=60)
GFR, Est Non African American: 73 mL/min/{1.73_m2} (ref >=60)
Glucose, Bld: 96 mg/dL (ref 65–99)
Potassium: 4.4 mmol/L (ref 3.5–5.3)
Sodium: 140 mmol/L (ref 135–146)

## 2018-12-27 NOTE — Progress Notes (Signed)
All labs are normal. 

## 2019-01-16 DIAGNOSIS — H524 Presbyopia: Secondary | ICD-10-CM | POA: Diagnosis not present

## 2019-01-16 DIAGNOSIS — H5203 Hypermetropia, bilateral: Secondary | ICD-10-CM | POA: Diagnosis not present

## 2019-01-16 DIAGNOSIS — H25813 Combined forms of age-related cataract, bilateral: Secondary | ICD-10-CM | POA: Diagnosis not present

## 2019-01-24 ENCOUNTER — Other Ambulatory Visit: Payer: Self-pay

## 2019-03-07 ENCOUNTER — Other Ambulatory Visit: Payer: Self-pay | Admitting: Family Medicine

## 2019-03-07 DIAGNOSIS — I10 Essential (primary) hypertension: Secondary | ICD-10-CM

## 2019-03-21 ENCOUNTER — Other Ambulatory Visit: Payer: Self-pay | Admitting: Family Medicine

## 2019-03-21 DIAGNOSIS — E781 Pure hyperglyceridemia: Secondary | ICD-10-CM

## 2019-04-23 NOTE — Progress Notes (Signed)
Subjective:   Christy Gutierrez is a 79 y.o. female who presents for Medicare Annual (Subsequent) preventive examination.  Review of Systems:  No ROS.  Medicare Wellness Virtual Visit.  Visual/audio telehealth visit, UTA vital signs.   See social history for additional risk factors.    Cardiac Risk Factors include: advanced age (>44men, >69 women);hypertension Sleep patterns: Getting 7 hours of sleep a night. Does not wake up to void during the night.Wakes up and feels rested.  Home Safety/Smoke Alarms: Feels safe in home. Smoke alarms in place.  Living environment; Lives alone in a 1 story home with full basement and stairs have hand rails on them.  Shower is a walk in shower and no grab bars in place. Seat Belt Safety/Bike Helmet: Wears seat belt.   Female:   Pap-  Aged out     Mammo- Aged out      Dexa scan- ordered while on phone       CCS- Aged out     Objective:     Vitals: BP 134/75   Pulse 67   Ht 5\' 6"  (1.676 m)   Wt 143 lb (64.9 kg)   BMI 23.08 kg/m   Body mass index is 23.08 kg/m.  Advanced Directives 05/01/2019 04/21/2016  Does Patient Have a Medical Advance Directive? Yes Yes  Type of 04/23/2016 of [place];Living will Healthcare Power of [place];Living will  Does patient want to make changes to medical advance directive? No - Patient declined -  Copy of Healthcare Power of Attorney in Chart? No - copy requested -    Tobacco Social History   Tobacco Use  Smoking Status Never Smoker  Smokeless Tobacco Never Used     Counseling given: No   Clinical Intake:  Pre-visit preparation completed: Yes  Pain : No/denies pain     Diabetes: No  How often do you need to have someone help you when you read instructions, pamphlets, or other written materials from your doctor or pharmacy?: 1 - Never What is the last grade level you completed in school?: 13  Interpreter Needed?: No  Information entered by :: [email or web address], LPN  Past  Medical History:  Diagnosis Date  . Back pain    Past Surgical History:  Procedure Laterality Date  . TONSILLECTOMY    . TUBAL LIGATION     bilateral   Family History  Problem Relation Age of Onset  . Alcohol abuse Father   . Cancer Brother    Social History   Socioeconomic History  . Marital status: Widowed    Spouse name: Not on file  . Number of children: 2  . Years of education: 36  . Highest education level: Some college, no degree  Occupational History  . Occupation: 14    Comment: retired  Tobacco Use  . Smoking status: Never Smoker  . Smokeless tobacco: Never Used  Substance and Sexual Activity  . Alcohol use: No  . Drug use: Never  . Sexual activity: Not Currently  Other Topics Concern  . 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 Difficulty of Paying Living Expenses: Not on file  Food Insecurity:   . Worried About Doroteo Glassman in the Last Year: Not on file  . Ran Out of Food in the Last Year: Not  on file  Transportation Needs:   . Lack of Transportation (Medical): Not on file  . Lack of Transportation (Non-Medical): Not on file  Physical Activity:   . Days of Exercise per Week: Not on file  . Minutes of Exercise per Session: Not on file  Stress:   . Feeling of Stress : Not on file  Social Connections:   . Frequency of Communication with Friends and Family: Not on file  . Frequency of Social Gatherings with Friends and Family: Not on file  . Attends Religious Services: Not on file  . Active Member of Clubs or Organizations: Not on file  . Attends Banker Meetings: Not on file  . Marital Status: Not on file    Outpatient Encounter Medications as of 05/01/2019  Medication Sig  . Acetaminophen-Caffeine (EXCEDRIN TENSION HEADACHE) 500-65 MG TABS Take by mouth.  Marland Kitchen albuterol (VENTOLIN HFA) 108  (90 Base) MCG/ACT inhaler TAKE 2 PUFFS BY MOUTH EVERY 6 HOURS AS NEEDED FOR WHEEZE OR SHORTNESS OF BREATH  . betamethasone valerate ointment (VALISONE) 0.1 % Apply topically daily.  . Calcium Carb-Cholecalciferol [phone] MG-UNIT TABS Take by mouth.  . diclofenac sodium (VOLTAREN) 1 % GEL Apply 4 g topically 4 (four) times daily. To affected joint.  Marland Kitchen escitalopram (LEXAPRO) 10 MG tablet TAKE 1 TABLET BY MOUTH EVERY DAY  . fexofenadine (ALLEGRA) 180 MG tablet Take 1 tablet (180 mg total) by mouth at bedtime.  . fluticasone (FLONASE) 50 MCG/ACT nasal spray Place 1 spray into both nostrils daily.  Marland Kitchen ipratropium (ATROVENT) 0.06 % nasal spray Place 2 sprays into both nostrils every 4 (four) hours as needed for rhinitis.  Marland Kitchen lisinopril (ZESTRIL) 20 MG tablet TAKE 1 TABLET DAILY  . Multiple Vitamins-Minerals (MULTIVITAMIN WITH MINERALS) tablet Take 1 tablet by mouth daily.  Marland Kitchen omega-3 acid ethyl esters (LOVAZA) 1 g capsule TAKE 2 CAPSULES (= 2GM     TOTAL) TWO TIMES A DAY  . SPIRIVA RESPIMAT 2.5 MCG/ACT AERS USE 1 INHALATION ORALLY    DAILY  . SUMAtriptan (IMITREX) 100 MG tablet TAKE AS DIRECTED AS NEEDED  . gabapentin (NEURONTIN) 100 MG capsule Take 1-3 pills before bedtime for nerve pain. (Patient not taking: Reported on 05/01/2019)   No facility-administered encounter medications on file as of 05/01/2019.    Activities of Daily Living In your present state of health, do you have any difficulty performing the following activities: 05/01/2019  Hearing? Y  Comment has noticed some hearing loss  Vision? N  Difficulty concentrating or making decisions? Y  Comment memory issues at times  Walking or climbing stairs? N  Dressing or bathing? N  Doing errands, shopping? N  Preparing Food and eating ? N  Using the Toilet? N  In the past six months, have you accidently leaked urine? N  Do you have problems with loss of bowel control? N  Managing your Medications? N  Managing your Finances? N  Housekeeping  or managing your Housekeeping? N  Some recent data might be hidden    Patient Care Team: Agapito Games, MD as PCP - General (Family Medicine)    Assessment:   This is a routine wellness examination for [place].Physical assessment deferred to PCP.  Exercise Activities and Dietary recommendations Current Exercise Habits: Home exercise routine, Type of exercise: stretching;walking(works daily on farm land), Time (Minutes): 10, Frequency (Times/Week): 7, Weekly Exercise (Minutes/Week): 70, Intensity: Mild, Exercise limited by: None identified Diet Eats a fairly healthy diet of vegetables, meats and proteins. Breakfast:  Eggs, toast with jelly, coffee and water. Lunch: banana sandwich or pimento cheese sandwich Dinner:  Meat and vegetables and beans Drinks water daily.     Goals    . Weight (lb) < 200 lb (90.7 kg) (pt-stated)     Would like to maintain her weight just would like to get rid of the "belly"       Fall Risk Fall Risk  05/01/2019 01/24/2019 08/18/2017 02/03/2017 02/05/2016  Falls in the past year? 0 0 No Yes No  Comment - Emmi Telephone Survey: data to providers prior to load - Emmi Telephone Survey: data to providers prior to load Emmi Telephone Survey: data to providers prior to load  Number falls in past yr: - - - 1 -  Comment - - - Emmi Telephone Survey Actual Response = 1 -  Injury with Fall? - - - No -  Risk for fall due to : No Fall Risks - - - -  Follow up Falls prevention discussed - - - -   Is the patient's home free of loose throw rugs in walkways, pet beds, electrical cords, etc?   yes      Grab bars in the bathroom? no      Handrails on the stairs?   yes      Adequate lighting?   yes   Depression Screen PHQ 2/9 Scores 05/01/2019 03/28/2018 09/12/2017 08/18/2017  PHQ - 2 Score 1 0 1 2  PHQ- 9 Score - 0 2 6  Some encounter information is confidential and restricted. Go to Review Flowsheets activity to see all data.     Cognitive Function     6CIT Screen  05/01/2019  What Year? 0 points  What month? 0 points  What time? 0 points  Count back from 20 0 points  Months in reverse 0 points  Repeat phrase 0 points  Total Score 0    Immunization History  Administered Date(s) Administered  . Fluad Quad(high Dose 65+) 12/26/2018  . Tdap 05/08/2013    Screening Tests Health Maintenance  Topic Date Due  . PNA vac Low Risk Adult (1 of 2 - PCV13) 09/28/2003  . TETANUS/TDAP  05/09/2023  . INFLUENZA VACCINE  Completed  . DEXA SCAN  Completed      Plan:    Please schedule your next medicare wellness visit with me in 1 yr.  Ms. Cadenhead , Thank you for taking time to come for your Medicare Wellness Visit. I appreciate your ongoing commitment to your health goals. Please review the following plan we discussed and let me know if I can assist you in the future.  Continue doing brain stimulating activities (puzzles, reading, adult coloring books, staying active) to keep memory sharp.  Bring a copy of your living will and/or healthcare power of attorney to your next office visit.   These are the goals we discussed: Goals    . Weight (lb) < 200 lb (90.7 kg) (pt-stated)     Would like to maintain her weight just would like to get rid of the "belly"       This is a list of the screening recommended for you and due dates:  Health Maintenance  Topic Date Due  . Pneumonia vaccines (1 of 2 - PCV13) 09/28/2003  . Tetanus Vaccine  05/09/2023  . Flu Shot  Completed  . DEXA scan (bone density measurement)  Completed      I have personally reviewed and noted the following in the patient's chart:   .  Medical and social history . Use of alcohol, tobacco or illicit drugs  . Current medications and supplements . Functional ability and status . Nutritional status . Physical activity . Advanced directives . List of other physicians . Hospitalizations, surgeries, and ER visits in previous 12 months . Vitals . Screenings to include cognitive,  depression, and falls . Referrals and appointments  In addition, I have reviewed and discussed with patient certain preventive protocols, quality metrics, and best practice recommendations. A written personalized care plan for preventive services as well as general preventive health recommendations were provided to patient.     Joanne Chars, LPN  03/04/4816

## 2019-05-01 ENCOUNTER — Ambulatory Visit (INDEPENDENT_AMBULATORY_CARE_PROVIDER_SITE_OTHER): Payer: Medicare Other | Admitting: *Deleted

## 2019-05-01 VITALS — BP 134/75 | HR 67 | Ht 66.0 in | Wt 143.0 lb

## 2019-05-01 DIAGNOSIS — Z13820 Encounter for screening for osteoporosis: Secondary | ICD-10-CM

## 2019-05-01 DIAGNOSIS — Z0000 Encounter for general adult medical examination without abnormal findings: Secondary | ICD-10-CM | POA: Diagnosis not present

## 2019-05-01 DIAGNOSIS — Z780 Asymptomatic menopausal state: Secondary | ICD-10-CM | POA: Diagnosis not present

## 2019-05-01 NOTE — Patient Instructions (Addendum)
Please schedule your next medicare wellness visit with me in 1 yr.  Christy Gutierrez , Thank you for taking time to come for your Medicare Wellness Visit. I appreciate your ongoing commitment to your health goals. Please review the following plan we discussed and let me know if I can assist you in the future.  Continue doing brain stimulating activities (puzzles, reading, adult coloring books, staying active) to keep memory sharp.  Bring a copy of your living will and/or healthcare power of attorney to your next office visit.  These are the goals we discussed: Goals    . Weight (lb) < 200 lb (90.7 kg) (pt-stated)     Would like to maintain her weight just would like to get rid of the "belly"

## 2019-06-21 ENCOUNTER — Other Ambulatory Visit: Payer: Self-pay | Admitting: Family Medicine

## 2019-06-26 ENCOUNTER — Ambulatory Visit (INDEPENDENT_AMBULATORY_CARE_PROVIDER_SITE_OTHER): Payer: Medicare Other | Admitting: Family Medicine

## 2019-06-26 ENCOUNTER — Other Ambulatory Visit: Payer: Self-pay

## 2019-06-26 ENCOUNTER — Encounter: Payer: Self-pay | Admitting: Family Medicine

## 2019-06-26 VITALS — BP 132/84 | HR 61 | Ht 66.0 in | Wt 142.0 lb

## 2019-06-26 DIAGNOSIS — F419 Anxiety disorder, unspecified: Secondary | ICD-10-CM | POA: Diagnosis not present

## 2019-06-26 DIAGNOSIS — E781 Pure hyperglyceridemia: Secondary | ICD-10-CM | POA: Diagnosis not present

## 2019-06-26 DIAGNOSIS — R918 Other nonspecific abnormal finding of lung field: Secondary | ICD-10-CM | POA: Diagnosis not present

## 2019-06-26 DIAGNOSIS — J439 Emphysema, unspecified: Secondary | ICD-10-CM | POA: Diagnosis not present

## 2019-06-26 DIAGNOSIS — R911 Solitary pulmonary nodule: Secondary | ICD-10-CM | POA: Diagnosis not present

## 2019-06-26 DIAGNOSIS — G43009 Migraine without aura, not intractable, without status migrainosus: Secondary | ICD-10-CM

## 2019-06-26 DIAGNOSIS — E785 Hyperlipidemia, unspecified: Secondary | ICD-10-CM

## 2019-06-26 DIAGNOSIS — I10 Essential (primary) hypertension: Secondary | ICD-10-CM | POA: Diagnosis not present

## 2019-06-26 DIAGNOSIS — F32A Depression, unspecified: Secondary | ICD-10-CM

## 2019-06-26 DIAGNOSIS — R0781 Pleurodynia: Secondary | ICD-10-CM | POA: Diagnosis not present

## 2019-06-26 DIAGNOSIS — F329 Major depressive disorder, single episode, unspecified: Secondary | ICD-10-CM | POA: Diagnosis not present

## 2019-06-26 MED ORDER — ALBUTEROL SULFATE HFA 108 (90 BASE) MCG/ACT IN AERS: INHALATION_SPRAY | RESPIRATORY_TRACT | 3 refills | Status: AC

## 2019-06-26 MED ORDER — ESCITALOPRAM OXALATE 10 MG PO TABS: 10.0000 mg | ORAL_TABLET | Freq: Every day | ORAL | 1 refills | Status: DC

## 2019-06-26 MED ORDER — BETAMETHASONE VALERATE 0.1 % EX OINT: TOPICAL_OINTMENT | Freq: Every day | CUTANEOUS | 1 refills | Status: DC

## 2019-06-26 MED ORDER — SUMATRIPTAN SUCCINATE 100 MG PO TABS: ORAL_TABLET | ORAL | 1 refills | Status: DC

## 2019-06-26 NOTE — Progress Notes (Addendum)
Established Patient Office Visit  Subjective:  Patient ID: Christy Gutierrez, female    DOB: Feb 12, 1941  Age: 79 y.o. MRN: [id number]  CC:  Chief Complaint  Patient presents with  . Hypertension      HPI Christy Gutierrez presents for month follow-up.  Hypertension- Pt denies chest pain, SOB, dizziness, or heart palpitations.  Taking meds as directed w/o problems.  Denies medication side effects.    Follow-up anxiety/depression-currently on Lexapro.  Follow-up hyperlipidemia-not currently on a statin she is on Lovaza.  Due to repeat lipids.  Emphysema-chest x-ray from January did note emphysematous changes and fibrosis in the lungs.  No active pulmonary disease also had a approximately 7 mm pulmonary nodule in the right upper lung.  It is recommended that she have that further evaluated with CT.  She wanted to hold off until she actually felt better.  She also complains of some intermittent left upper quadrant pain most over the rib.  She says it comes and goes.  She notices it more with prolonged standing and working.  She says as soon as she sits down or either lifts both of her arms to stretch her chest out she will get relief.  It does not really bother her at other times she has not noticed any changes such as belching burping or nausea or change in stools with it.  Past Medical History:  Diagnosis Date  . Back pain     Past Surgical History:  Procedure Laterality Date  . TONSILLECTOMY    . TUBAL LIGATION     bilateral    Family History  Problem Relation Age of Onset  . Alcohol abuse Father   . Cancer Brother     Social History   Socioeconomic History  . Marital status: Widowed    Spouse name: Not on file  . Number of children: 2  . Years of education: 16  . Highest education level: Some college, no degree  Occupational History  . Occupation: [employer]    Comment: retired  Tobacco Use  . Smoking status: Never Smoker  . Smokeless tobacco: Never Used  Substance and  Sexual Activity  . Alcohol use: No  . Drug use: Never  . Sexual activity: Not Currently  Other Topics Concern  . 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 Difficulty of Paying Living Expenses:   Food Insecurity:   . Worried About Programme researcher, broadcasting/film/video in the Last Year:   . Barista in the Last Year:   Transportation Needs:   . Freight forwarder (Medical):   Marland Kitchen Lack of Transportation (Non-Medical):   Physical Activity:   . Days of Exercise per Week:   . Minutes of Exercise per Session:   Stress:   . Feeling of Stress :   Social Connections:   . Frequency of Communication with Friends and Family:   . Frequency of Social Gatherings with Friends and Family:   . Attends Religious Services:   . Active Member of Clubs or Organizations:   . Attends Banker Meetings:   Marland Kitchen Marital Status:   Intimate Partner Violence:   . Fear of Current or Ex-Partner:   . Emotionally Abused:   Marland Kitchen Physically Abused:   . Sexually Abused:     Outpatient Medications Prior to Visit  Medication Sig Dispense Refill  . Acetaminophen-Caffeine (EXCEDRIN TENSION HEADACHE) 500-65 MG TABS Take by mouth.    . Calcium Carb-Cholecalciferol [phone] MG-UNIT TABS Take by mouth.    . diclofenac sodium (VOLTAREN) 1 % GEL Apply 4 g topically 4 (four) times daily. To affected joint. 100 g 11  . fexofenadine (ALLEGRA) 180 MG tablet Take 1 tablet (180 mg total) by mouth at bedtime. 90 tablet 3  . fluticasone (FLONASE) 50 MCG/ACT nasal spray Place 1 spray into both nostrils daily. 16 g 3  . ipratropium (ATROVENT) 0.06 % nasal spray Place 2 sprays into both nostrils every 4 (four) hours as needed for rhinitis. 10 mL 6  . lisinopril (ZESTRIL) 20 MG tablet TAKE 1 TABLET DAILY 90 tablet 1  . Multiple Vitamins-Minerals (MULTIVITAMIN WITH MINERALS) tablet Take 1 tablet by  mouth daily.    Marland Kitchen omega-3 acid ethyl esters (LOVAZA) 1 g capsule TAKE 2 CAPSULES (= 2GM     TOTAL) TWO TIMES A DAY 360 capsule 3  . SPIRIVA RESPIMAT 2.5 MCG/ACT AERS USE 1 INHALATION ORALLY    DAILY 8 g 2  . albuterol (VENTOLIN HFA) 108 (90 Base) MCG/ACT inhaler TAKE 2 PUFFS BY MOUTH EVERY 6 HOURS AS NEEDED FOR WHEEZE OR SHORTNESS OF BREATH 6.7 Inhaler 2  . betamethasone valerate ointment (VALISONE) 0.1 % Apply topically daily. 45 g 1  . escitalopram (LEXAPRO) 10 MG tablet Take 1 tablet (10 mg total) by mouth daily. 90 tablet 0  . gabapentin (NEURONTIN) 100 MG capsule Take 1-3 pills before bedtime for nerve pain. 90 capsule 3  . SUMAtriptan (IMITREX) 100 MG tablet TAKE AS DIRECTED AS NEEDED 27 tablet 1   No facility-administered medications prior to visit.    Allergies  Allergen Reactions  . Amlodipine Other (See Comments)  . Clarithromycin     ROS Review of Systems    Objective:    Physical Exam  Constitutional: She is oriented to person, place, and time. She appears well-developed and well-nourished.  HENT:  Head: Normocephalic and atraumatic.  Cardiovascular: Normal rate, regular rhythm and normal heart sounds.  Pulmonary/Chest: Effort normal and breath sounds normal.  Neurological: She is alert and oriented to person, place, and time.  Skin: Skin is warm and dry.  Psychiatric: She has a normal mood and affect. Her behavior is normal.    BP 132/84   Pulse 61   Ht 5\' 6"  (1.676 m)   Wt 142 lb (64.4 kg)   SpO2 98%   BMI 22.92 kg/m  Wt Readings from Last 3 Encounters:  06/26/19 142 lb (64.4 kg)  05/01/19 143 lb (64.9 kg)  12/26/18 146 lb (66.2 kg)     There are no preventive care reminders to display for this patient.  There are no preventive care reminders to display for this patient.  Lab Results  Component Value Date   TSH 2.04 04/21/2017   Lab Results  Component Value Date   WBC 5.0 09/07/2010   HGB 13.9 09/07/2010   HCT 42.1 09/07/2010   MCV 92.9  09/07/2010   PLT 145 (L) 09/07/2010   Lab Results  Component Value Date   NA 140 12/26/2018   K 4.4 12/26/2018   CO2 28 12/26/2018   GLUCOSE 96 12/26/2018   BUN 16 12/26/2018   CREATININE 0.77 12/26/2018   BILITOT 0.6 04/21/2017   ALKPHOS 55 11/21/2014   AST 22 04/21/2017   ALT 17 04/21/2017   PROT 6.7 04/21/2017   ALBUMIN 4.2 11/21/2014  CALCIUM 9.6 12/26/2018   Lab Results  Component Value Date   CHOL 219 (H) 04/21/2017   Lab Results  Component Value Date   HDL 86 04/21/2017   Lab Results  Component Value Date   LDLCALC 119 (H) 04/21/2017   Lab Results  Component Value Date   TRIG 45 04/21/2017   Lab Results  Component Value Date   CHOLHDL 2.5 04/21/2017   No results found for: HGBA1C    Assessment & Plan:   Problem List Items Addressed This Visit      Cardiovascular and Mediastinum   Migraine    Overall doing well she does need a refill on her Imitrex.  She also sometimes use over-the-counter medications.      Relevant Medications   SUMAtriptan (IMITREX) 100 MG tablet   escitalopram (LEXAPRO) 10 MG tablet   Essential hypertension - Primary    Blood pressure looks great today on current regimen.  Due for updated lab work.  Otherwise follow-up in 6 months.      Relevant Orders   Lipid Panel w/reflex Direct LDL   COMPLETE METABOLIC PANEL WITH GFR     Respiratory   Emphysema lung (HCC)    Is actually a fairly new diagnosis is based on her chest x-ray from January.  She says occasionally she will notice a little bit of shortness of breath with certain activities but it is not bothersome and she really does not want to do anything proactive for this.  She says she is really able to do most of the things that she would like to and it does not interfere with her quality of life.      Relevant Medications   albuterol (VENTOLIN HFA) 108 (90 Base) MCG/ACT inhaler   Other Relevant Orders   CT Chest Wo Contrast     Other   Pulmonary nodule, right     Had a long discussion today about the pulmonary nodule seen in the right upper lung on the chest x-ray at 7 mm.  She says that if it is something such as lung cancer etc. she does not want any type of intervention or treatment.  She believes that 78 her quality of life is fantastic and that she is left her life and on all the things that she would like to do and is fine if there is something that could be life-threatening.  She puts a lot of faith in God and her prayer.  She is okay with at least moving forward to evaluate the nodule but says she would not want to do anything proactive about it.      Hyperlipidemia    Due to recheck fasting lipids.  Again just on Lovaza.      Relevant Orders   Lipid Panel w/reflex Direct LDL   COMPLETE METABOLIC PANEL WITH GFR   Anxiety and depression    Like overall she is doing okay in regards to mood.  We will continue with Lexapro for now.  See PHQ-9 and GAD-7 score.  She really truly has a very positive outlook.  She would like to continue the medication.      Relevant Medications   escitalopram (LEXAPRO) 10 MG tablet    Other Visit Diagnoses    Hypertriglyceridemia       Abnormal findings on diagnostic imaging of lung       Relevant Orders   CT Chest Wo Contrast   Rib pain on left side  Left rib pain-based on the location and a description of her pain I really think this is related to her ribs and when she is very active it gets inflamed and then when she sits and rest or stretches she gets relief.  It does not sound like it is GI related.  That we could certainly work it up further if she would like.  For now she wants to hold off.  Meds ordered this encounter  Medications  . albuterol (VENTOLIN HFA) 108 (90 Base) MCG/ACT inhaler    Sig: TAKE 2 PUFFS BY MOUTH EVERY 6 HOURS AS NEEDED FOR WHEEZE OR SHORTNESS OF BREATH    Dispense:  18 g    Refill:  3  . betamethasone valerate ointment (VALISONE) 0.1 %    Sig: Apply topically daily.     Dispense:  45 g    Refill:  1  . SUMAtriptan (IMITREX) 100 MG tablet    Sig: TAKE AS DIRECTED AS NEEDED    Dispense:  27 tablet    Refill:  1  . DISCONTD: escitalopram (LEXAPRO) 10 MG tablet    Sig: Take 1 tablet (10 mg total) by mouth daily.    Dispense:  90 tablet    Refill:  1    Ok to fill 09/18/2019  . escitalopram (LEXAPRO) 10 MG tablet    Sig: Take 1 tablet (10 mg total) by mouth daily.    Dispense:  90 tablet    Refill:  1    Ok to fill 09/18/2019    Follow-up: Return in about 6 months (around 12/26/2019) for Hypertension.    Nani Gasser, MD

## 2019-06-26 NOTE — Assessment & Plan Note (Signed)
Blood pressure looks great today on current regimen.  Due for updated lab work.  Otherwise follow-up in 6 months.

## 2019-06-26 NOTE — Assessment & Plan Note (Signed)
Due to recheck fasting lipids.  Again just on Lovaza.

## 2019-06-26 NOTE — Assessment & Plan Note (Signed)
Is actually a fairly new diagnosis is based on her chest x-ray from January.  She says occasionally she will notice a little bit of shortness of breath with certain activities but it is not bothersome and she really does not want to do anything proactive for this.  She says she is really able to do most of the things that she would like to and it does not interfere with her quality of life.

## 2019-06-26 NOTE — Assessment & Plan Note (Signed)
Like overall she is doing okay in regards to mood.  We will continue with Lexapro for now.  See PHQ-9 and GAD-7 score.  She really truly has a very positive outlook.  She would like to continue the medication.

## 2019-06-26 NOTE — Assessment & Plan Note (Signed)
Overall doing well she does need a refill on her Imitrex.  She also sometimes use over-the-counter medications.

## 2019-06-26 NOTE — Assessment & Plan Note (Signed)
Had a long discussion today about the pulmonary nodule seen in the right upper lung on the chest x-ray at 7 mm.  She says that if it is something such as lung cancer etc. she does not want any type of intervention or treatment.  She believes that 78 her quality of life is fantastic and that she is left her life and on all the things that she would like to do and is fine if there is something that could be life-threatening.  She puts a lot of faith in God and her prayer.  She is okay with at least moving forward to evaluate the nodule but says she would not want to do anything proactive about it.

## 2019-06-28 DIAGNOSIS — I10 Essential (primary) hypertension: Secondary | ICD-10-CM | POA: Diagnosis not present

## 2019-06-28 DIAGNOSIS — E785 Hyperlipidemia, unspecified: Secondary | ICD-10-CM | POA: Diagnosis not present

## 2019-06-28 LAB — COMPLETE METABOLIC PANEL WITH GFR
AG Ratio: 2.3 (calc) (ref 1.0–2.5)
ALT: 25 U/L (ref 6–29)
AST: 31 U/L (ref 10–35)
Albumin: 4.3 g/dL (ref 3.6–5.1)
Alkaline phosphatase (APISO): 59 U/L (ref 37–153)
BUN: 15 mg/dL (ref 7–25)
CO2: 26 mmol/L (ref 20–32)
Calcium: 8.9 mg/dL (ref 8.6–10.4)
Chloride: 106 mmol/L (ref 98–110)
Creat: 0.68 mg/dL (ref 0.60–0.88)
GFR, Est African American: 96 mL/min/{1.73_m2} (ref >=60)
GFR, Est Non African American: 83 mL/min/{1.73_m2} (ref >=60)
Globulin: 1.9 g/dL (calc) (ref 1.9–3.7)
Glucose, Bld: 92 mg/dL (ref 65–99)
Potassium: 4.1 mmol/L (ref 3.5–5.3)
Sodium: 141 mmol/L (ref 135–146)
Total Bilirubin: 0.6 mg/dL (ref 0.2–1.2)
Total Protein: 6.2 g/dL (ref 6.1–8.1)

## 2019-06-28 LAB — LIPID PANEL W/REFLEX DIRECT LDL
Cholesterol: 220 mg/dL — ABNORMAL HIGH (ref <200)
HDL: 75 mg/dL (ref >=50)
LDL Cholesterol (Calc): 130 mg/dL (calc) — ABNORMAL HIGH
Non-HDL Cholesterol (Calc): 145 mg/dL (calc) — ABNORMAL HIGH (ref <130)
Total CHOL/HDL Ratio: 2.9 (calc) (ref <5.0)
Triglycerides: 49 mg/dL (ref <150)

## 2019-07-04 ENCOUNTER — Other Ambulatory Visit: Payer: Self-pay

## 2019-07-04 ENCOUNTER — Ambulatory Visit (INDEPENDENT_AMBULATORY_CARE_PROVIDER_SITE_OTHER): Payer: Medicare Other

## 2019-07-04 DIAGNOSIS — J439 Emphysema, unspecified: Secondary | ICD-10-CM | POA: Diagnosis not present

## 2019-07-04 DIAGNOSIS — R918 Other nonspecific abnormal finding of lung field: Secondary | ICD-10-CM

## 2019-08-16 ENCOUNTER — Other Ambulatory Visit: Payer: Self-pay | Admitting: Family Medicine

## 2019-08-16 DIAGNOSIS — I10 Essential (primary) hypertension: Secondary | ICD-10-CM

## 2019-11-14 ENCOUNTER — Other Ambulatory Visit: Payer: Self-pay | Admitting: Family Medicine

## 2020-02-10 ENCOUNTER — Other Ambulatory Visit: Payer: Self-pay | Admitting: Family Medicine

## 2020-02-10 DIAGNOSIS — I10 Essential (primary) hypertension: Secondary | ICD-10-CM

## 2020-02-26 DIAGNOSIS — H5203 Hypermetropia, bilateral: Secondary | ICD-10-CM | POA: Diagnosis not present

## 2020-02-26 DIAGNOSIS — H25813 Combined forms of age-related cataract, bilateral: Secondary | ICD-10-CM | POA: Diagnosis not present

## 2020-02-26 DIAGNOSIS — H524 Presbyopia: Secondary | ICD-10-CM | POA: Diagnosis not present

## 2020-03-21 ENCOUNTER — Encounter: Payer: Self-pay | Admitting: Physician Assistant

## 2020-03-21 ENCOUNTER — Other Ambulatory Visit: Payer: Self-pay

## 2020-03-21 ENCOUNTER — Ambulatory Visit (INDEPENDENT_AMBULATORY_CARE_PROVIDER_SITE_OTHER): Payer: Medicare Other | Admitting: Physician Assistant

## 2020-03-21 VITALS — BP 126/73 | HR 76 | Temp 98.0°F | Ht 66.0 in | Wt 147.0 lb

## 2020-03-21 DIAGNOSIS — J0140 Acute pansinusitis, unspecified: Secondary | ICD-10-CM

## 2020-03-21 DIAGNOSIS — Z20822 Contact with and (suspected) exposure to covid-19: Secondary | ICD-10-CM

## 2020-03-21 MED ORDER — AMOXICILLIN-POT CLAVULANATE 875-125 MG PO TABS: 1.0000 | ORAL_TABLET | Freq: Two times a day (BID) | ORAL | 0 refills | Status: DC

## 2020-03-21 NOTE — Patient Instructions (Signed)

## 2020-03-21 NOTE — Progress Notes (Signed)
   Subjective:    Patient ID: Christy Gutierrez, female    DOB: 12-17-1940, 80 y.o.   MRN: [id number]  HPI  Patient is a 80 year old female with hypertension and emphysema who presents to the clinic with 3 weeks of covid symptoms that have mostly resolved except for her head congestion and sinus pressure.  Symptoms started around the first of the year 03/01/2020.  Multiple people in her household tested positive for Covid but she never took a test.  She had nausea, diarrhea, chills, head congestion, loss of smell and taste, fatigue. Now her sinus post nasal drip, head congestion and sinus pressure will not resolve. She has taken mucinex, tylenol, ibuprofen which helps but not resolving. No SOB, cough or problems breathing.   Pt is not vaccinated.   .. Active Ambulatory Problems    Diagnosis Date Noted  . UNSPECIFIED VITAMIN D DEFICIENCY 11/04/2009  . Hyperlipidemia 11/04/2009  . BACK PAIN, CHRONIC 11/04/2009  . Osteoporosis 11/10/2009  . FATIGUE 11/04/2009  . POSTMENOPAUSAL STATUS 11/04/2009  . Migraine 05/08/2013  . 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  . Left knee pain 04/02/2015  . Essential hypertension 03/05/2016  . Episodic lightheadedness 08/08/2017  . Emphysema lung (HCC) 03/28/2018  . Anxiety and depression 12/26/2018  . Pulmonary nodule, right 06/26/2019   Resolved Ambulatory Problems    Diagnosis Date Noted  . WRIST PAIN, LEFT 11/10/2009  . WRIST SPRAIN, LEFT 11/10/2009  . ELEVATED BLOOD PRESSURE WITHOUT DIAGNOSIS OF HYPERTENSION 03/10/2010  . Dysfunction of right eustachian tube 02/28/2015   Past Medical History:  Diagnosis Date  . Back pain       Review of Systems See HPI.     Objective:   Physical Exam Vitals reviewed.  Constitutional:      Appearance: Normal appearance.  HENT:     Head: Normocephalic.     Comments: Maxillary and frontal sinus tenderness to palpation.     Right Ear: Tympanic membrane normal.     Left Ear: Tympanic membrane normal.     Nose:  Congestion present.     Mouth/Throat:     Mouth: Mucous membranes are moist.  Eyes:     Conjunctiva/sclera: Conjunctivae normal.  Cardiovascular:     Rate and Rhythm: Normal rate and regular rhythm.     Pulses: Normal pulses.  Pulmonary:     Effort: Pulmonary effort is normal.     Breath sounds: Normal breath sounds.  Musculoskeletal:     Right lower leg: No edema.     Left lower leg: No edema.  Neurological:     General: No focal deficit present.     Mental Status: She is alert and oriented to person, place, and time.           Assessment & Plan:  Christy Gutierrez KitchenMarland KitchenArieona was seen today for nasal congestion.  Diagnoses and all orders for this visit:  Acute non-recurrent pansinusitis -     amoxicillin-clavulanate (AUGMENTIN) 875-125 MG tablet; Take 1 tablet by mouth 2 (two) times daily.  Suspected COVID-19 virus infection   Due to her close Covid exposures and typical Covid symptoms likely had Covid infection.  She is out of the quarantine range.  She has residual sinusitis symptoms.  With a 3-week duration will treat with Augmentin for 10 days.  Follow-up as needed or if symptoms worsen or change.  Consider adding Flonase to help with sinusitis symptoms.

## 2020-03-21 NOTE — Progress Notes (Signed)
Started 03/01/2020 Nausea and diarrhea Chills and head congestion - never moved to chest Loss of taste/appetite Was around daughter with Covid Never tested for Covid Not vaccinated

## 2020-03-24 ENCOUNTER — Encounter: Payer: Self-pay | Admitting: Physician Assistant

## 2020-04-15 ENCOUNTER — Other Ambulatory Visit: Payer: Self-pay | Admitting: Family Medicine

## 2020-04-15 DIAGNOSIS — E781 Pure hyperglyceridemia: Secondary | ICD-10-CM

## 2020-08-06 ENCOUNTER — Other Ambulatory Visit: Payer: Self-pay | Admitting: Family Medicine

## 2020-08-06 DIAGNOSIS — I10 Essential (primary) hypertension: Secondary | ICD-10-CM

## 2020-08-07 NOTE — Telephone Encounter (Signed)
Patient scheduled for follow up.

## 2020-09-03 ENCOUNTER — Ambulatory Visit (INDEPENDENT_AMBULATORY_CARE_PROVIDER_SITE_OTHER): Payer: Medicare Other | Admitting: Family Medicine

## 2020-09-03 ENCOUNTER — Other Ambulatory Visit: Payer: Self-pay

## 2020-09-03 ENCOUNTER — Encounter: Payer: Self-pay | Admitting: Family Medicine

## 2020-09-03 VITALS — BP 110/52 | HR 61 | Ht 66.0 in | Wt 146.0 lb

## 2020-09-03 DIAGNOSIS — R55 Syncope and collapse: Secondary | ICD-10-CM | POA: Diagnosis not present

## 2020-09-03 DIAGNOSIS — F419 Anxiety disorder, unspecified: Secondary | ICD-10-CM

## 2020-09-03 DIAGNOSIS — I10 Essential (primary) hypertension: Secondary | ICD-10-CM | POA: Diagnosis not present

## 2020-09-03 DIAGNOSIS — F32A Depression, unspecified: Secondary | ICD-10-CM

## 2020-09-03 DIAGNOSIS — D696 Thrombocytopenia, unspecified: Secondary | ICD-10-CM

## 2020-09-03 NOTE — Assessment & Plan Note (Signed)
Stable on her lexapro. Discussed getting out and making social connections. She has joined her old church and feel that helps.  Her son and daughter check on her daily.

## 2020-09-03 NOTE — Progress Notes (Signed)
Established Patient Office Visit  Subjective:  Patient ID: Christy Gutierrez, female    DOB: [birth year]  Age: 80 y.o. MRN: [id number]  CC:  Chief Complaint  Patient presents with   Hypertension    HPI Christy Gutierrez presents for  6 mo f/u.   Hypertension- Pt denies chest pain, SOB, or heart palpitations.  Taking meds as directed w/o problems.  Denies medication side effects.  Does check Home BPs - occ as low as 75/50 and occ gets dizziness and lightheaded.  Feels alike about 1-2 / month gets a presyncopal event for about the last year. Says it only last for seconds and has never passed out.   F/U Anxiety and depression  -overall doing oK.  Her son and daughter check on her daily but [place] Jock but she really does not have significant social network for support.  She relies very heavily on her faith and prayer. She feels like she is still really greiving for her husband who now passed 14 yr ago.    She also says her eyes feel irritated as well.  She reads a lot and wonders if that is causing some irritation.  Her next eye appointment has not until December.  Past Medical History:  Diagnosis Date   Back pain     Past Surgical History:  Procedure Laterality Date   TONSILLECTOMY     TUBAL LIGATION     bilateral    Family History  Problem Relation Age of Onset   Alcohol abuse Father    Cancer Brother     Social History   Socioeconomic History   Marital status: Widowed    Spouse name: Not on file   Number of children: 2   Years of education: 17   Highest education level: Some college, no degree  Occupational History   Occupation: Human resources officer    Comment: retired  Tobacco Use   Smoking status: Never   Smokeless tobacco: Never  Vaping Use   Vaping Use: Never used  Substance and Sexual Activity   Alcohol use: No   Drug use: Never   Sexual activity: Not Currently  Other Topics Concern   Not on file  Social History Narrative   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     albuterol (VENTOLIN HFA) 108 (90 Base) MCG/ACT inhaler TAKE 2 PUFFS BY MOUTH EVERY 6 HOURS AS NEEDED FOR WHEEZE OR SHORTNESS OF BREATH 18 g 3   betamethasone valerate ointment (VALISONE) 0.1 % APPLY TOPICALLY DAILY 45 g 1   Calcium Carb-Cholecalciferol [phone] MG-UNIT TABS Take by mouth.     diclofenac sodium (VOLTAREN) 1 % GEL Apply 4 g topically 4 (four) times daily. To affected joint. 100 g 11   escitalopram (LEXAPRO) 10 MG tablet Take 1 tablet (10 mg total) by mouth daily. Needs appointment. 90 tablet 0   fexofenadine (ALLEGRA) 180 MG tablet Take 1 tablet (180 mg total) by mouth at bedtime. 90 tablet 3   fluticasone (FLONASE) 50 MCG/ACT nasal spray Place 1 spray  into both nostrils daily. 16 g 3   lisinopril (ZESTRIL) 20 MG tablet Take 1 tablet (20 mg total) by mouth daily. Needs appointment. 90 tablet 0   Multiple Vitamins-Minerals (MULTIVITAMIN WITH MINERALS) tablet Take 1 tablet by mouth daily.     omega-3 acid ethyl esters (LOVAZA) 1 g capsule TAKE 2 CAPSULES TWICE DAILY 360 capsule 3   SPIRIVA RESPIMAT 2.5 MCG/ACT AERS USE 1 INHALATION ORALLY    DAILY 8 g 2   SUMAtriptan (IMITREX) 100 MG tablet TAKE AS DIRECTED AS NEEDED 27 tablet 1   amoxicillin-clavulanate (AUGMENTIN) 875-125 MG tablet Take 1 tablet by mouth 2 (two) times daily. 20 tablet 0   ipratropium (ATROVENT) 0.06 % nasal spray Place 2 sprays into both nostrils every 4 (four) hours as needed for rhinitis. 10 mL 6   No facility-administered medications prior to visit.    Allergies  Allergen Reactions    Amlodipine Other (See Comments)   Clarithromycin     ROS Review of Systems    Objective:    Physical Exam Constitutional:      Appearance: Normal appearance. She is well-developed.  HENT:     Head: Normocephalic and atraumatic.  Cardiovascular:     Rate and Rhythm: Normal rate and regular rhythm.     Heart sounds: Normal heart sounds.  Pulmonary:     Effort: Pulmonary effort is normal.     Breath sounds: Normal breath sounds.  Skin:    General: Skin is warm and dry.  Neurological:     Mental Status: She is alert and oriented to person, place, and time.  Psychiatric:        Behavior: Behavior normal.    BP (!) 110/52   Pulse 61   Ht 5\' 6"  (1.676 m)   Wt 146 lb (66.2 kg)   SpO2 98%   BMI 23.57 kg/m  Wt Readings from Last 3 Encounters:  09/03/20 146 lb (66.2 kg)  03/21/20 147 lb (66.7 kg)  06/26/19 142 lb (64.4 kg)     Health Maintenance Due  Topic Date Due   COVID-19 Vaccine (1) Never done   Zoster Vaccines- Shingrix (1 of 2) Never done   PNA vac Low Risk Adult (1 of 2 - PCV13) Never done    There are no preventive care reminders to display for this patient.  Lab Results  Component Value Date   TSH 2.04 04/21/2017   Lab Results  Component Value Date   WBC 5.0 09/07/2010   HGB 13.9 09/07/2010   HCT 42.1 09/07/2010   MCV 92.9 09/07/2010   PLT 145 (L) 09/07/2010   Lab Results  Component Value Date   NA 141 06/28/2019   K 4.1 06/28/2019   CO2 26 06/28/2019   GLUCOSE 92 06/28/2019   BUN 15 06/28/2019   CREATININE 0.68 06/28/2019   BILITOT 0.6 06/28/2019   ALKPHOS 55 11/21/2014   AST 31 06/28/2019   ALT 25 06/28/2019   PROT 6.2 06/28/2019   ALBUMIN 4.2 11/21/2014   CALCIUM 8.9 06/28/2019   Lab Results  Component Value Date   CHOL 220 (H) 06/28/2019   Lab Results  Component Value Date   HDL 75 06/28/2019   Lab Results  Component Value Date   LDLCALC 130 (H) 06/28/2019   Lab Results  Component Value Date   TRIG 49 06/28/2019   Lab  Results  Component Value Date   CHOLHDL 2.9 06/28/2019   No results found for: HGBA1C    Assessment &  Plan:   Problem List Items Addressed This Visit       Cardiovascular and Mediastinum   Near syncope    Will address low BP and see if improves. If not then will need further workup       Essential hypertension - Primary    Increase hydration.    Cut your lisinopril in half and take half a tab daily.  After 2 weeks if still having BP less than 115 then call me back and we can change you to the 5mg  dose.        Relevant Orders   COMPLETE METABOLIC PANEL WITH GFR   Lipid panel   CBC     Other   Anxiety and depression    Stable on her lexapro. Discussed getting out and making social connections. She has joined her old church and feel that helps.  Her son and daughter check on her daily.       Other Visit Diagnoses     Thrombocytopenia (HCC)       Relevant Orders   CBC       For eye irritation recommend an over-the-counter eye lubricant moisturizing drops.  No orders of the defined types were placed in this encounter.   Follow-up: Return in about 4 weeks (around 10/01/2020) for Hypertension - nurse visit for recheck BP.    12/01/2020, MD

## 2020-09-03 NOTE — Assessment & Plan Note (Signed)
Increase hydration.    Cut your lisinopril in half and take half a tab daily.  After 2 weeks if still having BP less than 115 then call me back and we can change you to the 5mg  dose.

## 2020-09-03 NOTE — Assessment & Plan Note (Signed)
Will address low BP and see if improves. If not then will need further workup

## 2020-09-03 NOTE — Patient Instructions (Signed)
Cut your lisinopril in half and take half a tab daily.  After 2 weeks if still having BP less than 115 then call me back and we can change you to the 5mg  dose.

## 2020-09-04 DIAGNOSIS — D696 Thrombocytopenia, unspecified: Secondary | ICD-10-CM | POA: Diagnosis not present

## 2020-09-04 DIAGNOSIS — I10 Essential (primary) hypertension: Secondary | ICD-10-CM | POA: Diagnosis not present

## 2020-09-04 LAB — CBC
HCT: 40.6 % (ref 35.0–45.0)
Hemoglobin: 13.6 g/dL (ref 11.7–15.5)
MCH: 30.7 pg (ref 27.0–33.0)
MCHC: 33.5 g/dL (ref 32.0–36.0)
MCV: 91.6 fL (ref 80.0–100.0)
MPV: 11.2 fL (ref 7.5–12.5)
Platelets: 161 10*3/uL (ref 140–400)
RBC: 4.43 10*6/uL (ref 3.80–5.10)
RDW: 12.7 % (ref 11.0–15.0)
WBC: 4.8 10*3/uL (ref 3.8–10.8)

## 2020-09-04 LAB — LIPID PANEL
Cholesterol: 220 mg/dL — ABNORMAL HIGH (ref <200)
HDL: 83 mg/dL (ref >=50)
LDL Cholesterol (Calc): 123 mg/dL (calc) — ABNORMAL HIGH
Non-HDL Cholesterol (Calc): 137 mg/dL (calc) — ABNORMAL HIGH (ref <130)
Total CHOL/HDL Ratio: 2.7 (calc) (ref <5.0)
Triglycerides: 52 mg/dL (ref <150)

## 2020-09-04 LAB — COMPLETE METABOLIC PANEL WITH GFR
AG Ratio: 2.1 (calc) (ref 1.0–2.5)
ALT: 19 U/L (ref 6–29)
AST: 26 U/L (ref 10–35)
Albumin: 4.5 g/dL (ref 3.6–5.1)
Alkaline phosphatase (APISO): 55 U/L (ref 37–153)
BUN: 21 mg/dL (ref 7–25)
CO2: 27 mmol/L (ref 20–32)
Calcium: 9 mg/dL (ref 8.6–10.4)
Chloride: 106 mmol/L (ref 98–110)
Creat: 0.71 mg/dL (ref 0.60–0.88)
GFR, Est African American: 93 mL/min/{1.73_m2} (ref >=60)
GFR, Est Non African American: 80 mL/min/{1.73_m2} (ref >=60)
Globulin: 2.1 g/dL (calc) (ref 1.9–3.7)
Glucose, Bld: 92 mg/dL (ref 65–99)
Potassium: 4.3 mmol/L (ref 3.5–5.3)
Sodium: 140 mmol/L (ref 135–146)
Total Bilirubin: 0.7 mg/dL (ref 0.2–1.2)
Total Protein: 6.6 g/dL (ref 6.1–8.1)

## 2020-09-15 ENCOUNTER — Telehealth: Payer: Self-pay

## 2020-09-15 NOTE — Telephone Encounter (Signed)
Christy Gutierrez called and stated that her BP has been low recently, today it was 93/69 and she states she is feeling dizzy at times. Pt wants to know if she should reduce her lisinopril to 5mg .  Please advise, thanks.

## 2020-09-16 MED ORDER — LISINOPRIL 5 MG PO TABS
5.0000 mg | ORAL_TABLET | Freq: Every day | ORAL | 0 refills | Status: DC
Start: 2020-09-16 — End: 2020-10-07

## 2020-09-16 NOTE — Telephone Encounter (Signed)
Pt.notified

## 2020-09-16 NOTE — Telephone Encounter (Signed)
Yes, please try 5 mg dose.  New prescription sent to local pharmacy so that we can try it for 2 to 3 weeks and see if that seems to be would be working really well.

## 2020-10-01 ENCOUNTER — Other Ambulatory Visit: Payer: Self-pay

## 2020-10-01 ENCOUNTER — Ambulatory Visit (INDEPENDENT_AMBULATORY_CARE_PROVIDER_SITE_OTHER): Payer: Medicare Other | Admitting: Family Medicine

## 2020-10-01 VITALS — BP 123/65 | HR 63

## 2020-10-01 DIAGNOSIS — I10 Essential (primary) hypertension: Secondary | ICD-10-CM | POA: Diagnosis not present

## 2020-10-01 NOTE — Progress Notes (Signed)
Established Patient Office Visit  Subjective:  Patient ID: Christy Gutierrez, female    DOB: 06/17/1940  Age: 80 y.o. MRN: [id number]  CC:  Chief Complaint  Patient presents with   Hypertension    HPI Christy Gutierrez presents for blood pressure check.  Home blood pressure readings are as follows:   09/05/2020-102/88 09/06/2020-119/73 09/08/2020-136/81 09/09/2020-115/71 09/10/2020-118/73 09/12/2020-115/69 09/13/2020-105/65 09/14/2020-106/68 09/15/2020-93/69 09/16/2020-98/55 7/20/2022106/63 09/18/2020-115/73 09/19/2020-108/68 09/20/2020-115/72 09/21/2020-125/73 09/22/2020-115/72 09/23/2020-115/66 09/24/2020-105/68 09/25/2020-118/72 09/26/2020-111/68 09/27/2020-112/67 09/28/2020-124/74 09/29/2020-101/68 09/30/2020-128/80  Past Medical History:  Diagnosis Date   Back pain     Past Surgical History:  Procedure Laterality Date   TONSILLECTOMY     TUBAL LIGATION     bilateral    Family History  Problem Relation Age of Onset   Alcohol abuse Father    Cancer Brother     Social History   Socioeconomic History   Marital status: Widowed    Spouse name: Not on file   Number of children: 2   Years of education: 26   Highest education level: Some college, no degree  Occupational History   Occupation: Human resources officer    Comment: retired  Tobacco Use   Smoking status: Never   Smokeless tobacco: Never  Vaping Use   Vaping Use: Never used  Substance and Sexual Activity   Alcohol use: No   Drug use: Never   Sexual activity: Not Currently  Other Topics Concern   Not on file  Social History Narrative   Works on the farm still.   Drives tractors to bale hay   Very active   Social Determinants of Health   Financial Resource Strain: Not on file  Food Insecurity: Not on file  Transportation Needs: Not on file  Physical Activity: Not on file  Stress: Not on file  Social Connections: Not on file  Intimate Partner Violence: Not on file    Outpatient Medications Prior to Visit  Medication  Sig Dispense Refill   Acetaminophen-Caffeine 500-65 MG TABS Take by mouth.     albuterol (VENTOLIN HFA) 108 (90 Base) MCG/ACT inhaler TAKE 2 PUFFS BY MOUTH EVERY 6 HOURS AS NEEDED FOR WHEEZE OR SHORTNESS OF BREATH 18 g 3   betamethasone valerate ointment (VALISONE) 0.1 % APPLY TOPICALLY DAILY 45 g 1   Calcium Carb-Cholecalciferol [phone] MG-UNIT TABS Take by mouth.     diclofenac sodium (VOLTAREN) 1 % GEL Apply 4 g topically 4 (four) times daily. To affected joint. 100 g 11   escitalopram (LEXAPRO) 10 MG tablet Take 1 tablet (10 mg total) by mouth daily. Needs appointment. 90 tablet 0   fexofenadine (ALLEGRA) 180 MG tablet Take 1 tablet (180 mg total) by mouth at bedtime. 90 tablet 3   fluticasone (FLONASE) 50 MCG/ACT nasal spray Place 1 spray into both nostrils daily. 16 g 3   lisinopril (ZESTRIL) 5 MG tablet Take 1 tablet (5 mg total) by mouth daily. 30 tablet 0   Multiple Vitamins-Minerals (MULTIVITAMIN WITH MINERALS) tablet Take 1 tablet by mouth daily.     omega-3 acid ethyl esters (LOVAZA) 1 g capsule TAKE 2 CAPSULES TWICE DAILY 360 capsule 3   SPIRIVA RESPIMAT 2.5 MCG/ACT AERS USE 1 INHALATION ORALLY    DAILY 8 g 2   SUMAtriptan (IMITREX) 100 MG tablet TAKE AS DIRECTED AS NEEDED 27 tablet 1   No facility-administered medications prior to visit.    Allergies  Allergen Reactions   Amlodipine Other (See Comments)   Clarithromycin     ROS Review of Systems  Objective:    Physical Exam  BP 123/65 (BP Location: Left Arm, Patient Position: Sitting, Cuff Size: Normal)   Pulse 63   SpO2 99%  Wt Readings from Last 3 Encounters:  09/03/20 146 lb (66.2 kg)  03/21/20 147 lb (66.7 kg)  06/26/19 142 lb (64.4 kg)     Health Maintenance Due  Topic Date Due   COVID-19 Vaccine (1) Never done   Zoster Vaccines- Shingrix (1 of 2) Never done   PNA vac Low Risk Adult (1 of 2 - PCV13) Never done   INFLUENZA VACCINE  09/29/2020    There are no preventive care reminders to display  for this patient.  Lab Results  Component Value Date   TSH 2.04 04/21/2017   Lab Results  Component Value Date   WBC 4.8 09/04/2020   HGB 13.6 09/04/2020   HCT 40.6 09/04/2020   MCV 91.6 09/04/2020   PLT 161 09/04/2020   Lab Results  Component Value Date   NA 140 09/04/2020   K 4.3 09/04/2020   CO2 27 09/04/2020   GLUCOSE 92 09/04/2020   BUN 21 09/04/2020   CREATININE 0.71 09/04/2020   BILITOT 0.7 09/04/2020   ALKPHOS 55 11/21/2014   AST 26 09/04/2020   ALT 19 09/04/2020   PROT 6.6 09/04/2020   ALBUMIN 4.2 11/21/2014   CALCIUM 9.0 09/04/2020   Lab Results  Component Value Date   CHOL 220 (H) 09/04/2020   Lab Results  Component Value Date   HDL 83 09/04/2020   Lab Results  Component Value Date   LDLCALC 123 (H) 09/04/2020   Lab Results  Component Value Date   TRIG 52 09/04/2020   Lab Results  Component Value Date   CHOLHDL 2.7 09/04/2020   No results found for: HGBA1C    Assessment & Plan:   Patient blood pressure is within normal range today. She will continue to monitor it at home and follow up in 3 months with provider.  Problem List Items Addressed This Visit   None   No orders of the defined types were placed in this encounter.   Follow-up: Return in about 3 months (around 01/01/2021).    Christy Gutierrez, CMA

## 2020-10-01 NOTE — Progress Notes (Signed)
HTN- we had recently dec her dose bc of low BP. Well controlled. Continue current regimen. Keep regular appt

## 2020-10-02 ENCOUNTER — Other Ambulatory Visit: Payer: Self-pay | Admitting: Family Medicine

## 2020-12-16 ENCOUNTER — Other Ambulatory Visit: Payer: Self-pay | Admitting: Family Medicine

## 2021-01-01 ENCOUNTER — Encounter: Payer: Self-pay | Admitting: Family Medicine

## 2021-01-01 ENCOUNTER — Other Ambulatory Visit: Payer: Self-pay

## 2021-01-01 ENCOUNTER — Ambulatory Visit (INDEPENDENT_AMBULATORY_CARE_PROVIDER_SITE_OTHER): Payer: Medicare Other | Admitting: Family Medicine

## 2021-01-01 VITALS — BP 123/62 | HR 58 | Ht 66.0 in | Wt 148.0 lb

## 2021-01-01 DIAGNOSIS — F32A Depression, unspecified: Secondary | ICD-10-CM | POA: Diagnosis not present

## 2021-01-01 DIAGNOSIS — F419 Anxiety disorder, unspecified: Secondary | ICD-10-CM | POA: Diagnosis not present

## 2021-01-01 DIAGNOSIS — Z23 Encounter for immunization: Secondary | ICD-10-CM

## 2021-01-01 DIAGNOSIS — I10 Essential (primary) hypertension: Secondary | ICD-10-CM | POA: Diagnosis not present

## 2021-01-01 MED ORDER — LISINOPRIL 5 MG PO TABS: 5.0000 mg | ORAL_TABLET | Freq: Every day | ORAL | 1 refills | Status: DC

## 2021-01-01 MED ORDER — ESCITALOPRAM OXALATE 20 MG PO TABS: 20.0000 mg | ORAL_TABLET | Freq: Every day | ORAL | 0 refills | Status: DC

## 2021-01-01 NOTE — Patient Instructions (Addendum)
Please call back sooner if you feel like we may to make an adjustment to your Lexapro again.

## 2021-01-01 NOTE — Assessment & Plan Note (Signed)
Discussed options.  We will try going up on the Lexapro to see if it makes a difference we also discussed possibly even just switching medications in general.  She just feels like she is let a very full life and is okay with not being here at some point.  Though she denies any suicidal thoughts.

## 2021-01-01 NOTE — Progress Notes (Signed)
Established Patient Office Visit  Subjective:  Patient ID: Christy Gutierrez, female    DOB: [birth year]  Age: 80 y.o. MRN: [id number]  CC:  Chief Complaint  Patient presents with   Hypertension    HPI Christy Gutierrez presents for 3 month follow-up.  Hypertension- Pt denies chest pain, SOB, dizziness, or heart palpitations.  Taking meds as directed w/o problems.  Denies medication side effects.  Taking a 5 mg since 09/17/2020. She had near syncope and hypotension back in July and we had cut her pill in half.  Brought in home blood pressure log today.  She says the episodes of lightheadedness are much better they are not completely gone she still had a few since I last saw her.  But they are much less frequent and usually just last a few seconds and then go away she has noted that her blood pressure does run a little bit lower when that happens usually under 100.  He says also drinking Gatorade has really made a difference and helped as well.  She has noticed that she is having more anxiety when she is doing things like may be going over to her family's house to feed her dog while they are out of town.  Normally things like that do not really bother her but she is just feeling a little bit more anxious about it even though it something she really enjoys.  She wonders if we might be able to go up on her Lexapro.   Past Medical History:  Diagnosis Date   Back pain     Past Surgical History:  Procedure Laterality Date   TONSILLECTOMY     TUBAL LIGATION     bilateral    Family History  Problem Relation Age of Onset   Alcohol abuse Father    Cancer Brother     Social History   Socioeconomic History   Marital status: Widowed    Spouse name: Not on file   Number of children: 2   Years of education: 37   Highest education level: Some college, no degree  Occupational History   Occupation: Human resources officer    Comment: retired  Tobacco Use   Smoking status: Never   Smokeless tobacco: Never   Vaping Use   Vaping Use: Never used  Substance and Sexual Activity   Alcohol use: No   Drug use: Never   Sexual activity: Not Currently  Other Topics Concern   Not on file  Social History Narrative   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     albuterol (VENTOLIN HFA) 108 (90 Base) MCG/ACT inhaler TAKE 2 PUFFS BY MOUTH EVERY 6 HOURS AS NEEDED FOR WHEEZE OR SHORTNESS OF BREATH 18 g 3   betamethasone valerate ointment (VALISONE) 0.1 % APPLY TOPICALLY DAILY 45 g 1   Calcium Carb-Cholecalciferol [phone] MG-UNIT TABS Take by mouth.     diclofenac sodium (VOLTAREN) 1 % GEL Apply 4 g topically 4 (four) times daily. To affected joint. 100 g 11   fexofenadine (ALLEGRA)  180 MG tablet Take 1 tablet (180 mg total) by mouth at bedtime. 90 tablet 3   fluticasone (FLONASE) 50 MCG/ACT nasal spray Place 1 spray into both nostrils daily. 16 g 3   Multiple Vitamins-Minerals (MULTIVITAMIN WITH MINERALS) tablet Take 1 tablet by mouth daily.     omega-3 acid ethyl esters (LOVAZA) 1 g capsule TAKE 2 CAPSULES TWICE DAILY 360 capsule 3   SPIRIVA RESPIMAT 2.5 MCG/ACT AERS USE 1 INHALATION ORALLY    DAILY 8 g 2   SUMAtriptan (IMITREX) 100 MG tablet TAKE AS DIRECTED AS NEEDED 27 tablet 1   escitalopram (LEXAPRO) 10 MG tablet TAKE 1 TABLET DAILY 90 tablet 0   lisinopril (ZESTRIL) 5 MG tablet TAKE 1 TABLET (5 MG TOTAL) BY MOUTH DAILY. 90 tablet 0   No facility-administered medications prior to visit.    Allergies  Allergen Reactions   Amlodipine Other (See Comments)   Clarithromycin      ROS Review of Systems    Objective:    Physical Exam Constitutional:      Appearance: Normal appearance. She is well-developed.  HENT:     Head: Normocephalic and atraumatic.  Cardiovascular:     Rate and Rhythm: Normal rate and regular rhythm.     Heart sounds: Normal heart sounds.  Pulmonary:     Effort: Pulmonary effort is normal.     Breath sounds: Normal breath sounds.  Skin:    General: Skin is warm and dry.  Neurological:     Mental Status: She is alert and oriented to person, place, and time.  Psychiatric:        Behavior: Behavior normal.    BP 140/73   Pulse 62   Ht 5\' 6"  (1.676 m)   Wt 148 lb (67.1 kg)   SpO2 97%   BMI 23.89 kg/m  Wt Readings from Last 3 Encounters:  01/01/21 148 lb (67.1 kg)  09/03/20 146 lb (66.2 kg)  03/21/20 147 lb (66.7 kg)     There are no preventive care reminders to display for this patient.   There are no preventive care reminders to display for this patient.  Lab Results  Component Value Date   TSH 2.04 04/21/2017   Lab Results  Component Value Date   WBC 4.8 09/04/2020   HGB 13.6 09/04/2020   HCT 40.6 09/04/2020   MCV 91.6 09/04/2020   PLT 161 09/04/2020   Lab Results  Component Value Date   NA 140 09/04/2020   K 4.3 09/04/2020   CO2 27 09/04/2020   GLUCOSE 92 09/04/2020   BUN 21 09/04/2020   CREATININE 0.71 09/04/2020   BILITOT 0.7 09/04/2020   ALKPHOS 55 11/21/2014   AST 26 09/04/2020   ALT 19 09/04/2020   PROT 6.6 09/04/2020   ALBUMIN 4.2 11/21/2014   CALCIUM 9.0 09/04/2020   Lab Results  Component Value Date   CHOL 220 (H) 09/04/2020   Lab Results  Component Value Date   HDL 83 09/04/2020   Lab Results  Component Value Date   LDLCALC 123 (H) 09/04/2020   Lab Results  Component Value Date   TRIG 52 09/04/2020   Lab Results  Component Value Date   CHOLHDL 2.7 09/04/2020   No results found for: HGBA1C    Assessment & Plan:   Problem List Items Addressed This Visit        Cardiovascular and Mediastinum   Essential hypertension - Primary    Pressures in general look much better.  Much  more consistent that she still has a few days where its been low.      Relevant Medications   lisinopril (ZESTRIL) 5 MG tablet     Other   Anxiety and depression    Discussed options.  We will try going up on the Lexapro to see if it makes a difference we also discussed possibly even just switching medications in general.  She just feels like she is let a very full life and is okay with not being here at some point.  Though she denies any suicidal thoughts.      Relevant Medications   escitalopram (LEXAPRO) 20 MG tablet   Other Visit Diagnoses     Need for immunization against influenza       Relevant Orders   Flu Vaccine QUAD High Dose(Fluad) (Completed)       Meds ordered this encounter  Medications   lisinopril (ZESTRIL) 5 MG tablet    Sig: Take 1 tablet (5 mg total) by mouth daily.    Dispense:  90 tablet    Refill:  1   escitalopram (LEXAPRO) 20 MG tablet    Sig: Take 1 tablet (20 mg total) by mouth daily.    Dispense:  90 tablet    Refill:  0     Follow-up: Return in about 3 months (around 04/03/2021) for Hypertension and labs.    Nani Gasser, MD

## 2021-01-01 NOTE — Assessment & Plan Note (Signed)
Pressures in general look much better.  Much more consistent that she still has a few days where its been low.

## 2021-01-05 DIAGNOSIS — Z20822 Contact with and (suspected) exposure to covid-19: Secondary | ICD-10-CM | POA: Diagnosis not present

## 2021-01-10 ENCOUNTER — Other Ambulatory Visit: Payer: Self-pay | Admitting: Family Medicine

## 2021-02-27 ENCOUNTER — Other Ambulatory Visit: Payer: Self-pay | Admitting: Family Medicine

## 2021-02-27 DIAGNOSIS — E781 Pure hyperglyceridemia: Secondary | ICD-10-CM

## 2021-02-27 DIAGNOSIS — I10 Essential (primary) hypertension: Secondary | ICD-10-CM

## 2021-04-06 ENCOUNTER — Ambulatory Visit (INDEPENDENT_AMBULATORY_CARE_PROVIDER_SITE_OTHER): Payer: Medicare HMO | Admitting: Family Medicine

## 2021-04-06 ENCOUNTER — Other Ambulatory Visit: Payer: Self-pay

## 2021-04-06 ENCOUNTER — Encounter: Payer: Self-pay | Admitting: Family Medicine

## 2021-04-06 VITALS — BP 129/68 | HR 69 | Resp 18 | Ht 66.0 in | Wt 147.0 lb

## 2021-04-06 DIAGNOSIS — F4321 Adjustment disorder with depressed mood: Secondary | ICD-10-CM | POA: Diagnosis not present

## 2021-04-06 DIAGNOSIS — F32A Depression, unspecified: Secondary | ICD-10-CM | POA: Diagnosis not present

## 2021-04-06 DIAGNOSIS — I10 Essential (primary) hypertension: Secondary | ICD-10-CM | POA: Diagnosis not present

## 2021-04-06 DIAGNOSIS — F419 Anxiety disorder, unspecified: Secondary | ICD-10-CM

## 2021-04-06 LAB — BASIC METABOLIC PANEL
BUN: 15 mg/dL (ref 7–25)
CO2: 27 mmol/L (ref 20–32)
Calcium: 9.1 mg/dL (ref 8.6–10.4)
Chloride: 106 mmol/L (ref 98–110)
Creat: 0.7 mg/dL (ref 0.60–0.95)
Glucose, Bld: 89 mg/dL (ref 65–139)
Potassium: 4.1 mmol/L (ref 3.5–5.3)
Sodium: 141 mmol/L (ref 135–146)

## 2021-04-06 MED ORDER — ESCITALOPRAM OXALATE 20 MG PO TABS: 20.0000 mg | ORAL_TABLET | Freq: Every day | ORAL | 1 refills | Status: DC

## 2021-04-06 NOTE — Assessment & Plan Note (Signed)
Doing well on 20 mg Lexapro we will go ahead and send refills to the pharmacy.  At some point if she is doing well we can always discuss going back down if possible.

## 2021-04-06 NOTE — Assessment & Plan Note (Signed)
Well controlled. Continue current regimen. Follow up in  6 mo  

## 2021-04-06 NOTE — Progress Notes (Signed)
Established Patient Office Visit  Subjective:  Patient ID: Christy Gutierrez, female    DOB: Dec 04, 1940  Age: 81 y.o. MRN: [id number]  CC:  Chief Complaint  Patient presents with   Hypertension    Follow up    Medication Follow up     Lexparo. Patient states Lexapro is working well for her.     HPI Christy Gutierrez presents for   Hypertension- Pt denies chest pain, SOB, dizziness, or heart palpitations.  Taking meds as directed w/o problems.  Denies medication side effects.    F/U Lexapro. She feels it is working well.  We did increase her Lexapro from 10 mg to 20 mg when saw her in November.  She was just having some increase in anxiety.  Unfortunately she just lost her sister-in-law last night.  That is the last person and her husband's family except for their son who is really struggling.  And she is trying to help him.  Her sister-in-law has been sick for a couple of months and was on hospice.  She does feel like the increased dose of the Lexapro has really helped her through this time.  Migraines-overall she has been doing well but she did start getting a headache yesterday.  It eased off a little bit and then she woke up with it again this morning.  She thinks a lot of it stress related.  Past Medical History:  Diagnosis Date   Back pain     Past Surgical History:  Procedure Laterality Date   TONSILLECTOMY     TUBAL LIGATION     bilateral    Family History  Problem Relation Age of Onset   Alcohol abuse Father    Cancer Brother     Social History   Socioeconomic History   Marital status: Widowed    Spouse name: Not on file   Number of children: 2   Years of education: 2   Highest education level: Some college, no degree  Occupational History   Occupation: Human resources officer    Comment: retired  Tobacco Use   Smoking status: Never   Smokeless tobacco: Never  Vaping Use   Vaping Use: Never used  Substance and Sexual Activity   Alcohol use: No   Drug use: Never   Sexual  activity: Not Currently  Other Topics Concern   Not on file  Social History Narrative   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     albuterol (VENTOLIN HFA) 108 (90 Base) MCG/ACT inhaler TAKE 2 PUFFS BY MOUTH EVERY 6 HOURS AS NEEDED FOR WHEEZE OR SHORTNESS OF BREATH 18 g 3   betamethasone valerate ointment (VALISONE) 0.1 % APPLY TOPICALLY DAILY 45 g 1   Calcium Carb-Cholecalciferol [phone] MG-UNIT TABS Take by mouth.     diclofenac sodium (VOLTAREN) 1 % GEL Apply 4 g topically 4 (four) times daily. To affected joint. 100 g 11   fexofenadine (ALLEGRA) 180 MG tablet Take 1 tablet (180 mg total) by mouth at bedtime. 90 tablet 3  fluticasone (FLONASE) 50 MCG/ACT nasal spray Place 1 spray into both nostrils daily. 16 g 3   lisinopril (ZESTRIL) 5 MG tablet Take 1 tablet (5 mg total) by mouth daily. 90 tablet 1   Multiple Vitamins-Minerals (MULTIVITAMIN WITH MINERALS) tablet Take 1 tablet by mouth daily.     omega-3 acid ethyl esters (LOVAZA) 1 g capsule TAKE 2 CAPSULES TWICE DAILY 360 capsule 3   SPIRIVA RESPIMAT 2.5 MCG/ACT AERS USE 1 INHALATION ORALLY    DAILY 8 g 2   SUMAtriptan (IMITREX) 100 MG tablet TAKE AS DIRECTED AS NEEDED 27 tablet 1   escitalopram (LEXAPRO) 20 MG tablet Take 1 tablet (20 mg total) by mouth daily. 90 tablet 0   No facility-administered medications prior to visit.    Allergies  Allergen Reactions   Amlodipine Other (See Comments)   Clarithromycin     ROS Review of Systems    Objective:    Physical Exam Constitutional:       Appearance: Normal appearance. She is well-developed.  HENT:     Head: Normocephalic and atraumatic.  Cardiovascular:     Rate and Rhythm: Normal rate and regular rhythm.     Heart sounds: Normal heart sounds.  Pulmonary:     Effort: Pulmonary effort is normal.     Breath sounds: Normal breath sounds.  Skin:    General: Skin is warm and dry.  Neurological:     Mental Status: She is alert and oriented to person, place, and time.  Psychiatric:        Behavior: Behavior normal.    BP 129/68    Pulse 69    Resp 18    Ht 5\' 6"  (1.676 m)    Wt 147 lb (66.7 kg)    SpO2 98%    BMI 23.73 kg/m  Wt Readings from Last 3 Encounters:  04/06/21 147 lb (66.7 kg)  01/01/21 148 lb (67.1 kg)  09/03/20 146 lb (66.2 kg)     There are no preventive care reminders to display for this patient.  There are no preventive care reminders to display for this patient.  Lab Results  Component Value Date   TSH 2.04 04/21/2017   Lab Results  Component Value Date   WBC 4.8 09/04/2020   HGB 13.6 09/04/2020   HCT 40.6 09/04/2020   MCV 91.6 09/04/2020   PLT 161 09/04/2020   Lab Results  Component Value Date   NA 140 09/04/2020   K 4.3 09/04/2020   CO2 27 09/04/2020   GLUCOSE 92 09/04/2020   BUN 21 09/04/2020   CREATININE 0.71 09/04/2020   BILITOT 0.7 09/04/2020   ALKPHOS 55 11/21/2014   AST 26 09/04/2020   ALT 19 09/04/2020   PROT 6.6 09/04/2020   ALBUMIN 4.2 11/21/2014   CALCIUM 9.0 09/04/2020   Lab Results  Component Value Date   CHOL 220 (H) 09/04/2020   Lab Results  Component Value Date   HDL 83 09/04/2020   Lab Results  Component Value Date   LDLCALC 123 (H) 09/04/2020   Lab Results  Component Value Date   TRIG 52 09/04/2020   Lab Results  Component Value Date   CHOLHDL 2.7 09/04/2020   No results found for: HGBA1C    Assessment & Plan:   Problem List Items Addressed This Visit       Cardiovascular and Mediastinum   Essential hypertension    Well controlled.  Continue current regimen. Follow up in  6 mo  Relevant Orders   Basic Metabolic Panel (BMET)     Other   Anxiety and depression - Primary    Doing well on 20 mg Lexapro we will go ahead and send refills to the pharmacy.  At some point if she is doing well we can always discuss going back down if possible.      Relevant Medications   escitalopram (LEXAPRO) 20 MG tablet   Other Visit Diagnoses     Grief           Meds ordered this encounter  Medications   escitalopram (LEXAPRO) 20 MG tablet    Sig: Take 1 tablet (20 mg total) by mouth daily.    Dispense:  90 tablet    Refill:  1    Follow-up: Return in about 6 months (around 10/04/2021) for Hypertension, Mood.    Nani Gasser, MD

## 2021-04-07 NOTE — Progress Notes (Signed)
Your lab work is within acceptable range and there are no concerning findings.   ?

## 2021-05-26 ENCOUNTER — Other Ambulatory Visit: Payer: Self-pay | Admitting: Family Medicine

## 2021-06-09 ENCOUNTER — Other Ambulatory Visit: Payer: Self-pay

## 2021-06-09 DIAGNOSIS — E781 Pure hyperglyceridemia: Secondary | ICD-10-CM

## 2021-06-09 MED ORDER — OMEGA-3-ACID ETHYL ESTERS 1 G PO CAPS: 2.0000 | ORAL_CAPSULE | Freq: Two times a day (BID) | ORAL | 3 refills | Status: DC

## 2021-09-20 ENCOUNTER — Other Ambulatory Visit: Payer: Self-pay | Admitting: Family Medicine

## 2021-09-20 DIAGNOSIS — F32A Depression, unspecified: Secondary | ICD-10-CM

## 2021-10-05 ENCOUNTER — Encounter: Payer: Self-pay | Admitting: Family Medicine

## 2021-10-05 ENCOUNTER — Ambulatory Visit (INDEPENDENT_AMBULATORY_CARE_PROVIDER_SITE_OTHER): Payer: Medicare HMO | Admitting: Family Medicine

## 2021-10-05 VITALS — BP 137/81 | HR 72 | Ht 66.0 in | Wt 148.0 lb

## 2021-10-05 DIAGNOSIS — E785 Hyperlipidemia, unspecified: Secondary | ICD-10-CM

## 2021-10-05 DIAGNOSIS — F32A Depression, unspecified: Secondary | ICD-10-CM | POA: Diagnosis not present

## 2021-10-05 DIAGNOSIS — I10 Essential (primary) hypertension: Secondary | ICD-10-CM | POA: Diagnosis not present

## 2021-10-05 DIAGNOSIS — F419 Anxiety disorder, unspecified: Secondary | ICD-10-CM

## 2021-10-05 NOTE — Assessment & Plan Note (Signed)
Due to recheck lipids.  She is taking her medication consistently.

## 2021-10-05 NOTE — Assessment & Plan Note (Addendum)
Home blood pressures look great so we will continue to monitor.  Due for updated lab work today.

## 2021-10-05 NOTE — Assessment & Plan Note (Addendum)
She does feel improvement with the 20 mg Lexapro and is happy with her current regimen.  Plan to follow-up in 6 months.

## 2021-10-05 NOTE — Progress Notes (Signed)
Established Patient Office Visit  Subjective   Patient ID: Christy Gutierrez, female    DOB: 11-19-1940  Age: 81 y.o. MRN: [id number]  Chief Complaint  Patient presents with   Follow-up    6 month fup htn/ mood     HPI  Hypertension- Pt denies chest pain, SOB, dizziness, or heart palpitations.  Taking meds as directed w/o problems.  Denies medication side effects.  Running home blood pressure log.  Some of the blood pressures are actually low with a systolic less than 100 there are few in the 140s but most of them look great.  Last low blood pressures were actually in May that she recorded.  She says she has not had any more episodes of feeling like she was going to blackout.  F/U Mood -currently on fluoxetine 20 mg daily.  She does feel like the increase to 20 mg has been helpful.  She says she still has some days where she really just misses her old life and does not find as much pleasure and joy in things that she used to but does not feel hopeless and has had no thoughts of harming herself.  She stays active.  She also lost her sister-in-law back in May.  He has noticed that occasionally when she rolls over in bed in the morning and then sits up that she will feel off balance just for couple seconds and then it quickly goes away it does not happen during the day.    ROS    Objective:     BP 137/81   Pulse 72   Ht 5\' 6"  (1.676 m)   Wt 148 lb (67.1 kg)   SpO2 99%   BMI 23.89 kg/m    Physical Exam Vitals and nursing note reviewed.  Constitutional:      Appearance: She is well-developed.  HENT:     Head: Normocephalic and atraumatic.  Cardiovascular:     Rate and Rhythm: Normal rate and regular rhythm.     Heart sounds: Normal heart sounds.  Pulmonary:     Effort: Pulmonary effort is normal.     Breath sounds: Normal breath sounds.  Skin:    General: Skin is warm and dry.  Neurological:     Mental Status: She is alert and oriented to person, place, and time.   Psychiatric:        Behavior: Behavior normal.      No results found for any visits on 10/05/21.    The ASCVD Risk score (Arnett DK, et al., 2019) failed to calculate for the following reasons:   The 2019 ASCVD risk score is only valid for ages 40 to 35    Assessment & Plan:   Problem List Items Addressed This Visit       Cardiovascular and Mediastinum   Essential hypertension - Primary    Home blood pressures look great so we will continue to monitor.  Due for updated lab work today.      Relevant Orders   COMPLETE METABOLIC PANEL WITH GFR   Lipid Panel w/reflex Direct LDL     Other   Hyperlipidemia    Due to recheck lipids.  She is taking her medication consistently.      Anxiety and depression    She does feel improvement with the 20 mg Lexapro and is happy with her current regimen.  Plan to follow-up in 6 months.       Return in about 6 months (around 04/07/2022) for  Hypertension.    Beatrice Lecher, MD

## 2021-10-06 LAB — COMPLETE METABOLIC PANEL WITH GFR
AG Ratio: 2 (calc) (ref 1.0–2.5)
ALT: 29 U/L (ref 6–29)
AST: 30 U/L (ref 10–35)
Albumin: 4.3 g/dL (ref 3.6–5.1)
Alkaline phosphatase (APISO): 55 U/L (ref 37–153)
BUN: 15 mg/dL (ref 7–25)
CO2: 27 mmol/L (ref 20–32)
Calcium: 9.3 mg/dL (ref 8.6–10.4)
Chloride: 106 mmol/L (ref 98–110)
Creat: 0.74 mg/dL (ref 0.60–0.95)
Globulin: 2.1 g/dL (calc) (ref 1.9–3.7)
Glucose, Bld: 80 mg/dL (ref 65–99)
Potassium: 4.6 mmol/L (ref 3.5–5.3)
Sodium: 141 mmol/L (ref 135–146)
Total Bilirubin: 0.6 mg/dL (ref 0.2–1.2)
Total Protein: 6.4 g/dL (ref 6.1–8.1)
eGFR: 80 mL/min/{1.73_m2} (ref >=60)

## 2021-10-06 LAB — LIPID PANEL W/REFLEX DIRECT LDL
Cholesterol: 184 mg/dL (ref <200)
HDL: 66 mg/dL (ref >=50)
LDL Cholesterol (Calc): 100 mg/dL (calc) — ABNORMAL HIGH
Non-HDL Cholesterol (Calc): 118 mg/dL (calc) (ref <130)
Total CHOL/HDL Ratio: 2.8 (calc) (ref <5.0)
Triglycerides: 85 mg/dL (ref <150)

## 2021-10-06 NOTE — Progress Notes (Signed)
Hi Chastidy, LDL looks much better!  Great work!  Please continue with the [place].  Your metabolic panel is also normal.

## 2021-10-21 ENCOUNTER — Encounter: Payer: Self-pay | Admitting: General Practice

## 2021-11-22 ENCOUNTER — Other Ambulatory Visit: Payer: Self-pay | Admitting: Family Medicine

## 2021-12-29 ENCOUNTER — Other Ambulatory Visit: Payer: Self-pay | Admitting: *Deleted

## 2021-12-29 DIAGNOSIS — F32A Depression, unspecified: Secondary | ICD-10-CM

## 2021-12-29 MED ORDER — ESCITALOPRAM OXALATE 20 MG PO TABS: 20.0000 mg | ORAL_TABLET | Freq: Every day | ORAL | 1 refills | Status: DC

## 2022-02-03 ENCOUNTER — Telehealth: Payer: Self-pay | Admitting: Family Medicine

## 2022-02-03 DIAGNOSIS — R6889 Other general symptoms and signs: Secondary | ICD-10-CM

## 2022-02-03 NOTE — Telephone Encounter (Signed)
Call patient and let her know that I did receive the screening is results for peripheral vascular disease that was done via her Medicare plan.  They did note some abnormalities so this needs to be followed up with formal testing called ABIs.  These are typically scheduled at a vascular lab.  If she is okay with moving forward with further screening then please let me know.

## 2022-02-04 NOTE — Telephone Encounter (Signed)
Left message for a return call

## 2022-02-05 NOTE — Telephone Encounter (Signed)
Called and informed pt of recommendations. She would like to move forward with ABI's  Order pended for review and signature

## 2022-02-08 NOTE — Telephone Encounter (Signed)
Order placed but unfortunately I cannot print it to place in the basket to be scheduled it keeps giving me a printer air.

## 2022-02-09 NOTE — Telephone Encounter (Signed)
Order printed and placed in referral box.

## 2022-04-07 ENCOUNTER — Encounter: Payer: Self-pay | Admitting: Family Medicine

## 2022-04-07 ENCOUNTER — Ambulatory Visit (INDEPENDENT_AMBULATORY_CARE_PROVIDER_SITE_OTHER): Payer: Medicare HMO | Admitting: Family Medicine

## 2022-04-07 VITALS — BP 136/77 | HR 102 | Ht 66.0 in | Wt 144.0 lb

## 2022-04-07 DIAGNOSIS — F419 Anxiety disorder, unspecified: Secondary | ICD-10-CM | POA: Diagnosis not present

## 2022-04-07 DIAGNOSIS — F32A Depression, unspecified: Secondary | ICD-10-CM | POA: Diagnosis not present

## 2022-04-07 DIAGNOSIS — J439 Emphysema, unspecified: Secondary | ICD-10-CM | POA: Diagnosis not present

## 2022-04-07 DIAGNOSIS — Z23 Encounter for immunization: Secondary | ICD-10-CM | POA: Diagnosis not present

## 2022-04-07 DIAGNOSIS — I10 Essential (primary) hypertension: Secondary | ICD-10-CM

## 2022-04-07 MED ORDER — BETAMETHASONE VALERATE 0.1 % EX OINT: TOPICAL_OINTMENT | Freq: Every day | CUTANEOUS | 1 refills | Status: DC

## 2022-04-07 MED ORDER — SPIRIVA RESPIMAT 2.5 MCG/ACT IN AERS: INHALATION_SPRAY | RESPIRATORY_TRACT | 4 refills | Status: DC

## 2022-04-07 NOTE — Assessment & Plan Note (Signed)
Stable.  No recent flares she is still using her Symbicort.

## 2022-04-07 NOTE — Assessment & Plan Note (Signed)
Blood pressure at goal.  Follow-up in 6 months.

## 2022-04-07 NOTE — Progress Notes (Signed)
   Established Patient Office Visit  Subjective   Patient ID: Christy Gutierrez, female    DOB: 1940-05-18  Age: 82 y.o. MRN: [id number]  Chief Complaint  Patient presents with   Hypertension    HPI   Hypertension- Pt denies chest pain, SOB, dizziness, or heart palpitations.  Taking meds as directed w/o problems.  Denies medication side effects.    Follow-up COPD/emphysema-overall she is doing well no recent flares or exacerbations.  She has noticed some balance issues more so at night especially if she is walking outside.  She says it does not bother her much during the day but usually if she just holds onto something she does well.    ROS    Objective:     BP 136/77   Pulse (!) 102   Ht 5\' 6"  (1.676 m)   Wt 144 lb (65.3 kg)   SpO2 97%   BMI 23.24 kg/m    Physical Exam Vitals and nursing note reviewed.  Constitutional:      Appearance: She is well-developed.  HENT:     Head: Normocephalic and atraumatic.  Cardiovascular:     Rate and Rhythm: Normal rate and regular rhythm.     Heart sounds: Normal heart sounds.  Pulmonary:     Effort: Pulmonary effort is normal.     Breath sounds: Normal breath sounds.  Skin:    General: Skin is warm and dry.  Neurological:     Mental Status: She is alert and oriented to person, place, and time.  Psychiatric:        Behavior: Behavior normal.      No results found for any visits on 04/07/22.    The ASCVD Risk score (Arnett DK, et al., 2019) failed to calculate for the following reasons:   The 2019 ASCVD risk score is only valid for ages 52 to 31    Assessment & Plan:   Problem List Items Addressed This Visit       Cardiovascular and Mediastinum   Essential hypertension - Primary    Blood pressure at goal.  Follow-up in 6 months.      Relevant Orders   BASIC METABOLIC PANEL WITH GFR     Respiratory   Emphysema lung (HCC)    Stable.  No recent flares she is still using her Symbicort.      Relevant Medications    Tiotropium Bromide Monohydrate (SPIRIVA RESPIMAT) 2.5 MCG/ACT AERS     Other   Anxiety and depression    B with current regimen and dose.  Continue current dose of Lexapro.       Return in about 6 months (around 10/06/2022) for Hypertension.    Beatrice Lecher, MD

## 2022-04-07 NOTE — Assessment & Plan Note (Signed)
B with current regimen and dose.  Continue current dose of Lexapro.

## 2022-04-08 LAB — BASIC METABOLIC PANEL WITH GFR
BUN: 16 mg/dL (ref 7–25)
CO2: 29 mmol/L (ref 20–32)
Calcium: 9.8 mg/dL (ref 8.6–10.4)
Chloride: 105 mmol/L (ref 98–110)
Creat: 0.76 mg/dL (ref 0.60–0.95)
Glucose, Bld: 71 mg/dL (ref 65–99)
Potassium: 4.5 mmol/L (ref 3.5–5.3)
Sodium: 141 mmol/L (ref 135–146)
eGFR: 78 mL/min/{1.73_m2} (ref >=60)

## 2022-04-08 NOTE — Progress Notes (Signed)
Your lab work is within acceptable range and there are no concerning findings.   ?

## 2022-05-20 ENCOUNTER — Ambulatory Visit (INDEPENDENT_AMBULATORY_CARE_PROVIDER_SITE_OTHER): Payer: Medicare HMO | Admitting: Family Medicine

## 2022-05-20 ENCOUNTER — Encounter: Payer: Self-pay | Admitting: Family Medicine

## 2022-05-20 VITALS — BP 134/89 | HR 58 | Ht 66.0 in | Wt 141.0 lb

## 2022-05-20 DIAGNOSIS — L989 Disorder of the skin and subcutaneous tissue, unspecified: Secondary | ICD-10-CM

## 2022-05-20 DIAGNOSIS — L03313 Cellulitis of chest wall: Secondary | ICD-10-CM | POA: Diagnosis not present

## 2022-05-20 MED ORDER — DOXYCYCLINE HYCLATE 100 MG PO TABS: 100.0000 mg | ORAL_TABLET | Freq: Two times a day (BID) | ORAL | 0 refills | Status: DC

## 2022-05-20 NOTE — Progress Notes (Signed)
Pt reports that she had a bump that popped up on her chest a few weeks ago but the by the end of last week it began to get red spots around it. The area is painful. She has been using neosporin, cortisone cream, benadryl, and cocco butter.   She informed me that she has had bumps like these pop up in various places for the past 3 years and she thought that this was normal because she has a family hx of this. She showed me several places that bumps have come up L upper arm, R forearm, and R cheek. She said that usually they will pop up and go away after some time.

## 2022-05-20 NOTE — Progress Notes (Signed)
   Acute Office Visit  Subjective:     Patient ID: Christy Gutierrez, female    DOB: August 08, 1940, 82 y.o.   MRN: [id number]  Chief Complaint  Patient presents with   sore on chest    HPI Patient is in today for rash. In middle of upper chest x 3 weeks.  Redness started around end of last week. Used hydrocortisone, Benadryl. Coco butter. Says it is painful.   She says she has 2 similar lesion been there for couple years.  But she says these have but they started out very similar.  She has 1 on the right forearm near the antecubital fossa that tends to get more scale or crust on it.  And then 1 on her left upper arm that stays raised and red.  They are not painful or tender.  ROS      Objective:    BP 134/89   Pulse (!) 58   Ht 5\' 6"  (1.676 m)   Wt 141 lb (64 kg)   SpO2 98%   BMI 22.76 kg/m    Physical Exam Vitals reviewed.  Constitutional:      Appearance: She is well-developed.  HENT:     Head: Normocephalic and atraumatic.  Eyes:     Conjunctiva/sclera: Conjunctivae normal.  Cardiovascular:     Rate and Rhythm: Normal rate.  Pulmonary:     Effort: Pulmonary effort is normal.  Skin:    General: Skin is dry.     Coloration: Skin is not pale.     Comments: 7-8 mm crusting lesion on the right forearm near antecubital foss. Approx 8-9 mm lesion on the left upper arm that is shiny and papular and pink.  Ares on mid chest is papular with a dry crust in the center.  Erythema about 1 cm out from edge and bruising around the lesion.   Neurological:     Mental Status: She is alert and oriented to person, place, and time.  Psychiatric:        Behavior: Behavior normal.     No results found for any visits on 05/20/22.      Assessment & Plan:   Problem List Items Addressed This Visit   None Visit Diagnoses     Cellulitis of chest wall    -  Primary   Relevant Orders   Ambulatory referral to Dermatology   Wound culture   Skin lesion       Relevant Orders   Ambulatory  referral to Dermatology      The lesion on her chest is very inflamed and erythematous. No active drainage. I did remove the crust with a blade and sent a culture.  Start doxy.  Call if not improving.  Will call her once with cultures back.  Wonders how much of this may just be inflammation from trying to apply a lot of creams and topicals to the area.  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 Needs biopsy and further treatment.   Meds ordered this encounter  Medications   doxycycline (VIBRA-TABS) 100 MG tablet    Sig: Take 1 tablet (100 mg total) by mouth 2 (two) times daily.    Dispense:  14 tablet    Refill:  0    No follow-ups on file.  Beatrice Lecher, MD

## 2022-05-23 LAB — WOUND CULTURE
MICRO NUMBER:: 14723892
SPECIMEN QUALITY:: ADEQUATE

## 2022-05-24 NOTE — Progress Notes (Signed)
Christy Gutierrez, skin culture came back negative for any worrisome bacteria.  But if you feel like the antibiotic is helping then make sure to just complete the course.

## 2022-05-25 ENCOUNTER — Other Ambulatory Visit: Payer: Self-pay | Admitting: *Deleted

## 2022-05-25 MED ORDER — LISINOPRIL 5 MG PO TABS: 5.0000 mg | ORAL_TABLET | Freq: Every day | ORAL | 1 refills | Status: DC

## 2022-05-31 HISTORY — PX: SKIN CANCER EXCISION: SHX779

## 2022-06-10 ENCOUNTER — Telehealth: Payer: Self-pay | Admitting: Family Medicine

## 2022-06-10 NOTE — Telephone Encounter (Signed)
Contacted Christy Gutierrez to schedule their annual wellness visit. Appointment made for 07/13/2022.  Cira Servant Patient Engineer, production II Direct Dial: [phone]

## 2022-07-09 ENCOUNTER — Encounter: Payer: Self-pay | Admitting: Family Medicine

## 2022-07-13 ENCOUNTER — Ambulatory Visit (INDEPENDENT_AMBULATORY_CARE_PROVIDER_SITE_OTHER): Payer: Medicare HMO | Admitting: Family Medicine

## 2022-07-13 DIAGNOSIS — Z780 Asymptomatic menopausal state: Secondary | ICD-10-CM | POA: Diagnosis not present

## 2022-07-13 DIAGNOSIS — Z0000 Encounter for general adult medical examination without abnormal findings: Secondary | ICD-10-CM

## 2022-07-13 NOTE — Patient Instructions (Signed)
MEDICARE ANNUAL WELLNESS VISIT Health Maintenance Summary and Written Plan of Care  Ms. Stenner ,  Thank you for allowing me to perform your Medicare Annual Wellness Visit and for your ongoing commitment to your health.   Health Maintenance & Immunization History Health Maintenance  Topic Date Due   Pneumonia Vaccine 66+ Years old (1 of 2 - PCV) 04/08/2023 (Originally 09/27/1946)   Zoster Vaccines- Shingrix (1 of 2) 07/06/2023 (Originally 09/27/1988)   INFLUENZA VACCINE  09/30/2022   DTaP/Tdap/Td (2 - Td or Tdap) 05/09/2023   Medicare Annual Wellness (AWV)  07/13/2023   DEXA SCAN  Completed   HPV VACCINES  Aged Out   COVID-19 Vaccine  Discontinued   Immunization History  Administered Date(s) Administered   Fluad Quad(high Dose 65+) 12/26/2018, 01/01/2021, 04/07/2022   Tdap 05/08/2013    These are the patient goals that we discussed:  Goals Addressed               This Visit's Progress     Patient Stated (pt-stated)        Patient stated that she would like to be able to walk more.         This is a list of Health Maintenance Items that are overdue or due now: Pneumococcal vaccine  Shingles vaccine Dexa scan  Patient declined the vaccines.     Orders/Referrals Placed Today: No orders of the defined types were placed in this encounter.  (Contact our referral department at [phone] if you have not spoken with someone about your referral appointment within the next 5 days)    Follow-up Plan Follow-up with Agapito Games, MD as planned Medicare wellness visit in one year.  Patient will access AVS on my chart.      Health Maintenance, Female Adopting a healthy lifestyle and getting preventive care are important in promoting health and wellness. Ask your health care provider about: The right schedule for you to have regular tests and exams. Things you can do on your own to prevent diseases and keep yourself healthy. What should I know about diet,  weight, and exercise? Eat a healthy diet  Eat a diet that includes plenty of vegetables, fruits, low-fat dairy products, and lean protein. Do not eat a lot of foods that are high in solid fats, added sugars, or sodium. Maintain a healthy weight Body mass index (BMI) is used to identify weight problems. It estimates body fat based on height and weight. Your health care provider can help determine your BMI and help you achieve or maintain a healthy weight. Get regular exercise Get regular exercise. This is one of the most important things you can do for your health. Most adults should: Exercise for at least 150 minutes each week. The exercise should increase your heart rate and make you sweat (moderate-intensity exercise). Do strengthening exercises at least twice a week. This is in addition to the moderate-intensity exercise. Spend less time sitting. Even light physical activity can be beneficial. Watch cholesterol and blood lipids Have your blood tested for lipids and cholesterol at 84 years of age, then have this test every 5 years. Have your cholesterol levels checked more often if: Your lipid or cholesterol levels are high. You are older than 84 years of age. You are at high risk for heart disease. What should I know about cancer screening? Depending on your health history and family history, you may need to have cancer screening at various ages. This may include screening for: Breast cancer. Cervical  cancer. Colorectal cancer. Skin cancer. Lung cancer. What should I know about heart disease, diabetes, and high blood pressure? Blood pressure and heart disease High blood pressure causes heart disease and increases the risk of stroke. This is more likely to develop in people who have high blood pressure readings or are overweight. Have your blood pressure checked: Every 3-5 years if you are 28-9 years of age. Every year if you are 75 years old or older. Diabetes Have regular  diabetes screenings. This checks your fasting blood sugar level. Have the screening done: Once every three years after age 72 if you are at a normal weight and have a low risk for diabetes. More often and at a younger age if you are overweight or have a high risk for diabetes. What should I know about preventing infection? Hepatitis B If you have a higher risk for hepatitis B, you should be screened for this virus. Talk with your health care provider to find out if you are at risk for hepatitis B infection. Hepatitis C Testing is recommended for: Everyone born from [birth year] through 1967. Anyone with known risk factors for hepatitis C. Sexually transmitted infections (STIs) Get screened for STIs, including gonorrhea and chlamydia, if: You are sexually active and are younger than 84 years of age. You are older than 84 years of age and your health care provider tells you that you are at risk for this type of infection. Your sexual activity has changed since you were last screened, and you are at increased risk for chlamydia or gonorrhea. Ask your health care provider if you are at risk. Ask your health care provider about whether you are at high risk for HIV. Your health care provider may recommend a prescription medicine to help prevent HIV infection. If you choose to take medicine to prevent HIV, you should first get tested for HIV. You should then be tested every 3 months for as long as you are taking the medicine. Pregnancy If you are about to stop having your period (premenopausal) and you may become pregnant, seek counseling before you get pregnant. Take 400 to 800 micrograms (mcg) of folic acid every day if you become pregnant. Ask for birth control (contraception) if you want to prevent pregnancy. Osteoporosis and menopause Osteoporosis is a disease in which the bones lose minerals and strength with aging. This can result in bone fractures. If you are 42 years old or older, or if you are at  risk for osteoporosis and fractures, ask your health care provider if you should: Be screened for bone loss. Take a calcium or vitamin D supplement to lower your risk of fractures. Be given hormone replacement therapy (HRT) to treat symptoms of menopause. Follow these instructions at home: Alcohol use Do not drink alcohol if: Your health care provider tells you not to drink. You are pregnant, may be pregnant, or are planning to become pregnant. If you drink alcohol: Limit how much you have to: 0-1 drink a day. Know how much alcohol is in your drink. In the [place] one drink equals one 12 oz bottle of beer (355 mL), one 5 oz glass of wine (148 mL), or one 1 oz glass of hard liquor (44 mL). Lifestyle Do not use any products that contain nicotine or tobacco. These products include cigarettes, chewing tobacco, and vaping devices, such as e-cigarettes. If you need help quitting, ask your health care provider. Do not use street drugs. Do not share needles. Ask your health care provider for  help if you need support or information about quitting drugs. General instructions Schedule regular health, dental, and eye exams. Stay current with your vaccines. Tell your health care provider if: You often feel depressed. You have ever been abused or do not feel safe at home. Summary Adopting a healthy lifestyle and getting preventive care are important in promoting health and wellness. Follow your health care provider's instructions about healthy diet, exercising, and getting tested or screened for diseases. Follow your health care provider's instructions on monitoring your cholesterol and blood pressure. This information is not intended to replace advice given to you by your health care provider. Make sure you discuss any questions you have with your health care provider. Document Revised: 07/07/2020 Document Reviewed: 07/07/2020 Elsevier Patient Education  2023 [employer].

## 2022-07-13 NOTE — Progress Notes (Signed)
MEDICARE ANNUAL WELLNESS VISIT  07/13/2022  Telephone Visit Disclaimer This Medicare AWV was conducted by telephone due to national recommendations for restrictions regarding the COVID-19 Pandemic (e.g. social distancing).  I verified, using two identifiers, that I am speaking with Christy Gutierrez or their authorized healthcare agent. I discussed the limitations, risks, security, and privacy concerns of performing an evaluation and management service by telephone and the potential availability of an in-person appointment in the future. The patient expressed understanding and agreed to proceed.  Location of Patient: home Location of Provider (nurse):  in the office.  Subjective:    Christy Gutierrez is a 82 y.o. female patient of Metheney, Barbarann Ehlers, MD who had a Medicare Annual Wellness Visit today via telephone. Christy Gutierrez is Retired and lives alone. she has 2 children. she reports that she is socially active and does interact with friends/family regularly. she is moderately physically active and enjoys singing.  Patient Care Team: Agapito Games, MD as PCP - General (Family Medicine)     07/13/2022    2:04 PM 05/01/2019   11:06 AM 04/21/2016    9:32 AM  Advanced Directives  Does Patient Have a Medical Advance Directive? Yes Yes Yes  Type of Advance Directive Living will Healthcare Power of [place];Living will Healthcare Power of [place];Living will  Does patient want to make changes to medical advance directive? No - Patient declined No - Patient declined   Copy of Healthcare Power of Attorney in Chart?  No - copy requested     Hospital Utilization Over the Past 12 Months: # of hospitalizations or ER visits: 0 # of surgeries: 1  Review of Systems    Patient reports that her overall health is unchanged compared to last year.  History obtained from chart review and the patient  Patient Reported Readings (BP, Pulse, CBG, Weight, etc) none  Pain Assessment Pain : No/denies pain      Current Medications & Allergies (verified) Allergies as of 07/13/2022       Reactions   Amlodipine Other (See Comments)   Clarithromycin         Medication List        Accurate as of Jul 13, 2022  2:14 PM. If you have any questions, ask your nurse or doctor.          STOP taking these medications    doxycycline 100 MG tablet Commonly known as: VIBRA-TABS       TAKE these medications    acetaminophen-caffeine 500-65 MG Tabs per tablet Commonly known as: EXCEDRIN TENSION HEADACHE Take by mouth.   albuterol 108 (90 Base) MCG/ACT inhaler Commonly known as: VENTOLIN HFA TAKE 2 PUFFS BY MOUTH EVERY 6 HOURS AS NEEDED FOR WHEEZE OR SHORTNESS OF BREATH   betamethasone valerate ointment 0.1 % Commonly known as: VALISONE Apply topically daily.   Calcium Carb-Cholecalciferol [phone] MG-UNIT Tabs Take by mouth.   diclofenac sodium 1 % Gel Commonly known as: VOLTAREN Apply 4 g topically 4 (four) times daily. To affected joint.   escitalopram 20 MG tablet Commonly known as: LEXAPRO Take 1 tablet (20 mg total) by mouth daily.   fexofenadine 180 MG tablet Commonly known as: ALLEGRA Take 1 tablet (180 mg total) by mouth at bedtime.   fluticasone 50 MCG/ACT nasal spray Commonly known as: FLONASE Place 1 spray into both nostrils daily.   lisinopril 5 MG tablet Commonly known as: ZESTRIL Take 1 tablet (5 mg total) by mouth daily.   multivitamin with minerals tablet  Take 1 tablet by mouth daily.   omega-3 acid ethyl esters 1 g capsule Commonly known as: LOVAZA Take 2 capsules (2 g total) by mouth 2 (two) times daily.   Spiriva Respimat 2.5 MCG/ACT Aers Generic drug: Tiotropium Bromide Monohydrate USE 1 INHALATION ORALLY    DAILY   SUMAtriptan 100 MG tablet Commonly known as: IMITREX TAKE AS DIRECTED AS NEEDED        History (reviewed): Past Medical History:  Diagnosis Date   Back pain    Past Surgical History:  Procedure Laterality Date   SKIN  CANCER EXCISION  05/2022   on the chest   TONSILLECTOMY     TUBAL LIGATION     bilateral   Family History  Problem Relation Age of Onset   Alcohol abuse Father    Cancer Brother    Social History   Socioeconomic History   Marital status: Widowed    Spouse name: Not on file   Number of children: 2   Years of education: 18   Highest education level: Some college, no degree  Occupational History   Occupation: Human resources officer    Comment: retired  Tobacco Use   Smoking status: Never   Smokeless tobacco: Never  Vaping Use   Vaping Use: Never used  Substance and Sexual Activity   Alcohol use: No   Drug use: Never   Sexual activity: Not Currently  Other Topics Concern   Not on file  Social History Narrative   Lives alone. She has two children who live close by. Works on the farm still. Enjoys singing.   Social Determinants of Health   Financial Resource Strain: Low Risk  (07/12/2022)   Overall Financial Resource Strain (CARDIA)    Difficulty of Paying Living Expenses: Not hard at all  Food Insecurity: No Food Insecurity (07/12/2022)   Hunger Vital Sign    Worried About Running Out of Food in the Last Year: Never true    Ran Out of Food in the Last Year: Never true  Transportation Needs: No Transportation Needs (07/12/2022)   PRAPARE - Administrator, Civil Service (Medical): No    Lack of Transportation (Non-Medical): No  Physical Activity: Inactive (07/12/2022)   Exercise Vital Sign    Days of Exercise per Week: 0 days    Minutes of Exercise per Session: 0 min  Stress: No Stress Concern Present (07/12/2022)   [employer] of Occupational Health - Occupational Stress Questionnaire    Feeling of Stress : Not at all  Social Connections: Moderately Isolated (07/13/2022)   Social Connection and Isolation Panel [NHANES]    Frequency of Communication with Friends and Family: More than three times a week    Frequency of Social Gatherings with Friends and Family: Once a  week    Attends Religious Services: More than 4 times per year    Active Member of [employer] or Organizations: No    Attends Banker Meetings: Never    Marital Status: Widowed    Activities of Daily Living    07/12/2022    4:22 PM  In your present state of health, do you have any difficulty performing the following activities:  Hearing? 0  Vision? 0  Difficulty concentrating or making decisions? 0  Walking or climbing stairs? 0  Dressing or bathing? 0  Doing errands, shopping? 0  Preparing Food and eating ? N  Using the Toilet? N  In the past six months, have you accidently leaked urine? N  Do you have problems with loss of bowel control? N  Managing your Medications? N  Managing your Finances? N  Housekeeping or managing your Housekeeping? N    Patient Education/ Literacy How often do you need to have someone help you when you read instructions, pamphlets, or other written materials from your doctor or pharmacy?: 1 - Never What is the last grade level you completed in school?: 12th grade and business school  Exercise Current Exercise Habits: Home exercise routine, Type of exercise: stretching, Time (Minutes): 15, Frequency (Times/Week): 7, Weekly Exercise (Minutes/Week): 105, Intensity: Mild, Exercise limited by: orthopedic condition(s)  Diet Patient reports consuming 3 meals a day and 1 snack(s) a day Patient reports that her primary diet is: Regular Patient reports that she does have regular access to food.   Depression Screen    07/13/2022    2:11 PM 05/20/2022   11:57 AM 04/07/2022   10:37 AM 10/05/2021   10:43 AM 04/06/2021    9:40 AM 01/01/2021   10:54 AM 05/01/2019   11:10 AM  PHQ 2/9 Scores  PHQ - 2 Score 0 0 0 1 0 0 1  PHQ- 9 Score    2  0      Fall Risk    07/13/2022    2:11 PM 07/12/2022    4:22 PM 05/20/2022   11:57 AM 04/07/2022   10:36 AM 04/06/2021    9:40 AM  Fall Risk   Falls in the past year? 1 1 1 1  0  Number falls in past yr: 1 1 1 1  0   Injury with Fall? 1 0 0 0 0  Risk for fall due to : History of fall(s)  History of fall(s) No Fall Risks No Fall Risks  Follow up Falls evaluation completed;Education provided;Falls prevention discussed  Falls evaluation completed Falls evaluation completed Falls prevention discussed;Falls evaluation completed     Objective:  Christy Gutierrez seemed alert and oriented and she participated appropriately during our telephone visit.  Blood Pressure Weight BMI  BP Readings from Last 3 Encounters:  05/20/22 134/89  04/07/22 136/77  10/05/21 137/81   Wt Readings from Last 3 Encounters:  05/20/22 141 lb (64 kg)  04/07/22 144 lb (65.3 kg)  10/05/21 148 lb (67.1 kg)   BMI Readings from Last 1 Encounters:  05/20/22 22.76 kg/m    *Unable to obtain current vital signs, weight, and BMI due to telephone visit type  Hearing/Vision  Christy Gutierrez did not seem to have difficulty with hearing/understanding during the telephone conversation Reports that she has had a formal eye exam by an eye care professional within the past year Reports that she has not had a formal hearing evaluation within the past year *Unable to fully assess hearing and vision during telephone visit type  Cognitive Function:    07/13/2022    2:09 PM 05/01/2019   11:15 AM  6CIT Screen  What Year? 0 points 0 points  What month? 0 points 0 points  What time? 0 points 0 points  Count back from 20 0 points 0 points  Months in reverse 0 points 0 points  Repeat phrase 0 points 0 points  Total Score 0 points 0 points   (Normal:0-7, Significant for Dysfunction: >8)  Normal Cognitive Function Screening: Yes   Immunization & Health Maintenance Record Immunization History  Administered Date(s) Administered   Fluad Quad(high Dose 65+) 12/26/2018, 01/01/2021, 04/07/2022   Tdap 05/08/2013    Health Maintenance  Topic Date Due   Pneumonia Vaccine  79+ Years old (1 of 2 - PCV) 04/08/2023 (Originally 09/27/1946)   Zoster Vaccines- Shingrix  (1 of 2) 07/06/2023 (Originally 09/27/1988)   INFLUENZA VACCINE  09/30/2022   DTaP/Tdap/Td (2 - Td or Tdap) 05/09/2023   Medicare Annual Wellness (AWV)  07/13/2023   DEXA SCAN  Completed   HPV VACCINES  Aged Out   COVID-19 Vaccine  Discontinued       Assessment  This is a routine wellness examination for [employer].  Health Maintenance: Due or Overdue There are no preventive care reminders to display for this patient.   Christy Gutierrez does not need a referral for Community Assistance: Care Management:   no Social Work:    no Prescription Assistance:  no Nutrition/Diabetes Education:  no   Plan:  Personalized Goals  Goals Addressed               This Visit's Progress     Patient Stated (pt-stated)        Patient stated that she would like to be able to walk more.       Personalized Health Maintenance & Screening Recommendations  Pneumococcal vaccine  Shingles vaccine Dexa scan  Patient declined the vaccines.   Lung Cancer Screening Recommended: no (Low Dose CT Chest recommended if Age 62-80 years, 20 pack-year currently smoking OR have quit w/in past 15 years) Hepatitis C Screening recommended: no HIV Screening recommended: no  Advanced Directives: Written information was not prepared per patient's request.  Referrals & Orders Orders Placed This Encounter  Procedures   DEXAScan    Follow-up Plan Follow-up with Agapito Games, MD as planned Medicare wellness visit in one year.  Patient will access AVS on my chart.   I have personally reviewed and noted the following in the patient's chart:   Medical and social history Use of alcohol, tobacco or illicit drugs  Current medications and supplements Functional ability and status Nutritional status Physical activity Advanced directives List of other physicians Hospitalizations, surgeries, and ER visits in previous 12 months Vitals Screenings to include cognitive, depression, and falls Referrals  and appointments  In addition, I have reviewed and discussed with Christy Gutierrez certain preventive protocols, quality metrics, and best practice recommendations. A written personalized care plan for preventive services as well as general preventive health recommendations is available and can be mailed to the patient at her request.      Modesto Charon, RN BSN  07/13/2022

## 2022-07-23 ENCOUNTER — Other Ambulatory Visit: Payer: Self-pay | Admitting: Family Medicine

## 2022-07-23 DIAGNOSIS — E781 Pure hyperglyceridemia: Secondary | ICD-10-CM

## 2022-08-12 ENCOUNTER — Telehealth: Payer: Self-pay

## 2022-08-12 NOTE — Telephone Encounter (Signed)
Romesha called and states the dermatologist prescribed Nicotinamide. She was told to ask Dr Linford Arnold if it is safe to take with her other medications.

## 2022-08-17 MED ORDER — NICOTINAMIDE 750-27-2-0.5 MG PO TABS: 1.0000 | ORAL_TABLET | Freq: Every day | ORAL | 0 refills | Status: DC

## 2022-08-17 NOTE — Telephone Encounter (Signed)
Yes, safe 5to take nicotinamide, also known as B3.

## 2022-08-18 NOTE — Telephone Encounter (Signed)
Patient informed. 

## 2022-09-01 ENCOUNTER — Encounter: Payer: Self-pay | Admitting: Family Medicine

## 2022-09-01 ENCOUNTER — Ambulatory Visit (INDEPENDENT_AMBULATORY_CARE_PROVIDER_SITE_OTHER): Payer: Medicare HMO | Admitting: Family Medicine

## 2022-09-01 VITALS — BP 138/72 | HR 54

## 2022-09-01 DIAGNOSIS — I4891 Unspecified atrial fibrillation: Secondary | ICD-10-CM | POA: Diagnosis not present

## 2022-09-01 LAB — CBC WITH DIFFERENTIAL/PLATELET
Basophils Relative: 0.6 %
HCT: 43.2 % (ref 35.0–45.0)
Hemoglobin: 14.6 g/dL (ref 11.7–15.5)
Monocytes Relative: 8.5 %
RBC: 4.67 10*6/uL (ref 3.80–5.10)

## 2022-09-01 MED ORDER — APIXABAN 5 MG PO TABS
5.0000 mg | ORAL_TABLET | Freq: Two times a day (BID) | ORAL | 1 refills | Status: DC
Start: 2022-09-01 — End: 2022-10-06

## 2022-09-01 NOTE — Patient Instructions (Signed)
OK to take Excedrin Tension HA

## 2022-09-01 NOTE — Progress Notes (Signed)
Pt was noted to have a-fib intraoperatively. Her HR was controlled and she was asymptomatic. I was able to schedule an appointment with her primary care physician for tomorrow at 10 AM. I discussed the need to consider medical management for a-fib with her physician to include but not limited to an echocardiogram, anticoagulation and rate controlling medication. I also informed her to discuss the possibilities of cardioversion. Given that the pt is asymptomatic and rate controlled, I think it is reasonable to manage this as an outpt given her appointment is in the morning. I told her to seek immediate medical attention if she feels her heart race, develops SOB or CP. The pt expressed an appropriate understanding.  Electronically signed by Cherre Huger, MD at 08/31/2022 9:08 AM EDT  Back to top of OR Notes

## 2022-09-01 NOTE — Progress Notes (Signed)
   Acute Office Visit  Subjective:     Patient ID: Christy Gutierrez, female    DOB: March 29, 1940, 82 y.o.   MRN: [id number]  Chief Complaint  Patient presents with   Follow-up         HPI Patient is in today for new onset atrial fibrillation.  She had cataract surgery on her right eye yesterday and while they had her on a monitor during the procedure they noticed that she was in atrial fibrillation but reported that she was rate controlled.  This was new and had not been seen previously.  Patient reports that sometimes when she walks outside she will feel a little off balance or just like things are off but has not experienced any discrete shortness of breath or chest pain or lower extremity swelling or tachycardia.    ROS      Objective:    BP 138/72   Pulse (!) 54   SpO2 99%    Physical Exam  No results found for any visits on 09/01/22.      Assessment & Plan:   Problem List Items Addressed This Visit   None Visit Diagnoses     Atrial fibrillation, unspecified type (HCC)    -  Primary   Relevant Medications   apixaban (ELIQUIS) 5 MG TABS tablet   Other Relevant Orders   EKG 12-Lead   ECHOCARDIOGRAM COMPLETE   CBC with Differential/Platelet   TSH   COMPLETE METABOLIC PANEL WITH GFR   Ambulatory referral to Cardiology      Chads Vasc  score of 4, 1 point for hypertension, 2 points for greater than 33 years of age and 1 point for female.  Start Eliquis 5 mg.  She is greater than 80 but BMI is greater than 60 and renal function is normal.  Will get up-to-date echocardiogram.  Will also get labs to rule out underlying causes.  already rate controlled so does not need a beta-blocker.  Will get updated labs just to rule out thyroid dysfunction or electrolyte abnormality.  EKG today shows rate of 88 bpm, irregularly irregular rhythm.  Please see scanned EKG.   Note: She has been having some frequent headaches lately and typically uses Excedrin tension headache and wanted  to make sure it was still safe to take that.  It mostly contains Tylenol and caffeine so okay to take for now.  Meds ordered this encounter  Medications   apixaban (ELIQUIS) 5 MG TABS tablet    Sig: Take 1 tablet (5 mg total) by mouth 2 (two) times daily.    Dispense:  60 tablet    Refill:  1    No follow-ups on file.  Nani Gasser, MD

## 2022-09-02 LAB — CBC WITH DIFFERENTIAL/PLATELET
Absolute Monocytes: 425 cells/uL (ref 200–950)
Basophils Absolute: 30 cells/uL (ref 0–200)
Eosinophils Absolute: 150 cells/uL (ref 15–500)
Eosinophils Relative: 3 %
Lymphs Abs: 1855 cells/uL (ref 850–3900)
MCH: 31.3 pg (ref 27.0–33.0)
MCHC: 33.8 g/dL (ref 32.0–36.0)
MCV: 92.5 fL (ref 80.0–100.0)
MPV: 10.8 fL (ref 7.5–12.5)
Neutro Abs: 2540 cells/uL (ref 1500–7800)
Neutrophils Relative %: 50.8 %
Platelets: 165 10*3/uL (ref 140–400)
RDW: 12.8 % (ref 11.0–15.0)
Total Lymphocyte: 37.1 %
WBC: 5 10*3/uL (ref 3.8–10.8)

## 2022-09-02 LAB — COMPLETE METABOLIC PANEL WITH GFR
AG Ratio: 2.3 (calc) (ref 1.0–2.5)
ALT: 22 U/L (ref 6–29)
AST: 31 U/L (ref 10–35)
Albumin: 4.6 g/dL (ref 3.6–5.1)
Alkaline phosphatase (APISO): 63 U/L (ref 37–153)
BUN: 15 mg/dL (ref 7–25)
CO2: 30 mmol/L (ref 20–32)
Calcium: 9.9 mg/dL (ref 8.6–10.4)
Chloride: 105 mmol/L (ref 98–110)
Creat: 0.73 mg/dL (ref 0.60–0.95)
Globulin: 2 g/dL (calc) (ref 1.9–3.7)
Glucose, Bld: 93 mg/dL (ref 65–99)
Potassium: 5.1 mmol/L (ref 3.5–5.3)
Sodium: 141 mmol/L (ref 135–146)
Total Bilirubin: 0.7 mg/dL (ref 0.2–1.2)
Total Protein: 6.6 g/dL (ref 6.1–8.1)
eGFR: 82 mL/min/{1.73_m2} (ref >=60)

## 2022-09-02 LAB — TSH: TSH: 1.26 mIU/L (ref 0.40–4.50)

## 2022-09-03 NOTE — Progress Notes (Signed)
Christy Gutierrez, your blood count and thyroid look great.  Metabolic panel is also normal.

## 2022-09-08 ENCOUNTER — Other Ambulatory Visit: Payer: Self-pay | Admitting: Family Medicine

## 2022-09-08 DIAGNOSIS — F32A Depression, unspecified: Secondary | ICD-10-CM

## 2022-10-06 ENCOUNTER — Ambulatory Visit (INDEPENDENT_AMBULATORY_CARE_PROVIDER_SITE_OTHER): Payer: Medicare HMO | Admitting: Family Medicine

## 2022-10-06 ENCOUNTER — Encounter: Payer: Self-pay | Admitting: Family Medicine

## 2022-10-06 VITALS — BP 128/68 | HR 88 | Ht 66.0 in | Wt 142.0 lb

## 2022-10-06 DIAGNOSIS — Z23 Encounter for immunization: Secondary | ICD-10-CM | POA: Diagnosis not present

## 2022-10-06 DIAGNOSIS — I10 Essential (primary) hypertension: Secondary | ICD-10-CM

## 2022-10-06 DIAGNOSIS — I4891 Unspecified atrial fibrillation: Secondary | ICD-10-CM | POA: Diagnosis not present

## 2022-10-06 MED ORDER — APIXABAN 5 MG PO TABS: 5.0000 mg | ORAL_TABLET | Freq: Two times a day (BID) | ORAL | 0 refills | Status: DC

## 2022-10-06 NOTE — Assessment & Plan Note (Signed)
Well controlled. Continue current regimen. Follow up in  6

## 2022-10-06 NOTE — Addendum Note (Signed)
Addended by: Nani Gasser D on: 10/06/2022 01:51 PM   Modules accepted: Level of Service

## 2022-10-06 NOTE — Progress Notes (Signed)
   Established Patient Office Visit  Subjective   Patient ID: Christy Gutierrez, female    DOB: May 02, 1940  Age: 82 y.o. MRN: [id number]  Chief Complaint  Patient presents with   Hypertension    HPI Hypertension- Pt denies chest pain, SOB, dizziness, or heart palpitations.  Taking meds as directed w/o problems.  Denies medication side effects.    Follow-up new onset atrial fibrillation she has her echocardiogram scheduled for August 22 and the appointment with the cardiologist is in September.  She is tolerating the Eliquis well but it is quite expensive and so would like it sent to mail order so she can see if she can get a cheaper price on it.  She still asymptomatic with no chest pain palpitations or shortness of breath.    ROS    Objective:     BP 128/68   Pulse 88   Ht 5\' 6"  (1.676 m)   Wt 142 lb (64.4 kg)   SpO2 98%   BMI 22.92 kg/m    Physical Exam Vitals and nursing note reviewed.  Constitutional:      Appearance: She is well-developed.  HENT:     Head: Normocephalic and atraumatic.  Cardiovascular:     Rate and Rhythm: Normal rate. Rhythm irregular.     Heart sounds: Normal heart sounds.  Pulmonary:     Effort: Pulmonary effort is normal.     Breath sounds: Normal breath sounds.  Skin:    General: Skin is warm and dry.  Neurological:     Mental Status: She is alert and oriented to person, place, and time.  Psychiatric:        Behavior: Behavior normal.      No results found for any visits on 10/06/22.    The ASCVD Risk score (Arnett DK, et al., 2019) failed to calculate for the following reasons:   The 2019 ASCVD risk score is only valid for ages 26 to 71    Assessment & Plan:   Problem List Items Addressed This Visit       Cardiovascular and Mediastinum   Essential hypertension - Primary    Well controlled. Continue current regimen. Follow up in  6 .        Relevant Medications   apixaban (ELIQUIS) 5 MG TABS tablet   Atrial fibrillation  (HCC)    Symptomatic.  Continue with Eliquis.  Keep echocardiogram appointment for later this month.  And keep cardiology appointment for September.      Relevant Medications   apixaban (ELIQUIS) 5 MG TABS tablet    Return in about 6 months (around 04/08/2023) for Hypertension.    Nani Gasser, MD

## 2022-10-06 NOTE — Assessment & Plan Note (Signed)
Symptomatic.  Continue with Eliquis.  Keep echocardiogram appointment for later this month.  And keep cardiology appointment for September.

## 2022-10-21 ENCOUNTER — Ambulatory Visit (HOSPITAL_BASED_OUTPATIENT_CLINIC_OR_DEPARTMENT_OTHER)
Admission: RE | Admit: 2022-10-21 | Discharge: 2022-10-21 | Disposition: A | Discharge status: Home / Self Care | Payer: Medicare HMO | Source: Ambulatory Visit | Attending: Family Medicine | Admitting: Family Medicine

## 2022-10-21 DIAGNOSIS — I4891 Unspecified atrial fibrillation: Secondary | ICD-10-CM | POA: Diagnosis present

## 2022-10-21 LAB — ECHOCARDIOGRAM COMPLETE
Area-P 1/2: 4.68 cm2
MV M vel: 5.34 m/s
MV Peak grad: 114.1 mmHg
P 1/2 time: 470 msec
S' Lateral: 3 cm

## 2022-10-26 DIAGNOSIS — Z23 Encounter for immunization: Secondary | ICD-10-CM

## 2022-10-26 NOTE — Progress Notes (Signed)
[place], echocardiogram shows good pumping function of the heart between 60 and 65% which is normal.  The walls are moving normally no sign that there is been any type of injury to the muscle wall.  Is a little bit of backflow on the mitral valve.  And only little bit of mild backflow on the aortic valve.  Is not concerning at this time but we will keep an eye on things.

## 2022-11-08 ENCOUNTER — Ambulatory Visit: Payer: Medicare HMO | Admitting: Cardiology

## 2022-11-10 ENCOUNTER — Other Ambulatory Visit: Payer: Self-pay | Admitting: Family Medicine

## 2022-11-26 ENCOUNTER — Telehealth: Payer: Self-pay | Admitting: Family Medicine

## 2022-11-26 NOTE — Telephone Encounter (Signed)
Patient called in stating that her nose had been bleeding since 5am. It has now stopped as of an hour ago. Please advise

## 2022-11-26 NOTE — Telephone Encounter (Signed)
avoid picking your nose and keep your fingernails short. blow your nose as little as possible and only very gently. keep your home humidified.  If it happens again over the weekend try to pinch the bridge of the nose at the edge of the bone and the soft spot and tilt chin just slightly forward.  She can also get some Afrin over-the-counter and put some of that on a cottonball and put that into the nose and then pinch.  May have to pinch for 5 to 10 minutes to get it to completely stop.  If it continues we can try to get her in and see if there is a spot that might need to be cauterized.

## 2022-11-26 NOTE — Telephone Encounter (Signed)
Patient informed of recommendations  

## 2022-11-29 ENCOUNTER — Ambulatory Visit (INDEPENDENT_AMBULATORY_CARE_PROVIDER_SITE_OTHER): Payer: Medicare HMO | Admitting: Family Medicine

## 2022-11-29 VITALS — BP 152/98 | HR 86 | Ht 66.0 in

## 2022-11-29 DIAGNOSIS — R040 Epistaxis: Secondary | ICD-10-CM

## 2022-11-29 NOTE — Progress Notes (Signed)
   Acute Office Visit  Subjective:     Patient ID: Christy Gutierrez, female    DOB: 1940-07-11, 82 y.o.   MRN: [id number]  Chief Complaint  Patient presents with   Epistaxis    Patient c/o  nasal bleeding x 8:15am -  mostly Right side bridge of nose but occasionally left. some post nasal bleeding will need cough up. Only other episode was Friday 11/26/22.    HPI Patient is in today for   Patient c/o  nasal bleeding x 8:15am -  mostly Right side bridge of nose but occasionally left. some post nasal bleeding will need cough up. Only other episode was Friday 11/26/22.    ROS      Objective:    BP (!) 152/98   Pulse 86   Ht 5\' 6"  (1.676 m)   SpO2 96%   BMI 22.92 kg/m    Physical Exam Vitals reviewed.  Constitutional:      Appearance: Normal appearance.  HENT:     Head: Normocephalic.     Ears:     Comments: Active bleeding from the right nostril.  Used chemical cautery to try to cauterize the mucousa.  Pulmonary:     Effort: Pulmonary effort is normal.  Neurological:     Mental Status: She is alert and oriented to person, place, and time.  Psychiatric:        Mood and Affect: Mood normal.        Behavior: Behavior normal.     No results found for any visits on 11/29/22.      Assessment & Plan:   Problem List Items Addressed This Visit   None Visit Diagnoses     Right-sided nosebleed    -  Primary   Relevant Orders   Ambulatory referral to ENT      We were eventually able to get the nosebleed to stop with pressure, ice and chemical cautery.  She was given some Tylenol here for headache.  She has not taken her morning meds encouraged her to take her blood pressure medicine when she gets home today and skip her Eliquis.  She can restart the Eliquis tomorrow.  And can go ahead and make a referral to ENT since this is her second nosebleed from the right nostril she actually has already stopped the fluticasone she actually stopped it last week.  BP high today.  Recommend take BP med today when gets home. Avoid any strenuous activity or exercise. Can also use ice and Affrin on cotton ball if needed.    No orders of the defined types were placed in this encounter.  I spent 60 minutes on the day of the encounter to include pre-visit record review, face-to-face time with the patient and post visit ordering of test.   No follow-ups on file.  Nani Gasser, MD

## 2023-01-03 ENCOUNTER — Other Ambulatory Visit: Payer: Self-pay | Admitting: Family Medicine

## 2023-01-03 NOTE — Progress Notes (Signed)
Referring-Catherine Metheney MD Reason for referral-atrial fibrillation  HPI: 82 year old female for evaluation of atrial fibrillation at request of Nani Gasser MD.  ABIs November 2023 showed 0.71 on the left and 0.87 on the right.  TSH July 2024 1.26.  Creatinine 0.73.  Hemoglobin 14.6. Echocardiogram August 2024 showed normal LV function, mild left atrial enlargement, mild to moderate mitral regurgitation, mild aortic insufficiency.  Patient recently noted to have atrial fibrillation intraoperatively and cardiology now asked to evaluate.  Patient has dyspnea with more vigorous activities but not routine activities.  No orthopnea, PND, pedal edema, chest pain, palpitations or syncope.  She has noticed over the past 1 year she has less energy but attributes that to age.  She had 2 recent nosebleeds.  Current Outpatient Medications  Medication Sig Dispense Refill   Acetaminophen-Caffeine 500-65 MG TABS Take by mouth.     albuterol (VENTOLIN HFA) 108 (90 Base) MCG/ACT inhaler TAKE 2 PUFFS BY MOUTH EVERY 6 HOURS AS NEEDED FOR WHEEZE OR SHORTNESS OF BREATH 18 g 3   betamethasone valerate ointment (VALISONE) 0.1 % Apply topically daily. 45 g 1   Calcium Carb-Cholecalciferol [phone] MG-UNIT TABS Take by mouth.     diclofenac sodium (VOLTAREN) 1 % GEL Apply 4 g topically 4 (four) times daily. To affected joint. 100 g 11   ELIQUIS 5 MG TABS tablet TAKE 1 TABLET TWICE A DAY 180 tablet 0   escitalopram (LEXAPRO) 20 MG tablet TAKE 1 TABLET DAILY 90 tablet 1   fexofenadine (ALLEGRA) 180 MG tablet Take 1 tablet (180 mg total) by mouth at bedtime. 90 tablet 3   fluticasone (FLONASE) 50 MCG/ACT nasal spray Place 1 spray into both nostrils daily. 16 g 3   lisinopril (ZESTRIL) 5 MG tablet TAKE 1 TABLET DAILY 90 tablet 1   Multiple Vitamins-Minerals (MULTIVITAMIN WITH MINERALS) tablet Take 1 tablet by mouth daily.     Niacinamide-Zn-Cu-Methfo-Se-Cr (NICOTINAMIDE) 750-27-2-0.5 MG TABS Take 1 tablet by  mouth daily. 30 tablet 0   omega-3 acid ethyl esters (LOVAZA) 1 g capsule TAKE 2 CAPSULES TWICE DAILY 360 capsule 3   SUMAtriptan (IMITREX) 100 MG tablet TAKE AS DIRECTED AS NEEDED 27 tablet 1   Tiotropium Bromide Monohydrate (SPIRIVA RESPIMAT) 2.5 MCG/ACT AERS USE 1 INHALATION ORALLY    DAILY 12 g 4   No current facility-administered medications for this visit.    Allergies  Allergen Reactions   Amlodipine Other (See Comments)   Clarithromycin      Past Medical History:  Diagnosis Date   Atrial fibrillation (HCC)    Back pain    Hypertension     Past Surgical History:  Procedure Laterality Date   SKIN CANCER EXCISION  05/2022   on the chest   TONSILLECTOMY     TUBAL LIGATION     bilateral    Social History   Socioeconomic History   Marital status: Widowed    Spouse name: Not on file   Number of children: 2   Years of education: 45   Highest education level: Some college, no degree  Occupational History   Occupation: Human resources officer    Comment: retired  Tobacco Use   Smoking status: Never   Smokeless tobacco: Never  Vaping Use   Vaping status: Never Used  Substance and Sexual Activity   Alcohol use: No   Drug use: Never   Sexual activity: Not Currently  Other Topics Concern   Not on file  Social History Narrative   Lives alone. She  has two children who live close by. Works on the farm still. Enjoys singing.   Social Determinants of Health   Financial Resource Strain: Low Risk  (07/12/2022)   Overall Financial Resource Strain (CARDIA)    Difficulty of Paying Living Expenses: Not hard at all  Food Insecurity: No Food Insecurity (07/12/2022)   Hunger Vital Sign    Worried About Running Out of Food in the Last Year: Never true    Ran Out of Food in the Last Year: Never true  Transportation Needs: No Transportation Needs (07/12/2022)   PRAPARE - Administrator, Civil Service (Medical): No    Lack of Transportation (Non-Medical): No  Physical Activity:  Inactive (07/12/2022)   Exercise Vital Sign    Days of Exercise per Week: 0 days    Minutes of Exercise per Session: 0 min  Stress: No Stress Concern Present (07/12/2022)   [employer] of Occupational Health - Occupational Stress Questionnaire    Feeling of Stress : Not at all  Social Connections: Moderately Isolated (07/13/2022)   Social Connection and Isolation Panel [NHANES]    Frequency of Communication with Friends and Family: More than three times a week    Frequency of Social Gatherings with Friends and Family: Once a week    Attends Religious Services: More than 4 times per year    Active Member of [employer] or Organizations: No    Attends Banker Meetings: Never    Marital Status: Widowed  Intimate Partner Violence: Not At Risk (09/09/2022)   Received from [facility]   HITS    Over the last 12 months how often did your partner physically hurt you?: Never    Over the last 12 months how often did your partner insult you or talk down to you?: Never    Over the last 12 months how often did your partner threaten you with physical harm?: Never    Over the last 12 months how often did your partner scream or curse at you?: Never    Family History  Problem Relation Age of Onset   Alcohol abuse Father    Cancer Brother     ROS: no fevers or chills, productive cough, hemoptysis, dysphasia, odynophagia, melena, hematochezia, dysuria, hematuria, rash, seizure activity, orthopnea, PND, pedal edema, claudication. Remaining systems are negative.  Physical Exam:   Blood pressure 138/82, pulse (!) 103, height 5\' 6"  (1.676 m), weight 144 lb 1.9 oz (65.4 kg), SpO2 98%.  General:  Well developed/well nourished in NAD Skin warm/dry Patient not depressed No peripheral clubbing Back-normal HEENT-normal/normal eyelids Neck supple/normal carotid upstroke bilaterally; no bruits; no JVD; no thyromegaly chest - CTA/ normal expansion CV -irregular and tachycardic/normal S1 and  S2; no murmurs, rubs or gallops;  PMI nondisplaced Abdomen -NT/ND, no HSM, no mass, + bowel sounds, no bruit 2+ femoral pulses, no bruits Ext-no edema, chords, 2+ DP Neuro-grossly nonfocal  EKG Interpretation Date/Time:  Monday January 17 2023 10:42:12 EST Ventricular Rate:  103 PR Interval:    QRS Duration:  76 QT Interval:  314 QTC Calculation: 411 R Axis:   -63  Text Interpretation: Atrial fibrillation with rapid ventricular response Left axis deviation Low voltage QRS Septal infarct , age undetermined No previous ECGs available Confirmed by Olga Millers ([id number]) on 01/17/2023 10:44:19 AM    A/P  1 atrial fibrillation-patient has newly diagnosed atrial fibrillation.  This was diagnosed intraoperatively in July.  She is essentially asymptomatic though does have some fatigue which she  attributes to age.  The duration of her atrial fibrillation therefore is unclear.  Her LV function is normal as is her TSH.  Her embolic risk factors include hypertension, peripheral vascular disease, female sex and age greater than 22.  She will continue on apixaban.  She had 2 recent nosebleeds.  If this becomes an issue in the future will consider referral for watchman.  Her rate is mildly elevated.  Add Toprol 25 mg daily.  We discussed rate control versus rhythm control.  As above it is unclear about the duration of her arrhythmia.  Also not clear that she is having symptoms.  I would like to try to cardiovert her 1 time to see if she would hold sinus rhythm and to see if her fatigue would improve.  However she would prefer conservative measures for now.  I will see her back in 8 to 12 weeks and we will make a final decision concerning this issue.  2 mitral and aortic insufficiency-she will need follow-up echoes in the future.  3 hypertension-patient's blood pressure is controlled.  Continue present medical regimen.  Olga Millers, MD

## 2023-01-17 ENCOUNTER — Encounter: Payer: Self-pay | Admitting: Cardiology

## 2023-01-17 ENCOUNTER — Ambulatory Visit (INDEPENDENT_AMBULATORY_CARE_PROVIDER_SITE_OTHER): Payer: Medicare HMO | Admitting: Cardiology

## 2023-01-17 VITALS — BP 138/82 | HR 103 | Ht 66.0 in | Wt 144.1 lb

## 2023-01-17 DIAGNOSIS — I10 Essential (primary) hypertension: Secondary | ICD-10-CM

## 2023-01-17 DIAGNOSIS — I4891 Unspecified atrial fibrillation: Secondary | ICD-10-CM

## 2023-01-17 MED ORDER — METOPROLOL SUCCINATE ER 25 MG PO TB24: 25.0000 mg | ORAL_TABLET | Freq: Every day | ORAL | 3 refills | Status: DC

## 2023-01-17 NOTE — Patient Instructions (Signed)
Medication Instructions:   START METOPROLOL SUCC ER 25 MG ONCE DAILY AT BEDTIME  *If you need a refill on your cardiac medications before your next appointment, please call your pharmacy*    Follow-Up: At [facility], you and your health needs are our priority.  As part of our continuing mission to provide you with exceptional heart care, we have created designated Provider Care Teams.  These Care Teams include your primary Cardiologist (physician) and Advanced Practice Providers (APPs -  Physician Assistants and Nurse Practitioners) who all work together to provide you with the care you need, when you need it.  We recommend signing up for the patient portal called "MyChart".  Sign up information is provided on this After Visit Summary.  MyChart is used to connect with patients for Virtual Visits (Telemedicine).  Patients are able to view lab/test results, encounter notes, upcoming appointments, etc.  Non-urgent messages can be sent to your provider as well.   To learn more about what you can do with MyChart, go to [email or web address].    Your next appointment:   3 month(s)  Provider:   Olga Millers, MD

## 2023-03-07 ENCOUNTER — Other Ambulatory Visit: Payer: Self-pay | Admitting: Family Medicine

## 2023-03-07 DIAGNOSIS — F32A Depression, unspecified: Secondary | ICD-10-CM

## 2023-03-23 NOTE — Progress Notes (Signed)
HPI: FU atrial fibrillation at request of Nani Gasser MD. ABIs November 2023 showed 0.71 on the left and 0.87 on the right. TSH July 2024 1.26. Echocardiogram August 2024 showed normal LV function, mild left atrial enlargement, mild to moderate mitral regurgitation, mild aortic insufficiency. Patient recently noted to have atrial fibrillation intraoperatively. Since last seen, she denies dyspnea, chest pain, palpitations, syncope or bleeding.  She has noticed some increase in fatigue.  Current Outpatient Medications  Medication Sig Dispense Refill   Acetaminophen-Caffeine 500-65 MG TABS Take by mouth.     albuterol (VENTOLIN HFA) 108 (90 Base) MCG/ACT inhaler TAKE 2 PUFFS BY MOUTH EVERY 6 HOURS AS NEEDED FOR WHEEZE OR SHORTNESS OF BREATH 18 g 3   betamethasone valerate ointment (VALISONE) 0.1 % Apply topically daily. 45 g 1   Calcium Carb-Cholecalciferol [phone] MG-UNIT TABS Take by mouth.     diclofenac sodium (VOLTAREN) 1 % GEL Apply 4 g topically 4 (four) times daily. To affected joint. 100 g 11   ELIQUIS 5 MG TABS tablet TAKE 1 TABLET TWICE A DAY 180 tablet 0   escitalopram (LEXAPRO) 20 MG tablet TAKE 1 TABLET DAILY 90 tablet 1   fexofenadine (ALLEGRA) 180 MG tablet Take 1 tablet (180 mg total) by mouth at bedtime. 90 tablet 3   fluticasone (FLONASE) 50 MCG/ACT nasal spray Place 1 spray into both nostrils daily. 16 g 3   lisinopril (ZESTRIL) 5 MG tablet TAKE 1 TABLET DAILY 90 tablet 1   metoprolol succinate (TOPROL XL) 25 MG 24 hr tablet Take 1 tablet (25 mg total) by mouth at bedtime. 90 tablet 3   Multiple Vitamins-Minerals (MULTIVITAMIN WITH MINERALS) tablet Take 1 tablet by mouth daily.     Niacinamide-Zn-Cu-Methfo-Se-Cr (NICOTINAMIDE) 750-27-2-0.5 MG TABS Take 1 tablet by mouth daily. 30 tablet 0   omega-3 acid ethyl esters (LOVAZA) 1 g capsule TAKE 2 CAPSULES TWICE DAILY 360 capsule 3   SUMAtriptan (IMITREX) 100 MG tablet TAKE AS DIRECTED AS NEEDED 27 tablet 1    Tiotropium Bromide Monohydrate (SPIRIVA RESPIMAT) 2.5 MCG/ACT AERS USE 1 INHALATION ORALLY    DAILY 12 g 4   No current facility-administered medications for this visit.     Past Medical History:  Diagnosis Date   Atrial fibrillation (HCC)    Back pain    Hypertension     Past Surgical History:  Procedure Laterality Date   SKIN CANCER EXCISION  05/2022   on the chest   TONSILLECTOMY     TUBAL LIGATION     bilateral    Social History   Socioeconomic History   Marital status: Widowed    Spouse name: Not on file   Number of children: 2   Years of education: 46   Highest education level: Some college, no degree  Occupational History   Occupation: Human resources officer    Comment: retired  Tobacco Use   Smoking status: Never   Smokeless tobacco: Never  Vaping Use   Vaping status: Never Used  Substance and Sexual Activity   Alcohol use: No   Drug use: Never   Sexual activity: Not Currently  Other Topics Concern   Not on file  Social History Narrative   Lives alone. She has two children who live close by. Works on the farm still. Enjoys singing.   Social Drivers of Corporate investment banker Strain: Low Risk  (07/12/2022)   Overall Financial Resource Strain (CARDIA)    Difficulty of Paying Living Expenses: Not hard at  all  Food Insecurity: No Food Insecurity (07/12/2022)   Hunger Vital Sign    Worried About Running Out of Food in the Last Year: Never true    Ran Out of Food in the Last Year: Never true  Transportation Needs: No Transportation Needs (07/12/2022)   PRAPARE - Administrator, Civil Service (Medical): No    Lack of Transportation (Non-Medical): No  Physical Activity: Inactive (07/12/2022)   Exercise Vital Sign    Days of Exercise per Week: 0 days    Minutes of Exercise per Session: 0 min  Stress: No Stress Concern Present (07/12/2022)   [employer] of Occupational Health - Occupational Stress Questionnaire    Feeling of Stress : Not at all   Social Connections: Moderately Isolated (07/13/2022)   Social Connection and Isolation Panel [NHANES]    Frequency of Communication with Friends and Family: More than three times a week    Frequency of Social Gatherings with Friends and Family: Once a week    Attends Religious Services: More than 4 times per year    Active Member of [employer] or Organizations: No    Attends Banker Meetings: Never    Marital Status: Widowed  Intimate Partner Violence: Not At Risk (09/09/2022)   Received from [facility]   HITS    Over the last 12 months how often did your partner physically hurt you?: Never    Over the last 12 months how often did your partner insult you or talk down to you?: Never    Over the last 12 months how often did your partner threaten you with physical harm?: Never    Over the last 12 months how often did your partner scream or curse at you?: Never    Family History  Problem Relation Age of Onset   Alcohol abuse Father    Cancer Brother     ROS: no fevers or chills, productive cough, hemoptysis, dysphasia, odynophagia, melena, hematochezia, dysuria, hematuria, rash, seizure activity, orthopnea, PND, pedal edema, claudication. Remaining systems are negative.  Physical Exam: Well-developed well-nourished in no acute distress.  Skin is warm and dry.  HEENT is normal.  Neck is supple.  Chest is clear to auscultation with normal expansion.  Cardiovascular exam is irregular Abdominal exam nontender or distended. No masses palpated. Extremities show no edema. neuro grossly intact   A/P  1 atrial fibrillation-this was previously documented intraoperatively July 2024.  The duration of her atrial fibrillation therefore is unknown.  I have recommended attempt at cardioversion but she would prefer to be conservative with rate control.  She understands that the longer she is in atrial fibrillation the less likely sinus rhythm will be restored in the future.  She does  have some fatigue.  She attributes this to age.  It could also be related to her atrial fibrillation which I have explained.  There may also be a contribution from Toprol.  I will discontinue Toprol and instead treat with Cardizem CD 120 mg daily.  Continue apixaban.  2 history of aortic and mitral regurgitation-patient will require follow-up echo August 2025.  3 hypertension-patient's blood pressure is elevated; will follow and adjust regimen as needed.  Olga Millers, MD

## 2023-04-04 ENCOUNTER — Ambulatory Visit (INDEPENDENT_AMBULATORY_CARE_PROVIDER_SITE_OTHER): Payer: Medicare HMO | Admitting: Cardiology

## 2023-04-04 ENCOUNTER — Encounter: Payer: Self-pay | Admitting: Cardiology

## 2023-04-04 VITALS — BP 154/92 | HR 92 | Ht 66.0 in | Wt 146.0 lb

## 2023-04-04 DIAGNOSIS — I10 Essential (primary) hypertension: Secondary | ICD-10-CM

## 2023-04-04 DIAGNOSIS — I340 Nonrheumatic mitral (valve) insufficiency: Secondary | ICD-10-CM

## 2023-04-04 DIAGNOSIS — I4891 Unspecified atrial fibrillation: Secondary | ICD-10-CM | POA: Diagnosis not present

## 2023-04-04 MED ORDER — DILTIAZEM HCL ER COATED BEADS 120 MG PO CP24
120.0000 mg | ORAL_CAPSULE | Freq: Every day | ORAL | 3 refills | Status: DC
Start: 2023-04-04 — End: 2023-06-24

## 2023-04-04 NOTE — Patient Instructions (Signed)
Medication Instructions:   STOP METOPROLOL  START DILTIAZEM CD 120 MG ONCE DAILY  *If you need a refill on your cardiac medications before your next appointment, please call your pharmacy*   Follow-Up: At [facility], you and your health needs are our priority.  As part of our continuing mission to provide you with exceptional heart care, we have created designated Provider Care Teams.  These Care Teams include your primary Cardiologist (physician) and Advanced Practice Providers (APPs -  Physician Assistants and Nurse Practitioners) who all work together to provide you with the care you need, when you need it.  We recommend signing up for the patient portal called "MyChart".  Sign up information is provided on this After Visit Summary.  MyChart is used to connect with patients for Virtual Visits (Telemedicine).  Patients are able to view lab/test results, encounter notes, upcoming appointments, etc.  Non-urgent messages can be sent to your provider as well.   To learn more about what you can do with MyChart, go to [email or web address].    Your next appointment:   6 month(s)  Provider:   Olga Millers, MD

## 2023-04-05 ENCOUNTER — Other Ambulatory Visit: Payer: Self-pay | Admitting: Cardiology

## 2023-04-13 ENCOUNTER — Ambulatory Visit: Payer: Medicare HMO

## 2023-04-13 ENCOUNTER — Ambulatory Visit (INDEPENDENT_AMBULATORY_CARE_PROVIDER_SITE_OTHER): Payer: Medicare HMO | Admitting: Family Medicine

## 2023-04-13 ENCOUNTER — Encounter: Payer: Self-pay | Admitting: Family Medicine

## 2023-04-13 VITALS — BP 130/62 | HR 80 | Ht 66.0 in | Wt 145.0 lb

## 2023-04-13 DIAGNOSIS — M47812 Spondylosis without myelopathy or radiculopathy, cervical region: Secondary | ICD-10-CM | POA: Diagnosis not present

## 2023-04-13 DIAGNOSIS — M50223 Other cervical disc displacement at C6-C7 level: Secondary | ICD-10-CM

## 2023-04-13 DIAGNOSIS — I517 Cardiomegaly: Secondary | ICD-10-CM

## 2023-04-13 DIAGNOSIS — M542 Cervicalgia: Secondary | ICD-10-CM | POA: Diagnosis not present

## 2023-04-13 DIAGNOSIS — R059 Cough, unspecified: Secondary | ICD-10-CM | POA: Diagnosis not present

## 2023-04-13 DIAGNOSIS — M50222 Other cervical disc displacement at C5-C6 level: Secondary | ICD-10-CM

## 2023-04-13 DIAGNOSIS — R052 Subacute cough: Secondary | ICD-10-CM

## 2023-04-13 MED ORDER — TIZANIDINE HCL 4 MG PO TABS: 4.0000 mg | ORAL_TABLET | Freq: Three times a day (TID) | ORAL | 0 refills | Status: DC | PRN

## 2023-04-13 MED ORDER — PREDNISONE 20 MG PO TABS: 40.0000 mg | ORAL_TABLET | Freq: Every day | ORAL | 0 refills | Status: DC

## 2023-04-13 NOTE — Addendum Note (Signed)
Addended by: Nani Gasser D on: 04/13/2023 02:32 PM   Modules accepted: Level of Service

## 2023-04-13 NOTE — Progress Notes (Signed)
Established Patient Office Visit  Subjective  Patient ID: Christy Gutierrez, female    DOB: 1940/04/15  Age: 83 y.o. MRN: [id number]  Chief Complaint  Patient presents with   Hypertension    HPI  Here for blood pressure follow-up.  She recently saw cardiology, Dr. Jens Som on February 3.  She is preferred conservative treatment for rate control for her A-fib.  She had complained about some fatigue to him so he switched her Toprol to Cardizem CD120 and encouraged her to continue the apixaban.  Her blood pressure that day was in the 150s even though previously it had been in the 130s in November.  She says that she has had a lot of pain on the left side of her neck posteriorly that is been going on for several months at this point.  She says almost 30 years ago back in 1992 she was having severe neck pain that was triggering her migraines.  It was bad enough that she was even out of work for a little while she ended up getting injections and eventually it got better and she really has not had a lot of issues with it since then but over the last several months she has had a lot of pain particularly on that left side she says it is worse if she tries to exercise so it has been impacting her walking she will have to stop and rest she has been taking Excedrin almost every day for the pain which usually ramps up in the afternoons.  She notes decreased range of motion.  She also reports that she has been struggling with a cough since December.  It is mostly dry but occasionally she will get mucus up out of her chest.  She is also had some postnasal drip and drainage and some mild nasal congestion she says that she has not otherwise felt bad she has not had bodyaches fevers chills or GI upset.    ROS    Objective:     BP 136/64   Pulse 80   Ht 5\' 6"  (1.676 m)   Wt 145 lb (65.8 kg)   SpO2 96%   BMI 23.40 kg/m    Physical Exam Vitals and nursing note reviewed.  Constitutional:      Appearance:  Normal appearance.  HENT:     Head: Normocephalic and atraumatic.  Eyes:     Conjunctiva/sclera: Conjunctivae normal.  Cardiovascular:     Rate and Rhythm: Normal rate. Rhythm irregular.  Pulmonary:     Effort: Pulmonary effort is normal.     Breath sounds: Wheezing and rhonchi present.     Comments: bilat Musculoskeletal:     Comments: She has normal flexion and significantly decreased extension of the cervical spine decreased rotation right and left and significant pain with rotation right and left.  Nontender over the cervical spine directly.  She is tender just laterally to the spine on the left closer to the base of the skull in particular  Skin:    General: Skin is warm and dry.  Neurological:     Mental Status: She is alert.  Psychiatric:        Mood and Affect: Mood normal.      No results found for any visits on 04/13/23.    The ASCVD Risk score (Arnett DK, et al., 2019) failed to calculate for the following reasons:   The 2019 ASCVD risk score is only valid for ages 63 to 53    Assessment &  Plan:   Problem List Items Addressed This Visit   None Visit Diagnoses       Neck pain on left side    -  Primary   Relevant Medications   predniSONE (DELTASONE) 20 MG tablet   tiZANidine (ZANAFLEX) 4 MG tablet   Other Relevant Orders   DG Cervical Spine Complete     Subacute cough       Relevant Medications   predniSONE (DELTASONE) 20 MG tablet   tiZANidine (ZANAFLEX) 4 MG tablet   Other Relevant Orders   DG Chest 2 View       Left-sided neck pain-I think it is more myofascial.  We discussed maybe doing a trial with a muscle relaxer and getting a plain film just to rule out any significant abnormality in the spine I think she would benefit greatly from formal physical therapy and probably some dry needling if possible.  Cough greater than 6 weeks with congestion I do hear rhonchi and some wheezing in her chest will get plain film for further workup today she does  not really feel bad overall just more the annoying cough.  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  No follow-ups on file.    Nani Gasser, MD

## 2023-04-14 ENCOUNTER — Telehealth: Payer: Self-pay | Admitting: Cardiology

## 2023-04-14 NOTE — Telephone Encounter (Signed)
Pt c/o medication issue:  1. Name of Medication:   diltiazem (CARDIZEM CD) 120 MG 24 hr capsule   2. How are you currently taking this medication (dosage and times per day)?  Not taking yet  3. Are you having a reaction (difficulty breathing--STAT)?   4. What is your medication issue?   Patient stated he pharmacy ([facility] - [place], [place] - [facility]) wants confirmation that her metoprolol succinate (TOPROL XL) 25 MG 24 hr tablet [[id number]]   medication has been discontinued before they will fill patient's Diltiazem medication.

## 2023-04-14 NOTE — Telephone Encounter (Signed)
Called pharmacy to clarify Metoprolol was discontinued at last appt. Medication as dc/ from Medication list on our end. Spoke with staff at [facility]. She was able to discontinue Metoprolol. Cardizem will cost pt $3.62

## 2023-04-29 ENCOUNTER — Encounter: Payer: Self-pay | Admitting: Family Medicine

## 2023-04-29 NOTE — Progress Notes (Signed)
 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  There is a little bit of shifting of C2 on top of C3 and C4 on top of C5.  So another words those are slightly out of alignment.  I would highly recommend formal physical therapy at this point to help reduce pain and improve mobility and motion to allow you to exercise again.  We have a great PT department here if that would work well but if there is something closer to where you live then please let us know and I am happy to place referral.

## 2023-04-29 NOTE — Progress Notes (Signed)
 Hi Christy Gutierrez, great news chest x-ray looks clear no sign of infection or pneumonia or significant bronchial inflammation which is great.  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

## 2023-05-02 ENCOUNTER — Telehealth: Payer: Self-pay

## 2023-05-02 ENCOUNTER — Other Ambulatory Visit: Payer: Self-pay

## 2023-05-02 ENCOUNTER — Ambulatory Visit: Payer: Self-pay | Admitting: Family Medicine

## 2023-05-02 DIAGNOSIS — I482 Chronic atrial fibrillation, unspecified: Secondary | ICD-10-CM

## 2023-05-02 MED ORDER — APIXABAN 5 MG PO TABS: 5.0000 mg | ORAL_TABLET | Freq: Two times a day (BID) | ORAL | 1 refills | Status: DC

## 2023-05-02 MED ORDER — APIXABAN 5 MG PO TABS: 5.0000 mg | ORAL_TABLET | Freq: Two times a day (BID) | ORAL | 0 refills | Status: DC

## 2023-05-02 NOTE — Telephone Encounter (Signed)
  Chief Complaint: medication refill  Additional Notes: Patient states the pharmacy gave her this phone # for office to contact: [phone]. Patient only has 2 doses remaining for tonight and tomorrow morning. [facility], if needed please [facility] a few days [facility]  [facility] [address] [facility] [place] (so that patient does not run out) to get her thru til the [facility] fill the prescription. Called [facility] and notified staff.  Copied from CRM [id number]. Topic: Clinical - Red Word Triage >> May 02, 2023 10:31 AM Geroge Baseman wrote: Red Word that prompted transfer to Nurse Triage: Out of eloquis, worried about side effects Reason for Disposition  [1] Prescription refill request for ESSENTIAL medicine (i.e., likelihood of harm to patient if not taken) AND [2] triager unable to refill per department policy  Answer Assessment - Initial Assessment Questions 1. DRUG NAME: "What medicine do you need to have refilled?"     Eliquis.  2. REFILLS REMAINING: "How many refills are remaining?" (Note: The label on the medicine or pill bottle will show how many refills are remaining. If there are no refills remaining, then a renewal may be needed.)     None.  3. EXPIRATION DATE: "What is the expiration date?" (Note: The label states when the prescription will expire, and thus can no longer be refilled.)     01/04/24.  4. PRESCRIBING HCP: "Who prescribed it?" Reason: If prescribed by specialist, call should be referred to that group.     Dr Linford Arnold.  5. SYMPTOMS: "Do you have any symptoms?"     Denies any symptoms.  Protocols used: Medication Refill and Renewal Call-A-AH

## 2023-05-02 NOTE — Telephone Encounter (Signed)
 Eliquis refill sent today 3/3/5 by Jola Babinski

## 2023-05-02 NOTE — Telephone Encounter (Signed)
 Task completed. E2C2 Triage nurse informed that a 30 day supply was sent over to the local [facility]) with a note to the pharmacy "emergency - rx delayed with m/o pharmacy".   A secondary rx for 90 d/s sent to mail order pharmacy. Patient was informed during the call.

## 2023-05-02 NOTE — Telephone Encounter (Signed)
 Copied from CRM [id number]. Topic: Clinical - Prescription Issue >> May 02, 2023 10:30 AM Geroge Baseman wrote: Reason for CRM: [phone] this number given patient to call back to get refill of ELIQUIS 5 MG TABS tablet

## 2023-05-30 ENCOUNTER — Telehealth: Payer: Self-pay

## 2023-05-30 NOTE — Telephone Encounter (Signed)
 Copied from CRM #[id number]. Topic: Clinical - Prescription Issue >> May 30, 2023  8:51 AM Irine Seal wrote: Reason for CRM: omega-3 acid ethyl esters (LOVAZA) 1 g capsule- prior authorization   Patient stated that pharmacy: [facility] [place], [place] - [facility] AT Portal to Registered [facility] Sites [address] [place], [place] [address]   Patient provided contact information for [facility]: [phone] (Option 2) and fax: [phone] for prior authorization submission. Requesting provider assistance in initiating PA for Omega-3 Acid Ethyl Esters (LOVAZA) 1g capsules, mail order, 90-day supply.

## 2023-06-01 ENCOUNTER — Telehealth: Payer: Self-pay

## 2023-06-01 ENCOUNTER — Other Ambulatory Visit (HOSPITAL_COMMUNITY): Payer: Self-pay

## 2023-06-01 MED ORDER — ICOSAPENT ETHYL 1 G PO CAPS: 2.0000 g | ORAL_CAPSULE | Freq: Two times a day (BID) | ORAL | 3 refills | Status: AC

## 2023-06-01 NOTE — Telephone Encounter (Signed)
 Spoke with patient  State Lovaza 1G is requiring PA and refills  States she was given phone number with [facility] to call for PA  [phone] opt #2   For refill: last written 07/23/22 Last OV 04/13/2023 Upcoming appt 07/19/2023 AWV Last Lipid panel 10/05/2021

## 2023-06-01 NOTE — Telephone Encounter (Signed)
 Per test claim:  Generic Vascepa is preferred by the insurance.  If suggested medication is appropriate, Please send in a new RX and discontinue this one. If not, please advise as to why it's not appropriate so that we may request a Prior Authorization. Please note, some preferred medications may still require a PA.  If the suggested medications have not been trialed and there are no contraindications to their use, the PA will not be submitted, as it will not be approved.

## 2023-06-01 NOTE — Telephone Encounter (Signed)
 Meds ordered this encounter  Medications   icosapent Ethyl (VASCEPA) 1 g capsule    Sig: Take 2 capsules (2 g total) by mouth 2 (two) times daily.    Dispense:  360 capsule    Refill:  3

## 2023-06-01 NOTE — Addendum Note (Signed)
 Addended by: Nani Gasser D on: 06/01/2023 06:27 PM   Modules accepted: Orders

## 2023-06-01 NOTE — Telephone Encounter (Signed)
 Plesae forward these to PA team if meds are needing a PA

## 2023-06-01 NOTE — Telephone Encounter (Signed)
 Pharmacy Patient Advocate Encounter   Received notification from Pt Calls Messages that prior authorization for Omega 3 ethyl esthers (Lovaza) is required/requested.   Insurance verification completed.   The patient is insured through [employer] .   Per test claim:  Icosapent Ethyl 1 g capsule (Vascepa) is preferred by the insurance.  If suggested medication is appropriate, Please send in a new RX and discontinue this one. If not, please advise as to why it's not appropriate so that we may request a Prior Authorization. Please note, some preferred medications may still require a PA.  If the suggested medications have not been trialed and there are no contraindications to their use, the PA will not be submitted, as it will not be approved.  If medication is switched to Icosapent Ethyl 1 g capsules:  Per test claim: The current 30 day co-pay is, $4.36.  No PA needed at this time. This test claim was processed through [facility]- copay amounts may vary at other pharmacies due to pharmacy/plan contracts, or as the patient moves through the different stages of their insurance plan.

## 2023-06-01 NOTE — Telephone Encounter (Signed)
 Copied from CRM [id number]. Topic: Clinical - Prescription Issue >> Jun 01, 2023 10:25 AM Yvone Neu wrote: Reason for CRM: Patient called and would like to speak with Dr. Linford Arnold nurse about a rx, patient callback number [phone].

## 2023-06-02 NOTE — Telephone Encounter (Signed)
 Patient informed of medication change

## 2023-06-05 ENCOUNTER — Other Ambulatory Visit: Payer: Self-pay | Admitting: Family Medicine

## 2023-06-24 ENCOUNTER — Telehealth: Payer: Self-pay | Admitting: Cardiology

## 2023-06-24 MED ORDER — DILTIAZEM HCL ER COATED BEADS 120 MG PO CP24: 120.0000 mg | ORAL_CAPSULE | Freq: Every day | ORAL | 3 refills | Status: DC

## 2023-06-24 NOTE — Addendum Note (Signed)
 Addended by: Jerilyn Gillaspie on: 06/24/2023 05:54 PM   Modules accepted: Orders

## 2023-06-24 NOTE — Telephone Encounter (Signed)
 Pt c/o medication issue:  1. Name of Medication: metoprolol  25mg   2. How are you currently taking this medication (dosage and times per day)? Taking 25mg  1 a day   3. Are you having a reaction (difficulty breathing--STAT)? No  4. What is your medication issue? Pt requesting cb  to discuss change

## 2023-06-24 NOTE — Telephone Encounter (Signed)
 Returned call to patient.  She had never received the diltiazem  120 mg from [facility] in Feb, after we called to let them know the metoprolol  was discontinued, so she just kept taking the metoprolol  that she had at home.  Now she is out.  The change in med per last ov note from Dr. Audery Blazing was due to her fatigue that she started noticing when metoprolol  was started.  I went ahead and resent the prescription of diltiazem  120 mg - one tablet daily, noting on script that metoprolol  is discontinued.  She will follow up with [facility] re: this.

## 2023-06-27 ENCOUNTER — Telehealth: Payer: Self-pay | Admitting: Cardiology

## 2023-06-27 MED ORDER — DILTIAZEM HCL ER COATED BEADS 120 MG PO CP24: 120.0000 mg | ORAL_CAPSULE | Freq: Every day | ORAL | 3 refills | Status: DC

## 2023-06-27 NOTE — Telephone Encounter (Signed)
 Spoke with pt, Refill sent to the pharmacy electronically. Spoke with [facility], they are concerned because the patient has an allergy to amlodipine  and is prescribed diltiazem . Aware to continue order.

## 2023-06-27 NOTE — Telephone Encounter (Signed)
 Pt c/o medication issue:  1. Name of Medication:   diltiazem  (CARDIZEM  CD) 120 MG 24 hr capsule   2. How are you currently taking this medication (dosage and times per day)?   Not taking yet  3. Are you having a reaction (difficulty breathing--STAT)?   4. What is your medication issue?   Patient stated she wants prescription for this medication sent to [facility] - [place], [place] - [facility] AT Portal to [facility] as she has not received this medication as yet.  Patient provided number for [facility] to call [phone] and wants a call back to confirm.

## 2023-06-27 NOTE — Telephone Encounter (Signed)
 Called patient back about message. She is not sure what was suppose to be ordered, but gave number to pharmacy [phone] to call. Will send message to Dr. Lourdes Roy nurse to follow-up.

## 2023-06-30 MED ORDER — DILTIAZEM HCL ER COATED BEADS 120 MG PO CP24: 120.0000 mg | ORAL_CAPSULE | Freq: Every day | ORAL | 3 refills | Status: DC

## 2023-06-30 NOTE — Telephone Encounter (Signed)
*  STAT* If patient is at the pharmacy, call can be transferred to refill team.   1. Which medications need to be refilled? (please list name of each medication and dose if known) diltiazem  (CARDIZEM  CD) 120 MG 24 hr capsule   2. Which pharmacy/location (including street and city if local pharmacy) is medication to be sent to?  [facility] - [place], [place] - [address] [facility] AT Portal to Registered [facility] Sites    3. Do they need a 30 day or 90 day supply? 90  Pharmacy havent receive the prescription. Please advise

## 2023-06-30 NOTE — Telephone Encounter (Signed)
 Pt's medication was resent to pt's pharmacy as requested. Confirmation received.

## 2023-06-30 NOTE — Addendum Note (Signed)
 Addended by: Gayleen Kawasaki D on: 06/30/2023 10:15 AM   Modules accepted: Orders

## 2023-07-04 ENCOUNTER — Other Ambulatory Visit: Payer: Self-pay

## 2023-07-04 MED ORDER — LISINOPRIL 5 MG PO TABS: 5.0000 mg | ORAL_TABLET | Freq: Every day | ORAL | 0 refills | Status: DC

## 2023-07-04 NOTE — Telephone Encounter (Signed)
 Copied from CRM [id number]. Topic: Clinical - Medication Question >> Jul 04, 2023  1:50 PM Christy Gutierrez wrote: Reason for CRM: lisinopril  (ZESTRIL ) 5 MG tablet-Patient has [facility] day coming [facility] but has only 3 pills left. Pharmacy instructed for her to call hr PCP to extended her prescription to hold her off till she gets her 90 day. Needs call back on this.  [phone] >> Jul 04, 2023  1:53 PM Christy Gutierrez wrote: [phone] [place], [place] - [address]  [address]  [place] [address]  Phone: [phone] Fax: [phone]  Hours: Not open 24 hours

## 2023-07-04 NOTE — Telephone Encounter (Signed)
 O.k. to send a 30 day supply to local pharmacy until 90day supply arrives?

## 2023-07-05 NOTE — Telephone Encounter (Signed)
 Called and left a detailed voice mail message on patient home #

## 2023-07-19 ENCOUNTER — Ambulatory Visit (INDEPENDENT_AMBULATORY_CARE_PROVIDER_SITE_OTHER): Payer: Medicare HMO

## 2023-07-19 VITALS — Ht 66.0 in | Wt 146.0 lb

## 2023-07-19 DIAGNOSIS — Z0000 Encounter for general adult medical examination without abnormal findings: Secondary | ICD-10-CM | POA: Diagnosis not present

## 2023-07-19 NOTE — Progress Notes (Signed)
 Subjective:   Christy Gutierrez is a 83 y.o. female who presents for Medicare Annual (Subsequent) preventive examination.  Visit Complete: Virtual I connected with  [place] [facility] on 07/19/23 by a audio enabled telemedicine application and verified that I am speaking with the correct person using two identifiers.  Patient Location: Home  Provider Location: Office/Clinic  I discussed the limitations of evaluation and management by telemedicine. The patient expressed understanding and agreed to proceed.  Vital Signs: Because this visit was a virtual/telehealth visit, some criteria may be missing or patient reported. Any vitals not documented were not able to be obtained and vitals that have been documented are patient reported.  Patient Medicare AWV questionnaire was completed by the patient on n/a; I have confirmed that all information answered by patient is correct and no changes since this date.  Cardiac Risk Factors include: advanced age (>59men, >37 women);hypertension     Objective:    Today's Vitals   07/19/23 1352  Weight: 146 lb (66.2 kg)  Height: 5\' 6"  (1.676 m)   Body mass index is 23.57 kg/m.     07/19/2023    2:09 PM 07/13/2022    2:04 PM 05/01/2019   11:06 AM 04/21/2016    9:32 AM  Advanced Directives  Does Patient Have a Medical Advance Directive? Yes Yes Yes Yes  Type of Estate agent of [place];Living will Living will Healthcare Power of [place];Living will Healthcare Power of [place];Living will  Does patient want to make changes to medical advance directive? No - Patient declined No - Patient declined No - Patient declined   Copy of Healthcare Power of Attorney in Chart? No - copy requested  No - copy requested     Current Medications (verified) Outpatient Encounter Medications as of 07/19/2023  Medication Sig   Acetaminophen-Caffeine 500-65 MG TABS Take by mouth.   albuterol  (VENTOLIN  HFA) 108 (90 Base) MCG/ACT inhaler TAKE 2 PUFFS BY MOUTH  EVERY 6 HOURS AS NEEDED FOR WHEEZE OR SHORTNESS OF BREATH   apixaban  (ELIQUIS ) 5 MG TABS tablet Take 1 tablet (5 mg total) by mouth 2 (two) times daily. Emergency - there is a delayed w/ m/o pharmacy.   betamethasone  valerate ointment (VALISONE ) 0.1 % Apply topically daily.   co-enzyme Q-10 30 MG capsule Take 30 mg by mouth 3 (three) times daily.   diclofenac  sodium (VOLTAREN ) 1 % GEL Apply 4 g topically 4 (four) times daily. To affected joint.   diltiazem  (CARDIZEM  CD) 120 MG 24 hr capsule Take 1 capsule (120 mg total) by mouth daily.   escitalopram  (LEXAPRO ) 20 MG tablet TAKE 1 TABLET DAILY   fexofenadine  (ALLEGRA ) 180 MG tablet Take 1 tablet (180 mg total) by mouth at bedtime.   fluticasone  (FLONASE ) 50 MCG/ACT nasal spray Place 1 spray into both nostrils daily.   icosapent  Ethyl (VASCEPA ) 1 g capsule Take 2 capsules (2 g total) by mouth 2 (two) times daily.   lisinopril  (ZESTRIL ) 5 MG tablet Take 1 tablet (5 mg total) by mouth daily.   Multiple Vitamins-Minerals (MULTIVITAMIN WITH MINERALS) tablet Take 1 tablet by mouth daily.   SPIRIVA  RESPIMAT 2.5 MCG/ACT AERS USE 1 INHALATION ORALLY    DAILY   Calcium Carb-Cholecalciferol [phone] MG-UNIT TABS Take by mouth. (Patient not taking: Reported on 07/19/2023)   SUMAtriptan  (IMITREX ) 100 MG tablet TAKE AS DIRECTED AS NEEDED (Patient not taking: Reported on 07/19/2023)   tiZANidine  (ZANAFLEX ) 4 MG tablet Take 1 tablet (4 mg total) by mouth every 8 (eight) hours  as needed for muscle spasms. (Patient not taking: Reported on 07/19/2023)   [DISCONTINUED] predniSONE  (DELTASONE ) 20 MG tablet Take 2 tablets (40 mg total) by mouth daily with breakfast.   No facility-administered encounter medications on file as of 07/19/2023.    Allergies (verified) Amlodipine  and Clarithromycin   History: Past Medical History:  Diagnosis Date   Atrial fibrillation (HCC)    Back pain    Hypertension    Past Surgical History:  Procedure Laterality Date   SKIN  CANCER EXCISION  05/2022   on the chest   TONSILLECTOMY     TUBAL LIGATION     bilateral   Family History  Problem Relation Age of Onset   Alcohol abuse Father    Cancer Brother    Social History   Socioeconomic History   Marital status: Widowed    Spouse name: Not on file   Number of children: 2   Years of education: 85   Highest education level: 12th grade  Occupational History   Occupation: Human resources officer    Comment: retired  Tobacco Use   Smoking status: Never   Smokeless tobacco: Never  Vaping Use   Vaping status: Never Used  Substance and Sexual Activity   Alcohol use: No   Drug use: Never   Sexual activity: Not Currently  Other Topics Concern   Not on file  Social History Narrative   Lives alone. She has two children who live close by. Works on the farm still.   Social Drivers of Corporate investment banker Strain: Low Risk  (07/19/2023)   Overall Financial Resource Strain (CARDIA)    Difficulty of Paying Living Expenses: Not hard at all  Food Insecurity: No Food Insecurity (07/19/2023)   Hunger Vital Sign    Worried About Running Out of Food in the Last Year: Never true    Ran Out of Food in the Last Year: Never true  Transportation Needs: No Transportation Needs (07/19/2023)   PRAPARE - Administrator, Civil Service (Medical): No    Lack of Transportation (Non-Medical): No  Physical Activity: Insufficiently Active (07/19/2023)   Exercise Vital Sign    Days of Exercise per Week: 7 days    Minutes of Exercise per Session: 20 min  Stress: No Stress Concern Present (07/19/2023)   [employer] of Occupational Health - Occupational Stress Questionnaire    Feeling of Stress : Not at all  Social Connections: Moderately Integrated (07/19/2023)   Social Connection and Isolation Panel [NHANES]    Frequency of Communication with Friends and Family: More than three times a week    Frequency of Social Gatherings with Friends and Family: Three times a week     Attends Religious Services: More than 4 times per year    Active Member of Clubs or Organizations: Yes    Attends Banker Meetings: More than 4 times per year    Marital Status: Widowed    Tobacco Counseling Counseling given: Not Answered   Clinical Intake:  Pre-visit preparation completed: Yes  Pain : No/denies pain     BMI - recorded: 23.57 Nutritional Status: BMI of 19-24  Normal Nutritional Risks: None Diabetes: No  How often do you need to have someone help you when you read instructions, pamphlets, or other written materials from your doctor or pharmacy?: 1 - Never What is the last grade level you completed in school?: 14  Interpreter Needed?: No      Activities of Daily Living  07/19/2023    1:54 PM  In your present state of health, do you have any difficulty performing the following activities:  Hearing? 0  Vision? 0  Difficulty concentrating or making decisions? 0  Walking or climbing stairs? 1  Dressing or bathing? 0  Doing errands, shopping? 0  Preparing Food and eating ? N  Using the Toilet? N  In the past six months, have you accidently leaked urine? N  Do you have problems with loss of bowel control? N  Managing your Medications? N  Managing your Finances? N  Housekeeping or managing your Housekeeping? N    Patient Care Team: Cydney Draft, MD as PCP - General (Family Medicine) Susie Erichsen, MD as Referring Physician (Ophthalmology) Audery Blazing Deannie Fabian, MD as Consulting Physician (Cardiology) Leticia Raven, OD (Optometry)  Indicate any recent Medical Services you may have received from other than [facility] providers in the past year (date may be approximate).     Assessment:   This is a routine wellness examination for Christy Gutierrez.  Hearing/Vision screen No results found.   Goals Addressed             This Visit's Progress    Patient Stated       Patient stated she would like to continue with her current healthy  lifestyle.       Depression Screen    07/19/2023    2:06 PM 04/13/2023   11:22 AM 07/13/2022    2:11 PM 05/20/2022   11:57 AM 04/07/2022   10:37 AM 10/05/2021   10:43 AM 04/06/2021    9:40 AM  PHQ 2/9 Scores  PHQ - 2 Score 1 0 0 0 0 1 0  PHQ- 9 Score  2    2     Fall Risk    07/19/2023    2:09 PM 07/13/2022    2:11 PM 07/12/2022    4:22 PM 05/20/2022   11:57 AM 04/07/2022   10:36 AM  Fall Risk   Falls in the past year? 1 1 1 1 1   Number falls in past yr: 0 1 1 1 1   Injury with Fall? 0 1 0 0 0  Risk for fall due to : Impaired balance/gait History of fall(s)  History of fall(s) No Fall Risks  Follow up Falls evaluation completed Falls evaluation completed;Education provided;Falls prevention discussed  Falls evaluation completed Falls evaluation completed    MEDICARE RISK AT HOME: Medicare Risk at Home Any stairs in or around the home?: Yes If so, are there any without handrails?: Yes Home free of loose throw rugs in walkways, pet beds, electrical cords, etc?: Yes Adequate lighting in your home to reduce risk of falls?: Yes Life alert?: No Use of a cane, walker or w/c?: No Grab bars in the bathroom?: No Shower chair or bench in shower?: Yes Elevated toilet seat or a handicapped toilet?: No  TIMED UP AND GO:  Was the test performed?  No    Cognitive Function:        07/19/2023    2:12 PM 07/13/2022    2:09 PM 05/01/2019   11:15 AM  6CIT Screen  What Year? 0 points 0 points 0 points  What month? 0 points 0 points 0 points  What time? 0 points 0 points 0 points  Count back from 20 0 points 0 points 0 points  Months in reverse 0 points 0 points 0 points  Repeat phrase 0 points 0 points 0 points  Total Score 0  points 0 points 0 points    Immunizations Immunization History  Administered Date(s) Administered   Fluad Quad(high Dose 65+) 12/26/2018, 01/01/2021, 04/07/2022   Fluad Trivalent(High Dose 65+) 10/26/2022   Tdap 05/08/2013    TDAP status: Due, Education has been  provided regarding the importance of this vaccine. Advised may receive this vaccine at local pharmacy or Health Dept. Aware to provide a copy of the vaccination record if obtained from local pharmacy or Health Dept. Verbalized acceptance and understanding.  Flu Vaccine status: Up to date  Pneumococcal vaccine status: Due, Education has been provided regarding the importance of this vaccine. Advised may receive this vaccine at local pharmacy or Health Dept. Aware to provide a copy of the vaccination record if obtained from local pharmacy or Health Dept. Verbalized acceptance and understanding.  Covid-19 vaccine status: Declined, Education has been provided regarding the importance of this vaccine but patient still declined. Advised may receive this vaccine at local pharmacy or Health Dept.or vaccine clinic. Aware to provide a copy of the vaccination record if obtained from local pharmacy or Health Dept. Verbalized acceptance and understanding.  Qualifies for Shingles Vaccine? Yes   Zostavax completed No   Shingrix Completed?: No.    Education has been provided regarding the importance of this vaccine. Patient has been advised to call insurance company to determine out of pocket expense if they have not yet received this vaccine. Advised may also receive vaccine at local pharmacy or Health Dept. Verbalized acceptance and understanding.  Screening Tests Health Maintenance  Topic Date Due   Pneumonia Vaccine 3+ Years old (1 of 2 - PCV) Never done   Zoster Vaccines- Shingrix (1 of 2) Never done   DTaP/Tdap/Td (2 - Td or Tdap) 05/09/2023   INFLUENZA VACCINE  09/30/2023   Medicare Annual Wellness (AWV)  07/18/2024   DEXA SCAN  Completed   HPV VACCINES  Aged Out   Meningococcal B Vaccine  Aged Out   COVID-19 Vaccine  Discontinued    Health Maintenance  Health Maintenance Due  Topic Date Due   Pneumonia Vaccine 65+ Years old (1 of 2 - PCV) Never done   Zoster Vaccines- Shingrix (1 of 2) Never  done   DTaP/Tdap/Td (2 - Td or Tdap) 05/09/2023    Colorectal cancer screening: No longer required.   Mammogram status: No longer required due to age.  Bone Density status: Completed 06/13/2013. Results reflect: Bone density results: OSTEOPOROSIS. Repeat every 2 years.Patient declined.   Lung Cancer Screening: (Low Dose CT Chest recommended if Age 32-80 years, 20 pack-year currently smoking OR have quit w/in 15years.) does not qualify.   Lung Cancer Screening Referral: N/a  Additional Screening:  Hepatitis C Screening: does not qualify; Completed   Vision Screening: Recommended annual ophthalmology exams for early detection of glaucoma and other disorders of the eye. Is the patient up to date with their annual eye exam?  Yes  Who is the provider or what is the name of the office in which the patient attends annual eye exams? Dr Margie Sheller If pt is not established with a provider, would they like to be referred to a provider to establish care? N/a.   Dental Screening: Recommended annual dental exams for proper oral hygiene   Community Resource Referral / Chronic Care Management: CRR required this visit?  No   CCM required this visit?  No     Plan:     I have personally reviewed and noted the following in the patient's chart:  Medical and social history Use of alcohol, tobacco or illicit drugs  Current medications and supplements including opioid prescriptions. Patient is not currently taking opioid prescriptions. Functional ability and status Nutritional status Physical activity Advanced directives List of other physicians Hospitalizations, surgeries, and ER visits in previous 12 months. None Vitals Screenings to include cognitive, depression, and falls Referrals and appointments  In addition, I have reviewed and discussed with patient certain preventive protocols, quality metrics, and best practice recommendations. A written personalized care plan for preventive  services as well as general preventive health recommendations were provided to patient.     Christy Gutierrez, Christy Gutierrez   07/19/2023   After Visit Summary: (MyChart) Due to this being a telephonic visit, the after visit summary with patients personalized plan was offered to patient via MyChart   Nurse Notes:      Christy Gutierrez is a 83 y.o. female patient of Metheney, Christy Diego, MD who had a Medicare Annual Wellness Visit today via telephone. Christy Gutierrez is Retired and lives alone. She has 2 children. she reports that she is socially active and does interact with friends/family regularly. She is moderately physically active and enjoys spending time with family.

## 2023-07-19 NOTE — Patient Instructions (Signed)
  Ms. Hendrie , Thank you for taking time to come for your Medicare Wellness Visit. I appreciate your ongoing commitment to your health goals. Please review the following plan we discussed and let me know if I can assist you in the future.   These are the goals we discussed:  Goals       Patient Stated (pt-stated)      Patient stated that she would like to be able to walk more.      Patient Stated      Patient stated she would like to continue with her current healthy lifestyle.       Weight (lb) < 200 lb (90.7 kg) (pt-stated)      Would like to maintain her weight just would like to get rid of the "belly"        This is a list of the screening recommended for you and due dates:  Health Maintenance  Topic Date Due   Pneumonia Vaccine (1 of 2 - PCV) Never done   Zoster (Shingles) Vaccine (1 of 2) Never done   DTaP/Tdap/Td vaccine (2 - Td or Tdap) 05/09/2023   Flu Shot  09/30/2023   Medicare Annual Wellness Visit  07/18/2024   DEXA scan (bone density measurement)  Completed   HPV Vaccine  Aged Out   Meningitis B Vaccine  Aged Out   COVID-19 Vaccine  Discontinued

## 2023-07-27 ENCOUNTER — Other Ambulatory Visit: Payer: Self-pay | Admitting: Family Medicine

## 2023-08-08 ENCOUNTER — Other Ambulatory Visit: Payer: Self-pay | Admitting: Family Medicine

## 2023-08-08 DIAGNOSIS — F419 Anxiety disorder, unspecified: Secondary | ICD-10-CM

## 2023-08-08 NOTE — Telephone Encounter (Signed)
 Patient scheduled for 09/07/23, thanks.

## 2023-08-08 NOTE — Telephone Encounter (Signed)
 Pls contact the pt to schedule appt for Mood with Dr. Greer Leak. Sending 90 Rx refill due to mail order pharmacy.

## 2023-09-07 ENCOUNTER — Ambulatory Visit (INDEPENDENT_AMBULATORY_CARE_PROVIDER_SITE_OTHER): Admitting: Family Medicine

## 2023-09-07 ENCOUNTER — Ambulatory Visit

## 2023-09-07 VITALS — BP 128/84 | HR 88 | Ht 66.0 in | Wt 145.0 lb

## 2023-09-07 DIAGNOSIS — M545 Low back pain, unspecified: Secondary | ICD-10-CM

## 2023-09-07 DIAGNOSIS — F330 Major depressive disorder, recurrent, mild: Secondary | ICD-10-CM

## 2023-09-07 DIAGNOSIS — I10 Essential (primary) hypertension: Secondary | ICD-10-CM | POA: Diagnosis not present

## 2023-09-07 DIAGNOSIS — R1031 Right lower quadrant pain: Secondary | ICD-10-CM

## 2023-09-07 DIAGNOSIS — D692 Other nonthrombocytopenic purpura: Secondary | ICD-10-CM

## 2023-09-07 DIAGNOSIS — J439 Emphysema, unspecified: Secondary | ICD-10-CM

## 2023-09-07 DIAGNOSIS — S32020A Wedge compression fracture of second lumbar vertebra, initial encounter for closed fracture: Secondary | ICD-10-CM

## 2023-09-07 MED ORDER — KETOROLAC TROMETHAMINE 60 MG/2ML IM SOLN
60.0000 mg | Freq: Once | INTRAMUSCULAR | Status: AC
  Administered 2023-09-07: 60 mg via INTRAMUSCULAR

## 2023-09-07 NOTE — Progress Notes (Addendum)
 Established Patient Office Visit  Subjective  Patient ID: Christy Gutierrez, female    DOB: Aug 31, 1940  Age: 83 y.o. MRN: [id number]  Chief Complaint  Patient presents with   Medical Management of Chronic Issues   Fall    HPI  She recently experienced a fall on May 28th.  She said she was in the shower and the curtain fell while she was trying to open it and then just fell forward she hit the right posterior side of her head she bumped her right arm and tore the skin.  Initially she did not think she had injured her back but about 2 weeks later she mowed her lawn and rode the riding lawnmower for quite a ways and then afterwards started having low back pain.  Has been particularly worse on the right and literally radiating around to the right iliac crest and even into the right lower quadrant of her abdomen.  She has had a little bit of pelvic pressure as well.  She says it is a little reminiscent of when she had a kidney stone but not quite this severe pressure sensation in the groin that she had before.  She reports that she has been having normal bowel movements.  Here for blood pressure follow-up as well.  Taking medications regularly.  For follow-up mood-she denies feeling down or depressed and is otherwise happy with her current regimen of Lexapro  20 mg.    ROS    Objective:     BP 128/84   Pulse 88   Ht 5' 6 (1.676 m)   Wt 145 lb (65.8 kg)   SpO2 99%   BMI 23.40 kg/m    Physical Exam Abdominal:     General: Bowel sounds are normal.     Palpations: Abdomen is soft.     Tenderness: There is no abdominal tenderness.  Musculoskeletal:     Comments: She is very tender over the right SI joint.  Nontender over the lumbar spine directly.  Gait is normal.  Skin:    Comments: Does have an abrasion on her right forearm towards her elbow.  And she has a Band-Aid over the open wound.      Results for orders placed or performed in visit on 09/07/23  CBC  Result Value Ref Range    WBC 6.5 3.4 - 10.8 x10E3/uL   RBC 4.40 3.77 - 5.28 x10E6/uL   Hemoglobin 14.1 11.1 - 15.9 g/dL   Hematocrit 57.4 65.9 - 46.6 %   MCV 97 79 - 97 fL   MCH 32.0 26.6 - 33.0 pg   MCHC 33.2 31.5 - 35.7 g/dL   RDW 87.5 88.2 - 84.5 %   Platelets 187 150 - 450 x10E3/uL  Comprehensive metabolic panel with GFR  Result Value Ref Range   Glucose 81 70 - 99 mg/dL   BUN 15 8 - 27 mg/dL   Creatinine, Ser 9.26 0.57 - 1.00 mg/dL   eGFR 81 >40 fO/fpw/8.26   BUN/Creatinine Ratio 21 12 - 28   Sodium 140 134 - 144 mmol/L   Potassium 4.3 3.5 - 5.2 mmol/L   Chloride 102 96 - 106 mmol/L   CO2 22 20 - 29 mmol/L   Calcium 9.6 8.7 - 10.3 mg/dL   Total Protein 6.9 6.0 - 8.5 g/dL   Albumin 4.5 3.7 - 4.7 g/dL   Globulin, Total 2.4 1.5 - 4.5 g/dL   Bilirubin Total 0.5 0.0 - 1.2 mg/dL   Alkaline Phosphatase 124 (H) 44 -  121 IU/L   AST 44 (H) 0 - 40 IU/L   ALT 30 0 - 32 IU/L  Lipid panel  Result Value Ref Range   Cholesterol, Total 187 100 - 199 mg/dL   Triglycerides 48 0 - 149 mg/dL   HDL 77 >60 mg/dL   VLDL Cholesterol Cal 9 5 - 40 mg/dL   LDL Chol Calc (NIH) 898 (H) 0 - 99 mg/dL   Chol/HDL Ratio 2.4 0.0 - 4.4 ratio      The ASCVD Risk score (Arnett DK, et al., 2019) failed to calculate for the following reasons:   The 2019 ASCVD risk score is only valid for ages 16 to 64    Assessment & Plan:   Problem List Items Addressed This Visit       Cardiovascular and Mediastinum   Senile purpura (HCC)   In addition to the abrasion on her right forearm she has multiple smaller bruises consistent with senile purpura.      Essential hypertension - Primary   Pressure is well-controlled today.      Relevant Orders   CBC (Completed)   Comprehensive metabolic panel with GFR (Completed)   Lipid panel (Completed)     Respiratory   Emphysema lung (HCC)   On Spiriva .,  No recent flares or exacerbations she has been doing well in that regard.        Other   MDD (major depressive disorder),  recurrent episode (HCC)   PHQ 9 socre of 7, but no on first 2 question. GAD 7 score of 1.  Continue current regimen.       Backache   We discussed getting further workup with a lumbar x-ray.  Unclear if it is related to the fall or not since it was 2 weeks out and did occur after riding her lawn more which may have just jarred her back with movement and vibration.  We did discuss getting a lumbar film for further workup since she is also having some concomitant right lower quadrant pain even though bowels are moving normally and like to start with a KUB.  Given Toradol  injection here for more acute pain relief.      Relevant Orders   DG Lumbar Spine Complete (Completed)   Other Visit Diagnoses       Right lower quadrant abdominal pain       Relevant Orders   DG Abd 1 View (Completed)     Closed compression fracture of L2 lumbar vertebra, initial encounter (HCC)           X-ray confirmed L2 compression fx.  She is having 10/10 pain in the mornings and has significant pain with coughing and transitioning sit<>stand.   I think a lumbar brace would benefit her to give her support as her back is healing.    Recommend DonJoy Isoform lumbar orthosis.   Return in about 6 months (around 03/09/2024) for Mood, Hypertension.    I spent 40 minutes on the day of the encounter to include pre-visit record review, face-to-face time with the patient and post visit ordering of test.   [name], MD

## 2023-09-07 NOTE — Assessment & Plan Note (Signed)
 PHQ 9 socre of 7, but no on first 2 question. GAD 7 score of 1.  Continue current regimen.

## 2023-09-07 NOTE — Assessment & Plan Note (Signed)
 On Spiriva .,  No recent flares or exacerbations she has been doing well in that regard.

## 2023-09-07 NOTE — Progress Notes (Unsigned)
 Pt reports that she fell on 5/28 while she was getting out of the shower she hit the back of her head and R arm. She stated that she did not have any LOC   About 2 weeks later she noticed that her R arm started to bruise and when she was mowing her lawn

## 2023-09-08 ENCOUNTER — Encounter: Payer: Self-pay | Admitting: Family Medicine

## 2023-09-08 ENCOUNTER — Ambulatory Visit: Payer: Self-pay | Admitting: Family Medicine

## 2023-09-08 DIAGNOSIS — D692 Other nonthrombocytopenic purpura: Secondary | ICD-10-CM | POA: Insufficient documentation

## 2023-09-08 DIAGNOSIS — S32020A Wedge compression fracture of second lumbar vertebra, initial encounter for closed fracture: Secondary | ICD-10-CM

## 2023-09-08 LAB — CBC
Hematocrit: 42.5 % (ref 34.0–46.6)
Hemoglobin: 14.1 g/dL (ref 11.1–15.9)
MCH: 32 pg (ref 26.6–33.0)
MCHC: 33.2 g/dL (ref 31.5–35.7)
MCV: 97 fL (ref 79–97)
Platelets: 187 x10E3/uL (ref 150–450)
RBC: 4.4 x10E6/uL (ref 3.77–5.28)
RDW: 12.4 % (ref 11.7–15.4)
WBC: 6.5 x10E3/uL (ref 3.4–10.8)

## 2023-09-08 LAB — COMPREHENSIVE METABOLIC PANEL WITH GFR
ALT: 30 IU/L (ref 0–32)
AST: 44 IU/L — ABNORMAL HIGH (ref 0–40)
Albumin: 4.5 g/dL (ref 3.7–4.7)
Alkaline Phosphatase: 124 IU/L — ABNORMAL HIGH (ref 44–121)
BUN/Creatinine Ratio: 21 (ref 12–28)
BUN: 15 mg/dL (ref 8–27)
Bilirubin Total: 0.5 mg/dL (ref 0.0–1.2)
CO2: 22 mmol/L (ref 20–29)
Calcium: 9.6 mg/dL (ref 8.7–10.3)
Chloride: 102 mmol/L (ref 96–106)
Creatinine, Ser: 0.73 mg/dL (ref 0.57–1.00)
Globulin, Total: 2.4 g/dL (ref 1.5–4.5)
Glucose: 81 mg/dL (ref 70–99)
Potassium: 4.3 mmol/L (ref 3.5–5.2)
Sodium: 140 mmol/L (ref 134–144)
Total Protein: 6.9 g/dL (ref 6.0–8.5)
eGFR: 81 mL/min/1.73 (ref >59)

## 2023-09-08 LAB — LIPID PANEL
Chol/HDL Ratio: 2.4 ratio (ref 0.0–4.4)
Cholesterol, Total: 187 mg/dL (ref 100–199)
HDL: 77 mg/dL (ref >39)
LDL Chol Calc (NIH): 101 mg/dL — ABNORMAL HIGH (ref 0–99)
Triglycerides: 48 mg/dL (ref 0–149)
VLDL Cholesterol Cal: 9 mg/dL (ref 5–40)

## 2023-09-08 NOTE — Progress Notes (Signed)
 Hi Christy Gutierrez, metabolic panel shows that your alkaline phosphatase which is a liver enzyme is up just slightly not in a worrisome range just off by 3 points but your AST which is also liver is enzymes up just slightly I want to recheck these labs in 3 to 4 weeks just to make sure that they go back down.  They have typically been normal in the past.  Your LDL cholesterol is only off by 1 point so just continue to eat healthy.  Blood counts normal.

## 2023-09-08 NOTE — Assessment & Plan Note (Signed)
Pressure is well controlled today

## 2023-09-08 NOTE — Assessment & Plan Note (Signed)
 We discussed getting further workup with a lumbar x-ray.  Unclear if it is related to the fall or not since it was 2 weeks out and did occur after riding her lawn more which may have just jarred her back with movement and vibration.  We did discuss getting a lumbar film for further workup since she is also having some concomitant right lower quadrant pain even though bowels are moving normally and like to start with a KUB.  Given Toradol  injection here for more acute pain relief.

## 2023-09-08 NOTE — Assessment & Plan Note (Signed)
 In addition to the abrasion on her right forearm she has multiple smaller bruises consistent with senile purpura.

## 2023-09-12 ENCOUNTER — Telehealth: Payer: Self-pay | Admitting: Family Medicine

## 2023-09-12 NOTE — Progress Notes (Signed)
 Hi Christy Gutierrez, x-ray of the abdomen does confirm constipation.  I think that is been creating a lot of the pressure sensation that you have been experiencing.  I would recommend starting to use MiraLAX 1 capful powder mixed with 6 ounces of water nightly until you have a soft most loose bowel movement.  You can also increase the MiraLAX to twice a day if needed.

## 2023-09-12 NOTE — Telephone Encounter (Signed)
 On Call notification:  Lumbar spine x-ray report.  New acute compression deformity of L2.   Called patient and advised that she has a new compression fracture of L2.  She is currently taking two Excedrin in the morning when the pain is the worst.  She can increase this to BID for pain control.  Speak to provider because she is on Eliquis  and Excedrin contains aspirin.  Also vertebroplasty may be an option.

## 2023-09-12 NOTE — Progress Notes (Signed)
 Hi Christy Gutierrez, x-ray of your low back shows a new compression fracture at L2 they think it is new.  An old 1 at L1.  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  The fracture certainly could have happened when you fell.  I had like to get you into formal physical therapy.  Are you still having a lot of pain or has it gradually gotten a little better?

## 2023-09-13 MED ORDER — CALCITONIN (SALMON) 200 UNIT/ACT NA SOLN: 1.0000 | Freq: Every day | NASAL | 1 refills | Status: DC

## 2023-09-13 NOTE — Telephone Encounter (Signed)
 Physical therapy referral placed.  Of also going to order a bone density test for her to do as well.

## 2023-09-13 NOTE — Addendum Note (Signed)
 Addended by: Marne Meline D on: 09/13/2023 04:12 PM   Modules accepted: Orders

## 2023-09-14 ENCOUNTER — Other Ambulatory Visit: Payer: Self-pay

## 2023-09-14 ENCOUNTER — Encounter: Payer: Self-pay | Admitting: Rehabilitative and Restorative Service Providers"

## 2023-09-14 ENCOUNTER — Telehealth: Payer: Self-pay | Admitting: Rehabilitative and Restorative Service Providers"

## 2023-09-14 ENCOUNTER — Ambulatory Visit: Attending: Family Medicine | Admitting: Rehabilitative and Restorative Service Providers"

## 2023-09-14 DIAGNOSIS — S32020A Wedge compression fracture of second lumbar vertebra, initial encounter for closed fracture: Secondary | ICD-10-CM | POA: Diagnosis not present

## 2023-09-14 DIAGNOSIS — M5459 Other low back pain: Secondary | ICD-10-CM | POA: Insufficient documentation

## 2023-09-14 DIAGNOSIS — M6281 Muscle weakness (generalized): Secondary | ICD-10-CM | POA: Insufficient documentation

## 2023-09-14 NOTE — Telephone Encounter (Addendum)
 Dr. Alvan (cc Arnulfo Pastor) I evaluated Christy Gutierrez today due to recent L2 compression fx.  She is having 10/10 pain in the mornings and has significant pain with coughing and transitioning sit<>stand.  I think a lumbar brace would benefit her to give her support as her back is healing. We walked down to the office because Sam (PT) let me know [facility] does keep some braces there.  Arnulfo reached out to [place] from [employer], but she was not available to come fit the patient. I followed up with Aleck and she recommends a DonJoy Isoform lumbar orthosis. She told me it would go through the [employer] process in your office and that she can help Arnulfo get the right brace for her.  She told me that a recent MD note would need to be addended to justify the need for a lumbar brace.   I want to ensure I don't need to do anything further to facilitate the patient getting scheduled to return to primary care for a brace fitting.  Thank you, Tawni

## 2023-09-14 NOTE — Therapy (Signed)
 OUTPATIENT PHYSICAL THERAPY THORACOLUMBAR EVALUATION  Patient Name: Christy Gutierrez MRN: [id number] DOB:1940/06/14, 83 y.o., female Today's Date: 09/14/2023  END OF SESSION:  PT End of Session - 09/14/23 1406     Visit Number 1    Number of Visits 16    Date for PT Re-Evaluation 11/13/23    Authorization Type aetna medicare    PT Start Time 1407    PT Stop Time 1447    PT Time Calculation (min) 40 min    Activity Tolerance Patient tolerated treatment well    Behavior During Therapy WFL for tasks assessed/performed          Past Medical History:  Diagnosis Date   Atrial fibrillation (HCC)    Back pain    Hypertension    Past Surgical History:  Procedure Laterality Date   SKIN CANCER EXCISION  05/2022   on the chest   TONSILLECTOMY     TUBAL LIGATION     bilateral   Patient Active Problem List   Diagnosis Date Noted   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   Atrial fibrillation (HCC) 10/06/2022   Near syncope 09/03/2020   Pulmonary nodule, right 06/26/2019   MDD (major depressive disorder), recurrent episode (HCC) 12/26/2018   Emphysema lung (HCC) 03/28/2018   Episodic lightheadedness 08/08/2017   Essential hypertension 03/05/2016   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   Left knee pain 04/02/2015   Migraine 05/08/2013   Osteoporosis 11/10/2009   Vitamin D  deficiency 11/04/2009   Hyperlipidemia 11/04/2009   Backache 11/04/2009   FATIGUE 11/04/2009   Asymptomatic postmenopausal status 11/04/2009    PCP: Alvan Craven MD REFERRING PROVIDER: Alvan Craven MD REFERRING DIAG:  Diagnosis  S32.020A (ICD-10-CM) - Compression fracture of L2 vertebra, initial encounter (HCC)   Rationale for Evaluation and Treatment: Rehabilitation  THERAPY DIAG:  Other low back pain  Muscle weakness (generalized)  ONSET DATE: 09/13/23   SUBJECTIVE:                                                                                                                                                                                           SUBJECTIVE STATEMENT: The patient reports that she had a fall on May 28 and hit her head and bumped her arm. 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 She is having significant pain that is constant in nature. She cannot get down on the floor (she would get down to pray or exercise prior to this pain) and notes difficulty trying to get up.   PERTINENT HISTORY:  H/o back  pain from early 90s. A-fib, emphysema, migraine, HTN.  PAIN:  Are you having pain? Yes: NPRS scale: sitting down it is a 5/10, goes up to 10/10 Pain location: low back Pain description: soreness, gets to be sharp pain and muscle spasms Aggravating factors: getting up after sitting Relieving factors: heating pad,   PRECAUTIONS: Back *precautions due to acute compression fx and osteoporosis  RED FLAGS: Compression fracture: Yes: fall on May 28 with pain beginning 2 weeks post fall   Bowel and bladder: denies issues Denies saddle paresthesias  WEIGHT BEARING RESTRICTIONS: No  FALLS:  Has patient fallen in last 6 months? Yes. Number of falls 1 time in May; she describes 2-3 over the past year  LIVING ENVIRONMENT: Lives with: lives alone Lives in: House/apartment Stairs: Yes: Internal: 12 to basement, 4-5 going into home;  steps; one rail Has following equipment at home: None  PLOF: Independent  PATIENT GOALS: reduce, walk upright, return to doing exercises  OBJECTIVE:  Note: Objective measures were completed at Evaluation unless otherwise noted.  DIAGNOSTIC FINDINGS:  IMPRESSION: 1. New mild compression deformity of L2, possibly acute. 2. Stable L1 compression deformity. 3. Moderate multilevel degenerative change.   PATIENT SURVEYS:  Modified Oswestry: 54%  COGNITION: Overall cognitive status: Within functional limits for tasks assessed     SENSATION: WFL  POSTURE: No  Significant postural limitations  PALPATION: None today-- due to   LUMBAR ROM: Deferred due to recent compression fx AROM eval  Flexion   Extension   Right lateral flexion   Left lateral flexion   Right rotation   Left rotation    (Blank rows = not tested)  LOWER EXTREMITY ROM:   WFLs Active  Right eval Left eval  Hip flexion    Hip extension    Hip abduction    Hip adduction    Hip internal rotation    Hip external rotation    Knee flexion    Knee extension    Ankle dorsiflexion    Ankle plantarflexion    Ankle inversion    Ankle eversion     (Blank rows = not tested)  LOWER EXTREMITY MMT:  deferred due to pain-- able to do SLR  MMT Right eval Left eval  Hip flexion    Hip extension    Hip abduction    Hip adduction    Hip internal rotation    Hip external rotation    Knee flexion    Knee extension    Ankle dorsiflexion    Ankle plantarflexion    Ankle inversion    Ankle eversion     (Blank rows = not tested)  LUMBAR SPECIAL TESTS:  Deferred due to compression fx  FUNCTIONAL TESTS:  Log roll for bed mobility instructed and performed  GAIT: Distance walked: 20/13.16 seconds=1.52 ft/sec Assistive device utilized: None Level of assistance: Complete Independence Comments: slowed pace Pain after walking when she first sits down  [facility] Adult PT Treatment:                                                DATE: 09/14/23 Therapeutic Exercise: See HEP   PATIENT EDUCATION:  Education details: HEP Person educated: Patient Education method: Programmer, multimedia, Demonstration, and Handouts Education comprehension: verbalized understanding, returned demonstration, and needs further education  HOME EXERCISE PROGRAM: Access Code: [email or web address] URL: [email or web address] Date: 09/14/2023 Prepared by:  Tawni Ferrier  Program Notes THESE SHOULD NOT INCREASE PAIN. If so, wait until you return to therapy.  Exercises - Supine Active Straight Leg Raise  - 1  x daily - 5 x weekly - 1 sets - 10 reps - Heel Raises with Counter Support  - 1 x daily - 5 x weekly - 1 sets - 10 reps  ASSESSMENT: CLINICAL IMPRESSION: Patient is an 83  y.o. female who was seen today for physical therapy evaluation and treatment for compression fx and low back pain. Her pain varies from 5/10 up to 10/10 and is typically worse in the morning x first hour of waking, coughing, and after sitting and transitioning to stand. Evaluation was limited due to high irritability and precautions due to fx. She presents with impairments in ROM, strength, pain, and overall functional mobility. PT recommends lumbar brace due to characteristics of pain during evaluation (winces with coughing and sit<>stand)-- will reach out to request order.  OBJECTIVE IMPAIRMENTS: decreased mobility, difficulty walking, decreased ROM, decreased strength, increased fascial restrictions, increased muscle spasms, impaired flexibility, postural dysfunction, and pain.   ACTIVITY LIMITATIONS: lifting, bending, standing, stairs, transfers, and locomotion level  PARTICIPATION LIMITATIONS: meal prep, cleaning, laundry, shopping, community activity, and yard work  PERSONAL FACTORS: 3+ comorbidities: osteoporosis, a-fib, HTN are also affecting patient's functional outcome.   REHAB POTENTIAL: Good  CLINICAL DECISION MAKING: Unstable/unpredictable  EVALUATION COMPLEXITY: High   GOALS: Goals reviewed with patient? Yes  SHORT TERM GOALS: Target date: 10/14/23   The patient will be indep with initial HEP Baseline: initiated at eval Goal status: INITIAL  2.  The patient will report pain at worst 6/10 Baseline:  10/10 Goal status: INITIAL  LONG TERM GOALS: Target date: 11/14/23   The patient will be indep with HEP progression. Baseline:  Initiated at eval Goal status: INITIAL  2.  The patient will improve modified oswestry score by 12% Baseline: see above Goal status: INITIAL  3.  The patient will improve  gait speed to 2.3 ft/sec Baseline: 1.52 ft/sec Goal status: INITIAL  4.  The patient will improve pain to < or equal to 2/10 at rest. Baseline:  5-6/10 Goal status: INITIAL PLAN:  PT FREQUENCY: 2x/week  PT DURATION: 8 weeks  PLANNED INTERVENTIONS: 97164- PT Re-evaluation, 97750- Physical Performance Testing, 97110-Therapeutic exercises, 97530- Therapeutic activity, [id number]- Neuromuscular re-education, 97535- Self Care, [id number]- Manual therapy, [id number]- Gait training, [phone]- Aquatic Therapy, Patient/Family education, Balance training, Cryotherapy, and Moist heat.  PLAN FOR NEXT SESSION: bracing at neutral, LE strengthening to tolerance, progress to patient tolerance, gait training, return to functional tasks with modifications and education re: osteoporosis.    Ireland Chagnon, PT 09/14/2023, 3:19 PM

## 2023-09-15 NOTE — Telephone Encounter (Signed)
 Since neither Karla nor myself have seen this patient I am going to defer this to Dr. Alvan and Tonya.

## 2023-09-16 NOTE — Telephone Encounter (Signed)
 LVM advising pt to call back to schedule a NV for back brace fitting and to p/u handicap placard.

## 2023-09-16 NOTE — Telephone Encounter (Signed)
 Yes, she can come by and we can fit her for the brace.  Not sure if you already called her about this.  I know you and I chatted yesterday about it

## 2023-09-19 ENCOUNTER — Ambulatory Visit (INDEPENDENT_AMBULATORY_CARE_PROVIDER_SITE_OTHER): Admitting: Sports Medicine

## 2023-09-19 ENCOUNTER — Ambulatory Visit

## 2023-09-19 VITALS — BP 133/83 | HR 81 | Ht 66.0 in

## 2023-09-19 DIAGNOSIS — G8929 Other chronic pain: Secondary | ICD-10-CM | POA: Diagnosis not present

## 2023-09-19 DIAGNOSIS — M5459 Other low back pain: Secondary | ICD-10-CM | POA: Diagnosis not present

## 2023-09-19 DIAGNOSIS — M5441 Lumbago with sciatica, right side: Secondary | ICD-10-CM | POA: Diagnosis not present

## 2023-09-19 DIAGNOSIS — M47816 Spondylosis without myelopathy or radiculopathy, lumbar region: Secondary | ICD-10-CM | POA: Diagnosis not present

## 2023-09-19 DIAGNOSIS — M6281 Muscle weakness (generalized): Secondary | ICD-10-CM

## 2023-09-19 MED ORDER — ACETAMINOPHEN ER 650 MG PO TBCR: 650.0000 mg | EXTENDED_RELEASE_TABLET | Freq: Three times a day (TID) | ORAL | Status: DC | PRN

## 2023-09-19 NOTE — Therapy (Signed)
 OUTPATIENT PHYSICAL THERAPY THORACOLUMBAR TREATMENT  Patient Name: Christy Gutierrez MRN: [id number] DOB:07-Dec-1940, 83 y.o., female Today's Date: 09/19/2023  END OF SESSION:  PT End of Session - 09/19/23 1101     Visit Number 2    Number of Visits 16    Date for PT Re-Evaluation 11/13/23    Authorization Type aetna medicare    PT Start Time 1102    PT Stop Time 1145    PT Time Calculation (min) 43 min    Activity Tolerance Patient tolerated treatment well    Behavior During Therapy WFL for tasks assessed/performed          Past Medical History:  Diagnosis Date   Atrial fibrillation (HCC)    Back pain    Hypertension    Past Surgical History:  Procedure Laterality Date   SKIN CANCER EXCISION  05/2022   on the chest   TONSILLECTOMY     TUBAL LIGATION     bilateral   Patient Active Problem List   Diagnosis Date Noted   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   Atrial fibrillation (HCC) 10/06/2022   Near syncope 09/03/2020   Pulmonary nodule, right 06/26/2019   MDD (major depressive disorder), recurrent episode (HCC) 12/26/2018   Emphysema lung (HCC) 03/28/2018   Episodic lightheadedness 08/08/2017   Essential hypertension 03/05/2016   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   Left knee pain 04/02/2015   Migraine 05/08/2013   Osteoporosis 11/10/2009   Vitamin D  deficiency 11/04/2009   Hyperlipidemia 11/04/2009   Backache 11/04/2009   FATIGUE 11/04/2009   Asymptomatic postmenopausal status 11/04/2009    PCP: Alvan Craven MD REFERRING PROVIDER: Alvan Craven MD REFERRING DIAG:  Diagnosis  S32.020A (ICD-10-CM) - Compression fracture of L2 vertebra, initial encounter (HCC)   Rationale for Evaluation and Treatment: Rehabilitation  THERAPY DIAG:  Other low back pain  Muscle weakness (generalized)  ONSET DATE: 09/13/23   SUBJECTIVE:                                                                                                                                                                                           SUBJECTIVE STATEMENT: Patient reports 5-6/10 pain and took OTC pain medication prior to PT today. Patient states she has been fitted for back brace and will pick it up this afternoon. Patient states HEP is helping.  EVAL: The patient reports that she had a fall on May 28 and hit her head and bumped her arm. 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 She is having significant pain that is  constant in nature. She cannot get down on the floor (she would get down to pray or exercise prior to this pain) and notes difficulty trying to get up.   PERTINENT HISTORY:  H/o back pain from early 90s. A-fib, emphysema, migraine, HTN.  PAIN:  Are you having pain? Yes: NPRS scale: sitting down it is a 5/10, goes up to 10/10 Pain location: low back Pain description: soreness, gets to be sharp pain and muscle spasms Aggravating factors: getting up after sitting Relieving factors: heating pad,   PRECAUTIONS: Back *precautions due to acute compression fx and osteoporosis  RED FLAGS: Compression fracture: Yes: fall on May 28 with pain beginning 2 weeks post fall   Bowel and bladder: denies issues Denies saddle paresthesias  WEIGHT BEARING RESTRICTIONS: No  FALLS:  Has patient fallen in last 6 months? Yes. Number of falls 1 time in May; she describes 2-3 over the past year  LIVING ENVIRONMENT: Lives with: lives alone Lives in: House/apartment Stairs: Yes: Internal: 12 to basement, 4-5 going into home;  steps; one rail Has following equipment at home: None  PLOF: Independent  PATIENT GOALS: reduce, walk upright, return to doing exercises  OBJECTIVE:  Note: Objective measures were completed at Evaluation unless otherwise noted.  DIAGNOSTIC FINDINGS:  IMPRESSION: 1. New mild compression deformity of L2, possibly acute. 2. Stable L1 compression deformity. 3. Moderate  multilevel degenerative change.   PATIENT SURVEYS:  Modified Oswestry: 54%  COGNITION: Overall cognitive status: Within functional limits for tasks assessed     SENSATION: WFL  POSTURE: No Significant postural limitations  PALPATION: None today-- due to   LUMBAR ROM: Deferred due to recent compression fx AROM eval  Flexion   Extension   Right lateral flexion   Left lateral flexion   Right rotation   Left rotation    (Blank rows = not tested)  LOWER EXTREMITY ROM:   WFLs Active  Right eval Left eval  Hip flexion    Hip extension    Hip abduction    Hip adduction    Hip internal rotation    Hip external rotation    Knee flexion    Knee extension    Ankle dorsiflexion    Ankle plantarflexion    Ankle inversion    Ankle eversion     (Blank rows = not tested)  LOWER EXTREMITY MMT:  deferred due to pain-- able to do SLR  MMT Right eval Left eval  Hip flexion    Hip extension    Hip abduction    Hip adduction    Hip internal rotation    Hip external rotation    Knee flexion    Knee extension    Ankle dorsiflexion    Ankle plantarflexion    Ankle inversion    Ankle eversion     (Blank rows = not tested)  LUMBAR SPECIAL TESTS:  Deferred due to compression fx  FUNCTIONAL TESTS:  Log roll for bed mobility instructed and performed  GAIT: Distance walked: 20/13.16 seconds=1.52 ft/sec Assistive device utilized: None Level of assistance: Complete Independence Comments: slowed pace Pain after walking when she first sits down   [facility] Adult PT Treatment:                                                DATE: 09/19/2023 Therapeutic Exercise: Supine diaphragmatic breathing SLR +  folded towel underneath pelvis x6 (bil) --> SLR (no towel) x10 (bil) Log rolling for transitions sit <--> supine Neuromuscular re-ed: Bent knee fall out x10 (bil) Hooklying hip add + ball b/w knees & breathing cues Unilateral 90/90 isometric hold Core marching  Beginner  bridge   [facility] Adult PT Treatment:                                                DATE: 09/14/23 Therapeutic Exercise: See HEP   PATIENT EDUCATION:  Education details: HEP Person educated: Patient Education method: Programmer, multimedia, Demonstration, and Handouts Education comprehension: verbalized understanding, returned demonstration, and needs further education  HOME EXERCISE PROGRAM: Access Code: [email or web address] URL: [email or web address] Date: 09/19/2023 Prepared by: Lamarr Price  Program Notes THESE SHOULD NOT INCREASE PAIN. If so, wait until you return to therapy.  Exercises - Supine Active Straight Leg Raise  - 1 x daily - 5 x weekly - 1 sets - 10 reps - Heel Raises with Counter Support  - 1 x daily - 5 x weekly - 1 sets - 10 reps - Supine Diaphragmatic Breathing  - 1 x daily - 7 x weekly - 3 sets - 10 reps - Supine March  - 1 x daily - 7 x weekly - 3 sets - 10 reps - Beginner Bridge  - 1 x daily - 7 x weekly - 3 sets - 10 reps  ASSESSMENT: CLINICAL IMPRESSION: HEP reviewed and added with new exercises with focus on core stabilization and progressing glute strength. Reviewed log rolling alignment with transitions from supine to seated. Small range bridges introduced, maintaining neutral pelvic alignment; patient able to complete all exercises with no exacerbation of pain.   EVAL: Patient is an 83  y.o. female who was seen today for physical therapy evaluation and treatment for compression fx and low back pain. Her pain varies from 5/10 up to 10/10 and is typically worse in the morning x first hour of waking, coughing, and after sitting and transitioning to stand. Evaluation was limited due to high irritability and precautions due to fx. She presents with impairments in ROM, strength, pain, and overall functional mobility. PT recommends lumbar brace due to characteristics of pain during evaluation (winces with coughing and sit<>stand)-- will reach out to request  order.  OBJECTIVE IMPAIRMENTS: decreased mobility, difficulty walking, decreased ROM, decreased strength, increased fascial restrictions, increased muscle spasms, impaired flexibility, postural dysfunction, and pain.   ACTIVITY LIMITATIONS: lifting, bending, standing, stairs, transfers, and locomotion level  PARTICIPATION LIMITATIONS: meal prep, cleaning, laundry, shopping, community activity, and yard work  PERSONAL FACTORS: 3+ comorbidities: osteoporosis, a-fib, HTN are also affecting patient's functional outcome.   REHAB POTENTIAL: Good  CLINICAL DECISION MAKING: Unstable/unpredictable  EVALUATION COMPLEXITY: High   GOALS: Goals reviewed with patient? Yes  SHORT TERM GOALS: Target date: 10/14/23   The patient will be indep with initial HEP Baseline: initiated at eval Goal status: INITIAL  2.  The patient will report pain at worst 6/10 Baseline:  10/10 Goal status: INITIAL  LONG TERM GOALS: Target date: 11/14/23   The patient will be indep with HEP progression. Baseline:  Initiated at eval Goal status: INITIAL  2.  The patient will improve modified oswestry score by 12% Baseline: see above Goal status: INITIAL  3.  The patient will improve gait speed to 2.3 ft/sec Baseline: 1.52 ft/sec Goal status: INITIAL  4.  The patient will improve pain to < or equal to 2/10 at rest. Baseline:  5-6/10 Goal status: INITIAL PLAN:  PT FREQUENCY: 2x/week  PT DURATION: 8 weeks  PLANNED INTERVENTIONS: 97164- PT Re-evaluation, 97750- Physical Performance Testing, 97110-Therapeutic exercises, 97530- Therapeutic activity, [id number]- Neuromuscular re-education, 97535- Self Care, 02859- Manual therapy, [id number]- Gait training, [phone]- Aquatic Therapy, Patient/Family education, Balance training, Cryotherapy, and Moist heat.  PLAN FOR NEXT SESSION: Continue bracing at neutral, LE strengthening to tolerance, progress to patient tolerance, gait training, return to functional tasks with  modifications and education re: osteoporosis.    Lamarr GORMAN Price, PTA 09/19/2023, 11:45 AM

## 2023-09-19 NOTE — Progress Notes (Signed)
    Procedures performed today:    None.  Independent interpretation of notes and tests performed by another provider:   None.  Brief History, Exam, Impression, and Recommendations:    Lumbar spondylosis 83 year old female, chronic axial low back pain, she has history of epidurals, she had an episode sometime in May where she fell in the shower, she had pain in the head, right shoulder but never had any pain in her back, pain in her back did not start until approximately 2 weeks later, and it was in the low back. X-rays were done that did show an old L1 compression fracture, as well as some wedging of the L2 vertebrae but mild. It was suspected this may be the source of her pain. I am seeing her for the first time today, today she has no pain whatsoever over the upper lumbar spine to palpation or percussion, her pain is further on the lower lumbar spine, I do not think that her L2 compression fracture is acute, and if it is it is no longer symptomatic. I think her dominant problem is the underlying lumbar spondylosis. Adding arthritis acetaminophen , formal physical therapy, her symptoms have been present for over 6 weeks we will add a lumbar spine MRI for epidural planning. We will not be applying a lumbar orthosis today.  I spent 30 minutes of total time managing this patient today, this includes chart review, face to face, and non-face to face time.  ____________________________________________ Debby PARAS. Curtis, M.D., ABFM., CAQSM., AME. Primary Care and Sports Medicine [facility]: [facility]  Adjunct Professor of [facility] [employer] [facility] Medicine  [employer]  Restaurant manager, fast food

## 2023-09-19 NOTE — Assessment & Plan Note (Addendum)
 83 year old female, chronic axial low back pain, she has history of epidurals, she had an episode sometime in May where she fell in the shower, she had pain in the head, right shoulder but never had any pain in her back, pain in her back did not start until approximately 2 weeks later, and it was in the low back. X-rays were done that did show an old L1 compression fracture, as well as some wedging of the L2 vertebrae but mild. It was suspected this may be the source of her pain. I am seeing her for the first time today, today she has no pain whatsoever over the upper lumbar spine to palpation or percussion, her pain is further on the lower lumbar spine, I do not think that her L2 compression fracture is acute, and if it is it is no longer symptomatic. I think her dominant problem is the underlying lumbar spondylosis. Adding arthritis acetaminophen , formal physical therapy, her symptoms have been present for over 6 weeks we will add a lumbar spine MRI for epidural planning. We will not be applying a lumbar orthosis today.

## 2023-09-21 ENCOUNTER — Ambulatory Visit

## 2023-09-21 DIAGNOSIS — M6281 Muscle weakness (generalized): Secondary | ICD-10-CM

## 2023-09-21 DIAGNOSIS — M5459 Other low back pain: Secondary | ICD-10-CM | POA: Diagnosis not present

## 2023-09-21 NOTE — Therapy (Signed)
 OUTPATIENT PHYSICAL THERAPY THORACOLUMBAR TREATMENT  Patient Name: Christy Gutierrez MRN: [id number] DOB:1940/10/28, 83 y.o., female Today's Date: 09/21/2023  END OF SESSION:  PT End of Session - 09/21/23 0932     Visit Number 3    Number of Visits 16    Date for PT Re-Evaluation 11/13/23    Authorization Type aetna medicare    PT Start Time 0932    PT Stop Time 1015    PT Time Calculation (min) 43 min    Activity Tolerance Patient tolerated treatment well    Behavior During Therapy WFL for tasks assessed/performed         Past Medical History:  Diagnosis Date   Atrial fibrillation (HCC)    Back pain    Hypertension    Past Surgical History:  Procedure Laterality Date   SKIN CANCER EXCISION  05/2022   on the chest   TONSILLECTOMY     TUBAL LIGATION     bilateral   Patient Active Problem List   Diagnosis Date Noted   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   Atrial fibrillation (HCC) 10/06/2022   Near syncope 09/03/2020   Pulmonary nodule, right 06/26/2019   MDD (major depressive disorder), recurrent episode (HCC) 12/26/2018   Emphysema lung (HCC) 03/28/2018   Episodic lightheadedness 08/08/2017   Essential hypertension 03/05/2016   Lumbar spondylosis 04/02/2015   Left knee pain 04/02/2015   Migraine 05/08/2013   Osteoporosis 11/10/2009   Vitamin D  deficiency 11/04/2009   Hyperlipidemia 11/04/2009   Backache 11/04/2009   FATIGUE 11/04/2009   Asymptomatic postmenopausal status 11/04/2009    PCP: Alvan Craven MD REFERRING PROVIDER: Alvan Craven MD REFERRING DIAG:  Diagnosis  S32.020A (ICD-10-CM) - Compression fracture of L2 vertebra, initial encounter (HCC)   Rationale for Evaluation and Treatment: Rehabilitation  THERAPY DIAG:  Other low back pain  Muscle weakness (generalized)  ONSET DATE: 09/13/23   SUBJECTIVE:                                                                                                                                                                                           SUBJECTIVE STATEMENT: Patient reports her back is feeling better, states 4-5/10 pain that is not acute. Patient states she is able to stand up with more ease and is able to sneeze and cough without the need to brace. Patient has MRI scheduled on Monday. Patient states she continues to feel weak.   EVAL: The patient reports that she had a fall on May 28 and hit her head and bumped her arm. She did not notice immediate back pain, but notes pain began 2 weeks later when she  mowed the yard on a [employer]. She is having significant pain that is constant in nature. She cannot get down on the floor (she would get down to pray or exercise prior to this pain) and notes difficulty trying to get up.   PERTINENT HISTORY:  H/o back pain from early 90s. A-fib, emphysema, migraine, HTN.  PAIN:  Are you having pain? Yes: NPRS scale: sitting down it is a 5/10, goes up to 10/10 Pain location: low back Pain description: soreness, gets to be sharp pain and muscle spasms Aggravating factors: getting up after sitting Relieving factors: heating pad,   PRECAUTIONS: Back *precautions due to acute compression fx and osteoporosis  RED FLAGS: Compression fracture: Yes: fall on May 28 with pain beginning 2 weeks post fall   Bowel and bladder: denies issues Denies saddle paresthesias  WEIGHT BEARING RESTRICTIONS: No  FALLS:  Has patient fallen in last 6 months? Yes. Number of falls 1 time in May; she describes 2-3 over the past year  LIVING ENVIRONMENT: Lives with: lives alone Lives in: House/apartment Stairs: Yes: Internal: 12 to basement, 4-5 going into home;  steps; one rail Has following equipment at home: None  PLOF: Independent  PATIENT GOALS: reduce, walk upright, return to doing exercises  OBJECTIVE:  Note: Objective measures were completed at Evaluation unless otherwise noted.  DIAGNOSTIC FINDINGS:  IMPRESSION: 1. New mild compression  deformity of L2, possibly acute. 2. Stable L1 compression deformity. 3. Moderate multilevel degenerative change.   PATIENT SURVEYS:  Modified Oswestry: 54%  COGNITION: Overall cognitive status: Within functional limits for tasks assessed     SENSATION: WFL  POSTURE: No Significant postural limitations  PALPATION: None today-- due to   LUMBAR ROM: Deferred due to recent compression fx AROM eval  Flexion   Extension   Right lateral flexion   Left lateral flexion   Right rotation   Left rotation    (Blank rows = not tested)  LOWER EXTREMITY ROM:   WFLs Active  Right eval Left eval  Hip flexion    Hip extension    Hip abduction    Hip adduction    Hip internal rotation    Hip external rotation    Knee flexion    Knee extension    Ankle dorsiflexion    Ankle plantarflexion    Ankle inversion    Ankle eversion     (Blank rows = not tested)  LOWER EXTREMITY MMT:  deferred due to pain-- able to do SLR  MMT Right eval Left eval  Hip flexion    Hip extension    Hip abduction    Hip adduction    Hip internal rotation    Hip external rotation    Knee flexion    Knee extension    Ankle dorsiflexion    Ankle plantarflexion    Ankle inversion    Ankle eversion     (Blank rows = not tested)  LUMBAR SPECIAL TESTS:  Deferred due to compression fx  FUNCTIONAL TESTS:  Log roll for bed mobility instructed and performed  GAIT: Distance walked: 20/13.16 seconds=1.52 ft/sec Assistive device utilized: None Level of assistance: Complete Independence Comments: slowed pace Pain after walking when she first sits down   [facility] Adult PT Treatment:  DATE: 09/21/2023 Therapeutic Exercise: Cervical nods/turns (small ROM) with ridge of skull on large noodle (dowel inside) Cervical nods/turns with head on deflated ball Diaphragmatic breathing with head on deflated ball Manual Therapy: STM Lt upper trapezius Pin & stretch Lt  UT MWM proximal UT (Lt) (TPR) Neuromuscular re-ed: Bent knee fall out Beginner bridge 2x10 Hooklying hip add with ball squeeze 10x5 --> towel roll placed for lumbar support Hooklying hip abd with green TB 10x5     [facility] Adult PT Treatment:                                                DATE: 09/19/2023 Therapeutic Exercise: Supine diaphragmatic breathing SLR + folded towel underneath pelvis x6 (bil) --> SLR (no towel) x10 (bil) Log rolling for transitions sit <--> supine Neuromuscular re-ed: Bent knee fall out x10 (bil) Hooklying hip add + ball b/w knees & breathing cues Unilateral 90/90 isometric hold Core marching  Beginner bridge   [facility] Adult PT Treatment:                                                DATE: 09/14/23 Therapeutic Exercise: See HEP   PATIENT EDUCATION:  Education details: HEP Person educated: Patient Education method: Programmer, multimedia, Demonstration, and Handouts Education comprehension: verbalized understanding, returned demonstration, and needs further education  HOME EXERCISE PROGRAM: Access Code: [email or web address] URL: [email or web address] Date: 09/19/2023 Prepared by: Lamarr Price  Program Notes THESE SHOULD NOT INCREASE PAIN. If so, wait until you return to therapy.  Exercises - Supine Active Straight Leg Raise  - 1 x daily - 5 x weekly - 1 sets - 10 reps - Heel Raises with Counter Support  - 1 x daily - 5 x weekly - 1 sets - 10 reps - Supine Diaphragmatic Breathing  - 1 x daily - 7 x weekly - 3 sets - 10 reps - Supine March  - 1 x daily - 7 x weekly - 3 sets - 10 reps - Beginner Bridge  - 1 x daily - 7 x weekly - 3 sets - 10 reps   ASSESSMENT: CLINICAL IMPRESSION: Manual and gentle cervical mobility exercises to address primary subjective pain symptoms on Lt cervical musculature. LE strengthening continued; towel roll placed placed to provide lumbar support and promote neutral pelvic placement. Noted improvement in lumbar mobility and less  pain during positional transitions.   EVAL: Patient is an 83  y.o. female who was seen today for physical therapy evaluation and treatment for compression fx and low back pain. Her pain varies from 5/10 up to 10/10 and is typically worse in the morning x first hour of waking, coughing, and after sitting and transitioning to stand. Evaluation was limited due to high irritability and precautions due to fx. She presents with impairments in ROM, strength, pain, and overall functional mobility. PT recommends lumbar brace due to characteristics of pain during evaluation (winces with coughing and sit<>stand)-- will reach out to request order.  OBJECTIVE IMPAIRMENTS: decreased mobility, difficulty walking, decreased ROM, decreased strength, increased fascial restrictions, increased muscle spasms, impaired flexibility, postural dysfunction, and pain.   ACTIVITY LIMITATIONS: lifting, bending, standing, stairs, transfers, and locomotion level  PARTICIPATION LIMITATIONS: meal prep, cleaning, laundry, shopping, community activity, and  yard work  PERSONAL FACTORS: 3+ comorbidities: osteoporosis, a-fib, HTN are also affecting patient's functional outcome.   REHAB POTENTIAL: Good  CLINICAL DECISION MAKING: Unstable/unpredictable  EVALUATION COMPLEXITY: High   GOALS: Goals reviewed with patient? Yes  SHORT TERM GOALS: Target date: 10/14/23   The patient will be indep with initial HEP Baseline: initiated at eval Goal status: INITIAL  2.  The patient will report pain at worst 6/10 Baseline:  10/10 Goal status: INITIAL  LONG TERM GOALS: Target date: 11/14/23   The patient will be indep with HEP progression. Baseline:  Initiated at eval Goal status: INITIAL  2.  The patient will improve modified oswestry score by 12% Baseline: see above Goal status: INITIAL  3.  The patient will improve gait speed to 2.3 ft/sec Baseline: 1.52 ft/sec Goal status: INITIAL  4.  The patient will improve pain to <  or equal to 2/10 at rest. Baseline:  5-6/10 Goal status: INITIAL PLAN:  PT FREQUENCY: 2x/week  PT DURATION: 8 weeks  PLANNED INTERVENTIONS: [id number]- PT Re-evaluation, 97750- Physical Performance Testing, 97110-Therapeutic exercises, 97530- Therapeutic activity, [id number]- Neuromuscular re-education, 97535- Self Care, [id number]- Manual therapy, [id number]- Gait training, [id number]- Aquatic Therapy, Patient/Family education, Balance training, Cryotherapy, and Moist heat.  PLAN FOR NEXT SESSION: Continue bracing at neutral, promote lumbar curve awareness, LE strengthening to tolerance, progress to patient tolerance, gait training, return to functional tasks with modifications and education re: osteoporosis.    Lamarr GORMAN Price, PTA 09/21/2023, 10:16 AM

## 2023-09-26 ENCOUNTER — Ambulatory Visit

## 2023-09-26 DIAGNOSIS — M545 Low back pain, unspecified: Secondary | ICD-10-CM | POA: Diagnosis not present

## 2023-09-26 DIAGNOSIS — G8929 Other chronic pain: Secondary | ICD-10-CM

## 2023-09-26 NOTE — Therapy (Signed)
 OUTPATIENT PHYSICAL THERAPY THORACOLUMBAR TREATMENT  Patient Name: Genelle Economou MRN: [id number] DOB:1940-04-14, 83 y.o., female Today's Date: 09/27/2023  END OF SESSION:  PT End of Session - 09/27/23 1023     Visit Number 4    Date for PT Re-Evaluation 11/13/23    Authorization Type aetna medicare    PT Start Time 1018    PT Stop Time 1103    PT Time Calculation (min) 45 min    Activity Tolerance Patient tolerated treatment well    Behavior During Therapy WFL for tasks assessed/performed          Past Medical History:  Diagnosis Date   Atrial fibrillation (HCC)    Back pain    Hypertension    Past Surgical History:  Procedure Laterality Date   SKIN CANCER EXCISION  05/2022   on the chest   TONSILLECTOMY     TUBAL LIGATION     bilateral   Patient Active Problem List   Diagnosis Date Noted   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   Atrial fibrillation (HCC) 10/06/2022   Near syncope 09/03/2020   Pulmonary nodule, right 06/26/2019   MDD (major depressive disorder), recurrent episode (HCC) 12/26/2018   Emphysema lung (HCC) 03/28/2018   Episodic lightheadedness 08/08/2017   Essential hypertension 03/05/2016   Lumbar spondylosis 04/02/2015   Left knee pain 04/02/2015   Migraine 05/08/2013   Osteoporosis 11/10/2009   Vitamin D  deficiency 11/04/2009   Hyperlipidemia 11/04/2009   Backache 11/04/2009   FATIGUE 11/04/2009   Asymptomatic postmenopausal status 11/04/2009    PCP: Alvan Craven MD REFERRING PROVIDER: Alvan Craven MD REFERRING DIAG:  Diagnosis  S32.020A (ICD-10-CM) - Compression fracture of L2 vertebra, initial encounter (HCC)   Rationale for Evaluation and Treatment: Rehabilitation  THERAPY DIAG:  Other low back pain  Muscle weakness (generalized)  ONSET DATE: 09/13/23   SUBJECTIVE:                                                                                                                                                                                           SUBJECTIVE STATEMENT: As the back is getting better her neck is worsening. Patient had MRI on Monday for LBP.   EVAL: The patient reports that she had a fall on May 28 and hit her head and bumped her arm. 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 She is having significant pain that is constant in nature. She cannot get down on the floor (she would get down to pray or exercise prior to this pain) and notes  difficulty trying to get up.   PERTINENT HISTORY:  Osteoporosis, H/o back pain from early 90s. A-fib, emphysema, migraine, HTN.  PAIN:  Are you having pain? Yes: NPRS scale: sitting down it is a 4-5/10, goes up to 10/10 Pain location: low back and neck Pain description: soreness, gets to be sharp pain and muscle spasms Aggravating factors: getting up after sitting Relieving factors: heating pad,   PRECAUTIONS: Back *precautions due to acute compression fx and osteoporosis  RED FLAGS: Compression fracture: Yes: fall on May 28 with pain beginning 2 weeks post fall   Bowel and bladder: denies issues Denies saddle paresthesias  WEIGHT BEARING RESTRICTIONS: No  FALLS:  Has patient fallen in last 6 months? Yes. Number of falls 1 time in May; she describes 2-3 over the past year  LIVING ENVIRONMENT: Lives with: lives alone Lives in: House/apartment Stairs: Yes: Internal: 12 to basement, 4-5 going into home;  steps; one rail Has following equipment at home: None  PLOF: Independent  PATIENT GOALS: reduce, walk upright, return to doing exercises  OBJECTIVE:  Note: Objective measures were completed at Evaluation unless otherwise noted.  DIAGNOSTIC FINDINGS:  IMPRESSION: 1. New mild compression deformity of L2, possibly acute. 2. Stable L1 compression deformity. 3. Moderate multilevel degenerative change.   PATIENT SURVEYS:  Modified Oswestry: 54%  COGNITION: Overall  cognitive status: Within functional limits for tasks assessed     SENSATION: WFL  POSTURE: No Significant postural limitations  PALPATION: None today-- due to   LUMBAR ROM: Deferred due to recent compression fx AROM eval  Flexion   Extension   Right lateral flexion   Left lateral flexion   Right rotation   Left rotation    (Blank rows = not tested)  LOWER EXTREMITY ROM:   WFLs Active  Right eval Left eval  Hip flexion    Hip extension    Hip abduction    Hip adduction    Hip internal rotation    Hip external rotation    Knee flexion    Knee extension    Ankle dorsiflexion    Ankle plantarflexion    Ankle inversion    Ankle eversion     (Blank rows = not tested)  LOWER EXTREMITY MMT:  deferred due to pain-- able to do SLR  MMT Right eval Left eval  Hip flexion    Hip extension    Hip abduction    Hip adduction    Hip internal rotation    Hip external rotation    Knee flexion    Knee extension    Ankle dorsiflexion    Ankle plantarflexion    Ankle inversion    Ankle eversion     (Blank rows = not tested)  LUMBAR SPECIAL TESTS:  Deferred due to compression fx  FUNCTIONAL TESTS:  Log roll for bed mobility instructed and performed  GAIT: Distance walked: 20/13.16 seconds=1.52 ft/sec Assistive device utilized: None Level of assistance: Complete Independence Comments: slowed pace Pain after walking when she first sits down   [facility] Adult PT Treatment:                                                DATE: 09/27/2023 Therapeutic Exercise: Nustep L1 x 5 min Cervical nods/turns (small ROM) with ridge of skull on large noodle  Therapeutic Activity: sit to stands from mat +foam arms  out in front with rising; at sides for slow eccentric descent (added to HEP) Neuromuscular re-ed: Seated in good posture: scapular retraction with ER x 10, rows x 15 red Tband Bent knee fall out Beginner bridge 2x10 Hooklying hip add with ball squeeze 10x5 --> towel roll  placed for lumbar support Hooklying hip abd with green TB 2x10x5   [facility] Adult PT Treatment:                                                DATE: 09/21/2023 Therapeutic Exercise: Cervical nods/turns (small ROM) with ridge of skull on large noodle (dowel inside) Cervical nods/turns with head on deflated ball Diaphragmatic breathing with head on deflated ball Manual Therapy: STM Lt upper trapezius Pin & stretch Lt UT MWM proximal UT (Lt) (TPR) Neuromuscular re-ed: Bent knee fall out Beginner bridge 2x10 Hooklying hip add with ball squeeze 10x5 --> towel roll placed for lumbar support Hooklying hip abd with green TB 10x5     [facility] Adult PT Treatment:                                                DATE: 09/19/2023 Therapeutic Exercise: Supine diaphragmatic breathing SLR + folded towel underneath pelvis x6 (bil) --> SLR (no towel) x10 (bil) Log rolling for transitions sit <--> supine Neuromuscular re-ed: Bent knee fall out x10 (bil) Hooklying hip add + ball b/w knees & breathing cues Unilateral 90/90 isometric hold Core marching  Beginner bridge   [facility] Adult PT Treatment:                                                DATE: 09/14/23 Therapeutic Exercise: See HEP   PATIENT EDUCATION:  Education details: HEP Person educated: Patient Education method: Programmer, multimedia, Demonstration, and Handouts Education comprehension: verbalized understanding, returned demonstration, and needs further education  HOME EXERCISE PROGRAM: Access Code: [email or web address] URL: [email or web address] Date: 09/27/2023 Prepared by: Mliss  Program Notes THESE SHOULD NOT INCREASE PAIN. If so, wait until you return to therapy.  Exercises - Supine Active Straight Leg Raise  - 1 x daily - 5 x weekly - 1 sets - 10 reps - Heel Raises with Counter Support  - 1 x daily - 5 x weekly - 1 sets - 10 reps - Supine Diaphragmatic Breathing  - 1 x daily - 7 x weekly - 3 sets - 10 reps - Supine March  - 1 x daily  - 7 x weekly - 3 sets - 10 reps - Beginner Bridge  - 1 x daily - 7 x weekly - 3 sets - 10 reps - Sit to Stand Without Arm Support  - 2-3 x daily - 7 x weekly - 1 sets - 5 reps   ASSESSMENT: CLINICAL IMPRESSION:  Patient reporting ongoing neck pain. She responded very well to MELT method on noodle and advised to do at home using a towel roll. Her back pain is decreasing overall. She was challenged with sit to stands and stand to sits. We focused on slow descent to limit compression of spine. Progressed HEP with this. No  increased pain with TE, but transitional movements after were painful. Pt continues to demonstrate potential for improvement and would benefit from continued skilled therapy to address impairments.    EVAL: Patient is an 83  y.o. female who was seen today for physical therapy evaluation and treatment for compression fx and low back pain. Her pain varies from 5/10 up to 10/10 and is typically worse in the morning x first hour of waking, coughing, and after sitting and transitioning to stand. Evaluation was limited due to high irritability and precautions due to fx. She presents with impairments in ROM, strength, pain, and overall functional mobility. PT recommends lumbar brace due to characteristics of pain during evaluation (winces with coughing and sit<>stand)-- will reach out to request order.  OBJECTIVE IMPAIRMENTS: decreased mobility, difficulty walking, decreased ROM, decreased strength, increased fascial restrictions, increased muscle spasms, impaired flexibility, postural dysfunction, and pain.   ACTIVITY LIMITATIONS: lifting, bending, standing, stairs, transfers, and locomotion level  PARTICIPATION LIMITATIONS: meal prep, cleaning, laundry, shopping, community activity, and yard work  PERSONAL FACTORS: 3+ comorbidities: osteoporosis, a-fib, HTN are also affecting patient's functional outcome.   REHAB POTENTIAL: Good  CLINICAL DECISION MAKING:  Unstable/unpredictable  EVALUATION COMPLEXITY: High   GOALS: Goals reviewed with patient? Yes  SHORT TERM GOALS: Target date: 10/14/23   The patient will be indep with initial HEP Baseline: initiated at eval Goal status: INITIAL  2.  The patient will report pain at worst 6/10 Baseline:  10/10 Goal status: INITIAL  LONG TERM GOALS: Target date: 11/14/23   The patient will be indep with HEP progression. Baseline:  Initiated at eval Goal status: INITIAL  2.  The patient will improve modified oswestry score by 12% Baseline: see above Goal status: INITIAL  3.  The patient will improve gait speed to 2.3 ft/sec Baseline: 1.52 ft/sec Goal status: INITIAL  4.  The patient will improve pain to < or equal to 2/10 at rest. Baseline:  5-6/10 Goal status: INITIAL PLAN:  PT FREQUENCY: 2x/week  PT DURATION: 8 weeks  PLANNED INTERVENTIONS: 97164- PT Re-evaluation, 97750- Physical Performance Testing, 97110-Therapeutic exercises, 97530- Therapeutic activity, [id number]- Neuromuscular re-education, 97535- Self Care, [id number]- Manual therapy, [id number]- Gait training, [phone]- Aquatic Therapy, Patient/Family education, Balance training, Cryotherapy, and Moist heat.  PLAN FOR NEXT SESSION: Continue bracing at neutral, promote lumbar curve awareness, LE strengthening to tolerance, progress to patient tolerance, gait training, return to functional tasks with modifications and education re: osteoporosis.    Mliss Cummins, PT  09/27/2023, 12:14 PM

## 2023-09-27 ENCOUNTER — Ambulatory Visit: Admitting: Physical Therapy

## 2023-09-27 ENCOUNTER — Encounter: Payer: Self-pay | Admitting: Physical Therapy

## 2023-09-27 DIAGNOSIS — M5459 Other low back pain: Secondary | ICD-10-CM

## 2023-09-27 DIAGNOSIS — M6281 Muscle weakness (generalized): Secondary | ICD-10-CM

## 2023-09-30 ENCOUNTER — Ambulatory Visit: Payer: Self-pay | Admitting: Sports Medicine

## 2023-09-30 ENCOUNTER — Telehealth: Payer: Self-pay

## 2023-09-30 ENCOUNTER — Ambulatory Visit: Attending: Family Medicine | Admitting: Rehabilitative and Restorative Service Providers"

## 2023-09-30 DIAGNOSIS — M5459 Other low back pain: Secondary | ICD-10-CM | POA: Insufficient documentation

## 2023-09-30 DIAGNOSIS — M6281 Muscle weakness (generalized): Secondary | ICD-10-CM | POA: Diagnosis present

## 2023-09-30 NOTE — Telephone Encounter (Signed)
   Pre-operative Risk Assessment    Patient Name: Christy Gutierrez  DOB: January 31, 1941 MRN: [id number]   Date of last office visit: 04/04/23 CAMPBELL SHALLOW, MD Date of next office visit: 01/09/24 BRAIN CRENSHAW, MD  Request for Surgical Clearance    Procedure:  Dental Extraction - Amount of Teeth to be Pulled:  1 TOOTH (#4) EXTRACTION WITH HEMOSTATIC DRESSING  Date of Surgery:  Clearance TBD                                Surgeon:  VERNEITA PRIMES, DDS, MD Surgeon's Group or Practice Name:  [facility] Phone number:  [phone] Fax number:  [phone]   Type of Clearance Requested:   - Medical  - Pharmacy:  Hold Apixaban  (Eliquis ) 1 DAY PRIOR   Type of Anesthesia:  Local AND NITROUS OXIDE   Additional requests/questions:    Signed, Lucie DELENA Ku   09/30/2023, 8:05 AM

## 2023-09-30 NOTE — Therapy (Signed)
 OUTPATIENT PHYSICAL THERAPY THORACOLUMBAR TREATMENT  Patient Name: Christy Gutierrez MRN: [id number] DOB:10-20-1940, 83 y.o., female Today's Date: 09/30/2023  END OF SESSION:  PT End of Session - 09/30/23 0844     Visit Number 5    Number of Visits 16    Date for PT Re-Evaluation 11/13/23    Authorization Type aetna medicare    PT Start Time 0845    PT Stop Time 0925    PT Time Calculation (min) 40 min    Activity Tolerance Patient tolerated treatment well    Behavior During Therapy [facility] for tasks assessed/performed          Past Medical History:  Diagnosis Date   Atrial fibrillation (HCC)    Back pain    Hypertension    Past Surgical History:  Procedure Laterality Date   SKIN CANCER EXCISION  05/2022   on the chest   TONSILLECTOMY     TUBAL LIGATION     bilateral   Patient Active Problem List   Diagnosis Date Noted   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   Atrial fibrillation (HCC) 10/06/2022   Near syncope 09/03/2020   Pulmonary nodule, right 06/26/2019   MDD (major depressive disorder), recurrent episode (HCC) 12/26/2018   Emphysema lung (HCC) 03/28/2018   Episodic lightheadedness 08/08/2017   Essential hypertension 03/05/2016   Lumbar spondylosis 04/02/2015   Left knee pain 04/02/2015   Migraine 05/08/2013   Osteoporosis 11/10/2009   Vitamin D  deficiency 11/04/2009   Hyperlipidemia 11/04/2009   Backache 11/04/2009   FATIGUE 11/04/2009   Asymptomatic postmenopausal status 11/04/2009    PCP: Alvan Craven MD REFERRING PROVIDER: Alvan Craven MD REFERRING DIAG:  Diagnosis  S32.020A (ICD-10-CM) - Compression fracture of L2 vertebra, initial encounter (HCC)   Rationale for Evaluation and Treatment: Rehabilitation  THERAPY DIAG:  Other low back pain  Muscle weakness (generalized)  ONSET DATE: 09/13/23   SUBJECTIVE:                                                                                                                                                                                           SUBJECTIVE STATEMENT: She notes she cannot walk straight due to her spinal positioning. She notes she is improving ROM  with HEP. All of the exercises make me feel good. She said she is having a hard time with sit<>stand.   EVAL: The patient reports that she had a fall on May 28 and hit her head and bumped her arm. 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 She is having  significant pain that is constant in nature. She cannot get down on the floor (she would get down to pray or exercise prior to this pain) and notes difficulty trying to get up.   PERTINENT HISTORY:  Osteoporosis, H/o back pain from early 90s. A-fib, emphysema, migraine, HTN.  PAIN:  Are you having pain? Yes: NPRS scale: neck 5/10 when she wakes; back is 3-4/10 Pain location: low back and neck Pain description: soreness, gets to be sharp pain and muscle spasms Aggravating factors: getting up after sitting Relieving factors: heating pad,   PRECAUTIONS: Back *precautions due to acute compression fx and osteoporosis  RED FLAGS: Compression fracture: Yes: fall on May 28 with pain beginning 2 weeks post fall   Bowel and bladder: denies issues Denies saddle paresthesias  WEIGHT BEARING RESTRICTIONS: No  FALLS:  Has patient fallen in last 6 months? Yes. Number of falls 1 time in May; she describes 2-3 over the past year  LIVING ENVIRONMENT: Lives with: lives alone Lives in: House/apartment Stairs: Yes: Internal: 12 to basement, 4-5 going into home;  steps; one rail Has following equipment at home: None  PLOF: Independent  PATIENT GOALS: reduce, walk upright, return to doing exercises  OBJECTIVE:  Note: Objective measures were completed at Evaluation unless otherwise noted.  DIAGNOSTIC FINDINGS:  IMPRESSION: 1. New mild compression deformity of L2, possibly acute. 2. Stable L1 compression  deformity. 3. Moderate multilevel degenerative change.   PATIENT SURVEYS:  Modified Oswestry: 54%  COGNITION: Overall cognitive status: Within functional limits for tasks assessed     SENSATION: WFL  POSTURE: No Significant postural limitations  PALPATION: None today-- due to   LUMBAR ROM: Deferred due to recent compression fx AROM eval  Flexion   Extension   Right lateral flexion   Left lateral flexion   Right rotation   Left rotation    (Blank rows = not tested)  LOWER EXTREMITY ROM:   WFLs Active  Right eval Left eval  Hip flexion    Hip extension    Hip abduction    Hip adduction    Hip internal rotation    Hip external rotation    Knee flexion    Knee extension    Ankle dorsiflexion    Ankle plantarflexion    Ankle inversion    Ankle eversion     (Blank rows = not tested)  LOWER EXTREMITY MMT:  deferred due to pain-- able to do SLR  MMT Right eval Left eval  Hip flexion    Hip extension    Hip abduction    Hip adduction    Hip internal rotation    Hip external rotation    Knee flexion    Knee extension    Ankle dorsiflexion    Ankle plantarflexion    Ankle inversion    Ankle eversion     (Blank rows = not tested)  LUMBAR SPECIAL TESTS:  Deferred due to compression fx  FUNCTIONAL TESTS:  Log roll for bed mobility instructed and performed  GAIT: Distance walked: 20/13.16 seconds=1.52 ft/sec Assistive device utilized: None Level of assistance: Complete Independence Comments: slowed pace Pain after walking when she first sits down  [facility] Adult PT Treatment:                                                DATE: 09/30/23 Therapeutic Exercise: Nu-step  level 2 x 3 minutes for warm up Standing  Ls with red band x 10 reps  Supine Spinal decompression series chin tucks x 8, scapular retraction x 8, W x 8, hip extension x 8 bilat Head nods on coregous ball AROM supine with 2 pillows Manual Therapy: STM upper trap, scalenes and gentle cervical  traction Neuromuscular re-ed: Standing posture re-education with pool noodle along spine Standing anatomical position with scapular retraction Bent knee fallout for R and L sides for core stability Hooklying hip adduction with ball squeeze x 10 reps Hip adduction + beginner bridge x 10 reps without pain reports Therapeutic Activity: Sit<>stand x 5 reps with cues to reduce adduction Walking with arm swing facilitated to get transverse rotation of the pelvis Gait activities working on arm swing    [facility] Adult PT Treatment:                                                DATE: 09/27/2023 Therapeutic Exercise: Nustep L1 x 5 min Cervical nods/turns (small ROM) with ridge of skull on large noodle  Therapeutic Activity: sit to stands from mat +foam arms out in front with rising; at sides for slow eccentric descent (added to HEP) Neuromuscular re-ed: Seated in good posture: scapular retraction with ER x 10, rows x 15 red Tband Bent knee fall out Beginner bridge 2x10 Hooklying hip add with ball squeeze 10x5 --> towel roll placed for lumbar support Hooklying hip abd with green TB 2x10x5   [facility] Adult PT Treatment:                                                DATE: 09/21/2023 Therapeutic Exercise: Cervical nods/turns (small ROM) with ridge of skull on large noodle (dowel inside) Cervical nods/turns with head on deflated ball Diaphragmatic breathing with head on deflated ball Manual Therapy: STM Lt upper trapezius Pin & stretch Lt UT MWM proximal UT (Lt) (TPR) Neuromuscular re-ed: Bent knee fall out Beginner bridge 2x10 Hooklying hip add with ball squeeze 10x5 --> towel roll placed for lumbar support Hooklying hip abd with green TB 10x5     [facility] Adult PT Treatment:                                                DATE: 09/19/2023 Therapeutic Exercise: Supine diaphragmatic breathing SLR + folded towel underneath pelvis x6 (bil) --> SLR (no towel) x10 (bil) Log rolling for transitions  sit <--> supine Neuromuscular re-ed: Bent knee fall out x10 (bil) Hooklying hip add + ball b/w knees & breathing cues Unilateral 90/90 isometric hold Core marching  Beginner bridge  PATIENT EDUCATION:  Education details: HEP Person educated: Patient Education method: Programmer, multimedia, Facilities manager, and Handouts Education comprehension: verbalized understanding, returned demonstration, and needs further education  HOME EXERCISE PROGRAM: Access Code: [email or web address] URL: [email or web address] Date: 09/27/2023 Prepared by: Mliss  Program Notes THESE SHOULD NOT INCREASE PAIN. If so, wait until you return to therapy.  Exercises - Supine Active Straight Leg Raise  - 1 x daily - 5 x weekly - 1 sets - 10 reps - Heel  Raises with Counter Support  - 1 x daily - 5 x weekly - 1 sets - 10 reps - Supine Diaphragmatic Breathing  - 1 x daily - 7 x weekly - 3 sets - 10 reps - Supine March  - 1 x daily - 7 x weekly - 3 sets - 10 reps - Beginner Bridge  - 1 x daily - 7 x weekly - 3 sets - 10 reps - Sit to Stand Without Arm Support  - 2-3 x daily - 7 x weekly - 1 sets - 5 reps   ASSESSMENT: CLINICAL IMPRESSION:  Patient reporting ongoing neck pain. She is gaining ROM with HEP, but continues with pain. PT to continue to progress to STGs and LTGs with emphasis on reducing pain, improvng mobility, and strengthening for functional activities.   EVAL: Patient is an 83  y.o. female who was seen today for physical therapy evaluation and treatment for compression fx and low back pain. Her pain varies from 5/10 up to 10/10 and is typically worse in the morning x first hour of waking, coughing, and after sitting and transitioning to stand. Evaluation was limited due to high irritability and precautions due to fx. She presents with impairments in ROM, strength, pain, and overall functional mobility. PT recommends lumbar brace due to characteristics of pain during evaluation (winces with coughing and  sit<>stand)-- will reach out to request order.  GOALS: Goals reviewed with patient? Yes  SHORT TERM GOALS: Target date: 10/14/23   The patient will be indep with initial HEP Baseline: initiated at eval Goal status: INITIAL  2.  The patient will report pain at worst 6/10 Baseline:  10/10 Goal status: INITIAL  LONG TERM GOALS: Target date: 11/14/23   The patient will be indep with HEP progression. Baseline:  Initiated at eval Goal status: INITIAL  2.  The patient will improve modified oswestry score by 12% Baseline: see above Goal status: INITIAL  3.  The patient will improve gait speed to 2.3 ft/sec Baseline: 1.52 ft/sec Goal status: INITIAL  4.  The patient will improve pain to < or equal to 2/10 at rest. Baseline:  5-6/10 Goal status: INITIAL PLAN:  PT FREQUENCY: 2x/week  PT DURATION: 8 weeks  PLANNED INTERVENTIONS: 97164- PT Re-evaluation, 97750- Physical Performance Testing, 97110-Therapeutic exercises, 97530- Therapeutic activity, [id number]- Neuromuscular re-education, 97535- Self Care, [id number]- Manual therapy, [id number]- Gait training, [phone]- Aquatic Therapy, Patient/Family education, Balance training, Cryotherapy, and Moist heat.  PLAN FOR NEXT SESSION: Continue bracing at neutral, promote lumbar curve awareness, LE strengthening to tolerance, progress to patient tolerance, gait training, return to functional tasks with modifications and education re: osteoporosis.  Neck AROM and STM as needed. Check imaging results (not back), work on lumbar mobility once imaging received.   Danial Sisley, PT 09/30/2023, 8:44 AM

## 2023-10-03 ENCOUNTER — Ambulatory Visit: Admitting: Rehabilitative and Restorative Service Providers"

## 2023-10-03 ENCOUNTER — Encounter: Payer: Self-pay | Admitting: Rehabilitative and Restorative Service Providers"

## 2023-10-03 DIAGNOSIS — M6281 Muscle weakness (generalized): Secondary | ICD-10-CM

## 2023-10-03 DIAGNOSIS — M5459 Other low back pain: Secondary | ICD-10-CM

## 2023-10-03 NOTE — Therapy (Signed)
 OUTPATIENT PHYSICAL THERAPY THORACOLUMBAR TREATMENT  Patient Name: Christy Gutierrez MRN: [id number] DOB:[birth year], 83 y.o., female Today's Date: 10/03/2023  END OF SESSION:  PT End of Session - 10/03/23 1318     Visit Number 6    Number of Visits 16    Date for PT Re-Evaluation 11/13/23    Authorization Type aetna medicare    PT Start Time 1318    PT Stop Time 1400    PT Time Calculation (min) 42 min    Activity Tolerance Patient tolerated treatment well    Behavior During Therapy WFL for tasks assessed/performed          Past Medical History:  Diagnosis Date   Atrial fibrillation (HCC)    Back pain    Hypertension    Past Surgical History:  Procedure Laterality Date   SKIN CANCER EXCISION  05/2022   on the chest   TONSILLECTOMY     TUBAL LIGATION     bilateral   Patient Active Problem List   Diagnosis Date Noted   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   Atrial fibrillation (HCC) 10/06/2022   Near syncope 09/03/2020   Pulmonary nodule, right 06/26/2019   MDD (major depressive disorder), recurrent episode (HCC) 12/26/2018   Emphysema lung (HCC) 03/28/2018   Episodic lightheadedness 08/08/2017   Essential hypertension 03/05/2016   Lumbar spondylosis 04/02/2015   Left knee pain 04/02/2015   Migraine 05/08/2013   Osteoporosis 11/10/2009   Vitamin D  deficiency 11/04/2009   Hyperlipidemia 11/04/2009   Backache 11/04/2009   FATIGUE 11/04/2009   Asymptomatic postmenopausal status 11/04/2009    PCP: Alvan Craven MD REFERRING PROVIDER: Alvan Craven MD REFERRING DIAG:  Diagnosis  S32.020A (ICD-10-CM) - Compression fracture of L2 vertebra, initial encounter (HCC)   Rationale for Evaluation and Treatment: Rehabilitation  THERAPY DIAG:  Other low back pain  Muscle weakness (generalized)  ONSET DATE: 09/13/23   SUBJECTIVE:                                                                                                                                                                                           SUBJECTIVE STATEMENT: The patient reports she did a lot this weekend and her neck is sore. She got out her yard clippers to cut shrubs back in one spot.   EVAL: The patient reports that she had a fall on May 28 and hit her head and bumped her arm. 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 She is having significant pain that is constant in nature. She cannot get down  on the floor (she would get down to pray or exercise prior to this pain) and notes difficulty trying to get up.   PERTINENT HISTORY:  Osteoporosis, H/o back pain from early 90s. A-fib, emphysema, migraine, HTN.  PAIN:  Are you having pain? Yes: NPRS scale: neck 4/10 ; back is 3-4/10 Pain location: low back and neck Pain description: soreness, gets to be sharp pain and muscle spasms Aggravating factors: getting up after sitting Relieving factors: heating pad,   PRECAUTIONS: Back *precautions due to acute compression fx and osteoporosis  RED FLAGS: Compression fracture: Yes: fall on May 28 with pain beginning 2 weeks post fall   Bowel and bladder: denies issues Denies saddle paresthesias  WEIGHT BEARING RESTRICTIONS: No  FALLS:  Has patient fallen in last 6 months? Yes. Number of falls 1 time in May; she describes 2-3 over the past year  LIVING ENVIRONMENT: Lives with: lives alone Lives in: House/apartment Stairs: Yes: Internal: 12 to basement, 4-5 going into home;  steps; one rail Has following equipment at home: None  PLOF: Independent  PATIENT GOALS: reduce, walk upright, return to doing exercises  OBJECTIVE:  Note: Objective measures were completed at Evaluation unless otherwise noted.  DIAGNOSTIC FINDINGS:  IMPRESSION: 1. New mild compression deformity of L2, possibly acute. 2. Stable L1 compression deformity. 3. Moderate multilevel degenerative change.   PATIENT SURVEYS:   Modified Oswestry: 54%  COGNITION: Overall cognitive status: Within functional limits for tasks assessed     SENSATION: WFL  POSTURE: No Significant postural limitations  PALPATION: None today-- due to   LUMBAR ROM: Deferred due to recent compression fx AROM eval  Flexion   Extension   Right lateral flexion   Left lateral flexion   Right rotation   Left rotation    (Blank rows = not tested)  LOWER EXTREMITY ROM:   WFLs Active  Right eval Left eval  Hip flexion    Hip extension    Hip abduction    Hip adduction    Hip internal rotation    Hip external rotation    Knee flexion    Knee extension    Ankle dorsiflexion    Ankle plantarflexion    Ankle inversion    Ankle eversion     (Blank rows = not tested)  LOWER EXTREMITY MMT:  deferred due to pain-- able to do SLR  MMT Right eval Left eval  Hip flexion    Hip extension    Hip abduction    Hip adduction    Hip internal rotation    Hip external rotation    Knee flexion    Knee extension    Ankle dorsiflexion    Ankle plantarflexion    Ankle inversion    Ankle eversion     (Blank rows = not tested)  LUMBAR SPECIAL TESTS:  Deferred due to compression fx  FUNCTIONAL TESTS:  Log roll for bed mobility instructed and performed  GAIT: Distance walked: 20/13.16 seconds=1.52 ft/sec Assistive device utilized: None Level of assistance: Complete Independence Comments: slowed pace Pain after walking when she first sits down  [facility] Adult PT Treatment:                                                DATE: 10/03/23 Therapeutic Exercise: Supine Horizontal abduction bilateral Ues Marching with isometric holds x 5 reps Spinal decompression  series chin tucks x 8, scapular retraction x 8, W x 8 Head nods on coregous ball AROM supine with 2 pillows Manual Therapy: STM suboccipital release STM scalenes, upper trap Isometrics with contract/relax cervical spine Neuromuscular re-ed: Standing core engagement  pressing into physioball x 6 reps x 5 seconds holds Posture re-ed working on rocking small ROM posterior/anterior pelvic tilt in sitting position Therapeutic Activity: TA activation  R leg 90/90 to SLR x 8 reps and then repeat on L Marching with TA activation Bent knee fallouts  [facility] Adult PT Treatment:                                                DATE: 09/30/23 Therapeutic Exercise: Nu-step level 2 x 3 minutes for warm up Standing  Ls with red band x 10 reps  Supine Spinal decompression series chin tucks x 8, scapular retraction x 8, W x 8, hip extension x 8 bilat Head nods on coregous ball AROM supine with 2 pillows Manual Therapy: STM upper trap, scalenes and gentle cervical traction Neuromuscular re-ed: Standing posture re-education with pool noodle along spine Standing anatomical position with scapular retraction Bent knee fallout for R and L sides for core stability Hooklying hip adduction with ball squeeze x 10 reps Hip adduction + beginner bridge x 10 reps without pain reports Therapeutic Activity: Sit<>stand x 5 reps with cues to reduce adduction Walking with arm swing facilitated to get transverse rotation of the pelvis Gait activities working on arm swing    [facility] Adult PT Treatment:                                                DATE: 09/27/2023 Therapeutic Exercise: Nustep L1 x 5 min Cervical nods/turns (small ROM) with ridge of skull on large noodle  Therapeutic Activity: sit to stands from mat +foam arms out in front with rising; at sides for slow eccentric descent (added to HEP) Neuromuscular re-ed: Seated in good posture: scapular retraction with ER x 10, rows x 15 red Tband Bent knee fall out Beginner bridge 2x10 Hooklying hip add with ball squeeze 10x5 --> towel roll placed for lumbar support Hooklying hip abd with green TB 2x10x5   [facility] Adult PT Treatment:                                                DATE: 09/21/2023 Therapeutic Exercise: Cervical  nods/turns (small ROM) with ridge of skull on large noodle (dowel inside) Cervical nods/turns with head on deflated ball Diaphragmatic breathing with head on deflated ball Manual Therapy: STM Lt upper trapezius Pin & stretch Lt UT MWM proximal UT (Lt) (TPR) Neuromuscular re-ed: Bent knee fall out Beginner bridge 2x10 Hooklying hip add with ball squeeze 10x5 --> towel roll placed for lumbar support Hooklying hip abd with green TB 10x5     [facility] Adult PT Treatment:  DATE: 09/19/2023 Therapeutic Exercise: Supine diaphragmatic breathing SLR + folded towel underneath pelvis x6 (bil) --> SLR (no towel) x10 (bil) Log rolling for transitions sit <--> supine Neuromuscular re-ed: Bent knee fall out x10 (bil) Hooklying hip add + ball b/w knees & breathing cues Unilateral 90/90 isometric hold Core marching  Beginner bridge  PATIENT EDUCATION:  Education details: HEP Person educated: Patient Education method: Programmer, multimedia, Facilities manager, and Handouts Education comprehension: verbalized understanding, returned demonstration, and needs further education  HOME EXERCISE PROGRAM: Access Code: [email or web address] URL: [email or web address] Date: 09/27/2023 Prepared by: Mliss  Program Notes THESE SHOULD NOT INCREASE PAIN. If so, wait until you return to therapy.  Exercises - Supine Active Straight Leg Raise  - 1 x daily - 5 x weekly - 1 sets - 10 reps - Heel Raises with Counter Support  - 1 x daily - 5 x weekly - 1 sets - 10 reps - Supine Diaphragmatic Breathing  - 1 x daily - 7 x weekly - 3 sets - 10 reps - Supine March  - 1 x daily - 7 x weekly - 3 sets - 10 reps - Beginner Bridge  - 1 x daily - 7 x weekly - 3 sets - 10 reps - Sit to Stand Without Arm Support  - 2-3 x daily - 7 x weekly - 1 sets - 5 reps   ASSESSMENT: CLINICAL IMPRESSION: The patient is improving with neck ROM today, especially to L. She still has acute neck pain and  feels like it reduced with therapy last Friday, but then she overdid it over the weekend. PT and patient discussed continuing current HEP and will add more neck activities as appropriate.   EVAL: Patient is an 83  y.o. female who was seen today for physical therapy evaluation and treatment for compression fx and low back pain. Her pain varies from 5/10 up to 10/10 and is typically worse in the morning x first hour of waking, coughing, and after sitting and transitioning to stand. Evaluation was limited due to high irritability and precautions due to fx. She presents with impairments in ROM, strength, pain, and overall functional mobility. PT recommends lumbar brace due to characteristics of pain during evaluation (winces with coughing and sit<>stand)-- will reach out to request order.  GOALS: Goals reviewed with patient? Yes  SHORT TERM GOALS: Target date: 10/14/23   The patient will be indep with initial HEP Baseline: initiated at eval Goal status: INITIAL  2.  The patient will report pain at worst 6/10 Baseline:  10/10 Goal status: INITIAL  LONG TERM GOALS: Target date: 11/14/23   The patient will be indep with HEP progression. Baseline:  Initiated at eval Goal status: INITIAL  2.  The patient will improve modified oswestry score by 12% Baseline: see above Goal status: INITIAL  3.  The patient will improve gait speed to 2.3 ft/sec Baseline: 1.52 ft/sec Goal status: INITIAL  4.  The patient will improve pain to < or equal to 2/10 at rest. Baseline:  5-6/10 Goal status: INITIAL PLAN:  PT FREQUENCY: 2x/week  PT DURATION: 8 weeks  PLANNED INTERVENTIONS: [id number]- PT Re-evaluation, 97750- Physical Performance Testing, 97110-Therapeutic exercises, 97530- Therapeutic activity, [id number]- Neuromuscular re-education, 97535- Self Care, 02859- Manual therapy, [id number]- Gait training, [phone]- Aquatic Therapy, Patient/Family education, Balance training, Cryotherapy, and Moist heat.  PLAN FOR NEXT  SESSION: Continue bracing at neutral, promote lumbar curve awareness, LE strengthening to tolerance, progress to patient tolerance, gait training, return to functional tasks with modifications and education re:  osteoporosis.  Neck AROM and STM as needed. Consider updating HeP to add more c-spine activities.  Keyuana Wank, PT 10/03/2023, 1:19 PM

## 2023-10-05 NOTE — Telephone Encounter (Signed)
 Our protocol states that anticoagulation does not need to be held for single dental extractions

## 2023-10-05 NOTE — Telephone Encounter (Signed)
    Primary Cardiologist: None  Chart reviewed as part of pre-operative protocol coverage. Simple dental extractions are considered low risk procedures per guidelines and generally do not require any specific cardiac clearance. It is also generally accepted that for simple extractions and dental cleanings, there is no need to interrupt blood thinner therapy.   SBE prophylaxis is not required for the patient.  I will route this recommendation to the requesting party via Epic fax function and remove from pre-op pool.  Please call with questions.  Orren LOISE Fabry, PA-C 10/05/2023, 4:44 PM

## 2023-10-06 ENCOUNTER — Ambulatory Visit: Admitting: Rehabilitative and Restorative Service Providers"

## 2023-10-06 ENCOUNTER — Encounter: Payer: Self-pay | Admitting: Rehabilitative and Restorative Service Providers"

## 2023-10-06 DIAGNOSIS — M5459 Other low back pain: Secondary | ICD-10-CM

## 2023-10-06 DIAGNOSIS — M6281 Muscle weakness (generalized): Secondary | ICD-10-CM

## 2023-10-06 NOTE — Telephone Encounter (Signed)
 Attempted to reach surgeons office to answer a question they left for our office. No answer and could not leave a message. Will try again later.

## 2023-10-06 NOTE — Telephone Encounter (Signed)
 Office needing clarification on Clearance. Please advise

## 2023-10-06 NOTE — Therapy (Signed)
 OUTPATIENT PHYSICAL THERAPY THORACOLUMBAR TREATMENT  Patient Name: Christy Gutierrez MRN: [id number] DOB:04-Jul-1940, 83 y.o., female Today's Date: 10/06/2023  END OF SESSION:  PT End of Session - 10/06/23 1312     Visit Number 7    Number of Visits 16    Date for PT Re-Evaluation 11/13/23    Authorization Type aetna medicare    PT Start Time 1316    PT Stop Time 1400    PT Time Calculation (min) 44 min    Activity Tolerance Patient tolerated treatment well    Behavior During Therapy WFL for tasks assessed/performed          Past Medical History:  Diagnosis Date   Atrial fibrillation (HCC)    Back pain    Hypertension    Past Surgical History:  Procedure Laterality Date   SKIN CANCER EXCISION  05/2022   on the chest   TONSILLECTOMY     TUBAL LIGATION     bilateral   Patient Active Problem List   Diagnosis Date Noted   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   Atrial fibrillation (HCC) 10/06/2022   Near syncope 09/03/2020   Pulmonary nodule, right 06/26/2019   MDD (major depressive disorder), recurrent episode (HCC) 12/26/2018   Emphysema lung (HCC) 03/28/2018   Episodic lightheadedness 08/08/2017   Essential hypertension 03/05/2016   Lumbar spondylosis 04/02/2015   Left knee pain 04/02/2015   Migraine 05/08/2013   Osteoporosis 11/10/2009   Vitamin D  deficiency 11/04/2009   Hyperlipidemia 11/04/2009   Backache 11/04/2009   FATIGUE 11/04/2009   Asymptomatic postmenopausal status 11/04/2009    PCP: Alvan Craven MD REFERRING PROVIDER: Alvan Craven MD REFERRING DIAG:  Diagnosis  S32.020A (ICD-10-CM) - Compression fracture of L2 vertebra, initial encounter (HCC)   Rationale for Evaluation and Treatment: Rehabilitation  THERAPY DIAG:  Other low back pain  Muscle weakness (generalized)  ONSET DATE: 09/13/23   SUBJECTIVE:                                                                                                                                                                                           SUBJECTIVE STATEMENT: The patient reports neck pain continues to be worse than back pain. It just feels like everything is really tense in my neck. She feels like her muscles are on guard.   EVAL: The patient reports that she had a fall on May 28 and hit her head and bumped her arm. 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 She is having significant pain that is constant in nature.  She cannot get down on the floor (she would get down to pray or exercise prior to this pain) and notes difficulty trying to get up.   PERTINENT HISTORY:  Osteoporosis, H/o back pain from early 90s. A-fib, emphysema, migraine, HTN.  PAIN:  Are you having pain? Yes: NPRS scale: neck 6/10 ; back is 3-4/10 Pain location: low back and neck Pain description: soreness, gets to be sharp pain and muscle spasms Aggravating factors: getting up after sitting Relieving factors: heating pad,   PRECAUTIONS: Back *precautions due to acute compression fx and osteoporosis  RED FLAGS: Compression fracture: Yes: fall on May 28 with pain beginning 2 weeks post fall   Bowel and bladder: denies issues Denies saddle paresthesias  WEIGHT BEARING RESTRICTIONS: No  FALLS:  Has patient fallen in last 6 months? Yes. Number of falls 1 time in May; she describes 2-3 over the past year  LIVING ENVIRONMENT: Lives with: lives alone Lives in: House/apartment Stairs: Yes: Internal: 12 to basement, 4-5 going into home;  steps; one rail Has following equipment at home: None  PLOF: Independent  PATIENT GOALS: reduce, walk upright, return to doing exercises  OBJECTIVE:  Note: Objective measures were completed at Evaluation unless otherwise noted.  DIAGNOSTIC FINDINGS:  IMPRESSION: 1. New mild compression deformity of L2, possibly acute. 2. Stable L1 compression deformity. 3. Moderate multilevel  degenerative change.   PATIENT SURVEYS:  Modified Oswestry: 54%  COGNITION: Overall cognitive status: Within functional limits for tasks assessed     SENSATION: WFL  POSTURE: No Significant postural limitations  PALPATION: None today-- due to   LUMBAR ROM: Deferred due to recent compression fx AROM eval  Flexion   Extension   Right lateral flexion   Left lateral flexion   Right rotation   Left rotation    (Blank rows = not tested)  LOWER EXTREMITY ROM:   WFLs Active  Right eval Left eval  Hip flexion    Hip extension    Hip abduction    Hip adduction    Hip internal rotation    Hip external rotation    Knee flexion    Knee extension    Ankle dorsiflexion    Ankle plantarflexion    Ankle inversion    Ankle eversion     (Blank rows = not tested)  LOWER EXTREMITY MMT:  deferred due to pain-- able to do SLR  MMT Right eval Left eval  Hip flexion    Hip extension    Hip abduction    Hip adduction    Hip internal rotation    Hip external rotation    Knee flexion    Knee extension    Ankle dorsiflexion    Ankle plantarflexion    Ankle inversion    Ankle eversion     (Blank rows = not tested)  LUMBAR SPECIAL TESTS:  Deferred due to compression fx  FUNCTIONAL TESTS:  Log roll for bed mobility instructed and performed  GAIT: Distance walked: 20/13.16 seconds=1.52 ft/sec Assistive device utilized: None Level of assistance: Complete Independence Comments: slowed pace Pain after walking when she first sits down   [facility] Adult PT Treatment:                                                DATE: 10/06/23 Therapeutic Exercise: Sidelying Open book x 10 reps R SL-- unable  to tolerate L SL due to hip pain Supine Horizontal abduction x 10 reps AROM Neck AROM Seated Hamstring stretch R and L x 30 seconds each  Manual Therapy: STM suboccipitals PROM into flexion with rotation Gentle manual distraction Isometrics contract/relax cervical spine rotation +  sidebending x 3 second holds x 5 reps Neuromuscular re-ed: Core activation  Standing reaching overhead with ball squeeze and core engagement Seated on disc with postural cues with breathing in with overhead reach and exhalation with arms down Standing anterior/posterior pelvic tilt   [facility] Adult PT Treatment:                                                DATE: 10/03/23 Therapeutic Exercise: Supine Horizontal abduction bilateral Ues Marching with isometric holds x 5 reps Spinal decompression series chin tucks x 8, scapular retraction x 8, W x 8 Head nods on coregous ball AROM supine with 2 pillows Manual Therapy: STM suboccipital release STM scalenes, upper trap Isometrics with contract/relax cervical spine Neuromuscular re-ed: Standing core engagement pressing into physioball x 6 reps x 5 seconds holds Posture re-ed working on rocking small ROM posterior/anterior pelvic tilt in sitting position Therapeutic Activity: TA activation  R leg 90/90 to SLR x 8 reps and then repeat on L Marching with TA activation Bent knee fallouts  [facility] Adult PT Treatment:                                                DATE: 09/30/23 Therapeutic Exercise: Nu-step level 2 x 3 minutes for warm up Standing  Ls with red band x 10 reps  Supine Spinal decompression series chin tucks x 8, scapular retraction x 8, W x 8, hip extension x 8 bilat Head nods on coregous ball AROM supine with 2 pillows Manual Therapy: STM upper trap, scalenes and gentle cervical traction Neuromuscular re-ed: Standing posture re-education with pool noodle along spine Standing anatomical position with scapular retraction Bent knee fallout for R and L sides for core stability Hooklying hip adduction with ball squeeze x 10 reps Hip adduction + beginner bridge x 10 reps without pain reports Therapeutic Activity: Sit<>stand x 5 reps with cues to reduce adduction Walking with arm swing facilitated to get transverse rotation of the  pelvis Gait activities working on arm swing    [facility] Adult PT Treatment:                                                DATE: 09/27/2023 Therapeutic Exercise: Nustep L1 x 5 min Cervical nods/turns (small ROM) with ridge of skull on large noodle  Therapeutic Activity: sit to stands from mat +foam arms out in front with rising; at sides for slow eccentric descent (added to HEP) Neuromuscular re-ed: Seated in good posture: scapular retraction with ER x 10, rows x 15 red Tband Bent knee fall out Beginner bridge 2x10 Hooklying hip add with ball squeeze 10x5 --> towel roll placed for lumbar support Hooklying hip abd with green TB 2x10x5   PATIENT EDUCATION:  Education details: HEP Person educated: Patient Education method: Programmer, multimedia, Demonstration,  and Handouts Education comprehension: verbalized understanding, returned demonstration, and needs further education  HOME EXERCISE PROGRAM: Access Code: [email or web address] URL: [email or web address] Date: 10/06/2023 Prepared by: Tawni Ferrier  Program Notes THESE SHOULD NOT INCREASE PAIN. If so, wait until you return to therapy.  Exercises - Supine Active Straight Leg Raise  - 1 x daily - 5 x weekly - 1 sets - 10 reps - Heel Raises with Counter Support  - 1 x daily - 5 x weekly - 1 sets - 10 reps - Supine Diaphragmatic Breathing  - 1 x daily - 7 x weekly - 3 sets - 10 reps - Supine March  - 1 x daily - 7 x weekly - 3 sets - 10 reps - Beginner Bridge  - 1 x daily - 7 x weekly - 3 sets - 10 reps - Sit to Stand Without Arm Support  - 2-3 x daily - 7 x weekly - 1 sets - 5 reps - Seated Cervical Flexion AROM  - 1 x daily - 7 x weekly - 1 sets - 10 reps - Supine Cervical Rotation AROM on Pillow  - 1 x daily - 7 x weekly - 1 sets - 10 reps - Seated Isometric Cervical Sidebending  - 2 x daily - 7 x weekly - 1 sets - 10 reps   ASSESSMENT: CLINICAL IMPRESSION: PT added gentle neck exercises to HEP and also worked on core activation  within pain free ROM for low back. Patient fatigues easily in standing with postural re-ed. Patient's neck continues to be more limiting than her back. Plan to continue to progress to patient tolerance.  EVAL: Patient is an 83  y.o. female who was seen today for physical therapy evaluation and treatment for compression fx and low back pain. Her pain varies from 5/10 up to 10/10 and is typically worse in the morning x first hour of waking, coughing, and after sitting and transitioning to stand. Evaluation was limited due to high irritability and precautions due to fx. She presents with impairments in ROM, strength, pain, and overall functional mobility. PT recommends lumbar brace due to characteristics of pain during evaluation (winces with coughing and sit<>stand)-- will reach out to request order.  GOALS: Goals reviewed with patient? Yes  SHORT TERM GOALS: Target date: 10/14/23   The patient will be indep with initial HEP Baseline: initiated at eval Goal status: INITIAL  2.  The patient will report pain at worst 6/10 Baseline:  10/10 Goal status: MET-- back pain at worst is 5/10  LONG TERM GOALS: Target date: 11/14/23   The patient will be indep with HEP progression. Baseline:  Initiated at eval Goal status: INITIAL  2.  The patient will improve modified oswestry score by 12% Baseline: see above Goal status: INITIAL  3.  The patient will improve gait speed to 2.3 ft/sec Baseline: 1.52 ft/sec Goal status: INITIAL  4.  The patient will improve pain to < or equal to 2/10 at rest. Baseline:  5-6/10 Goal status: INITIAL PLAN:  PT FREQUENCY: 2x/week  PT DURATION: 8 weeks  PLANNED INTERVENTIONS: 97164- PT Re-evaluation, 97750- Physical Performance Testing, 97110-Therapeutic exercises, 97530- Therapeutic activity, [id number]- Neuromuscular re-education, 97535- Self Care, [id number]- Manual therapy, [id number]- Gait training, [phone]- Aquatic Therapy, Patient/Family education, Balance training,  Cryotherapy, and Moist heat.  PLAN FOR NEXT SESSION: Continue bracing at neutral, promote lumbar curve awareness, LE strengthening to tolerance, progress to patient tolerance, gait training, return to functional tasks with modifications and education re: osteoporosis.  Neck AROM  and STM as needed.   Emmagrace Runkel, PT 10/06/2023, 1:13 PM

## 2023-10-10 ENCOUNTER — Ambulatory Visit: Payer: Medicare HMO | Admitting: Family Medicine

## 2023-10-10 NOTE — Telephone Encounter (Signed)
 2nd request received: Called surgeons office, they needed clarification on if the Eliguis needed to be held I read the clearance and informed the surgeons office of the following: Per clearance: It is also generally accepted that for simple extractions and dental cleanings, there is no need to interrupt blood thinner therapy.    I will fax over the clearance to the surgeons office.

## 2023-10-11 ENCOUNTER — Ambulatory Visit: Payer: Medicare HMO | Admitting: Family Medicine

## 2023-10-11 ENCOUNTER — Ambulatory Visit

## 2023-10-11 DIAGNOSIS — M5459 Other low back pain: Secondary | ICD-10-CM

## 2023-10-11 DIAGNOSIS — M6281 Muscle weakness (generalized): Secondary | ICD-10-CM

## 2023-10-11 NOTE — Therapy (Signed)
 OUTPATIENT PHYSICAL THERAPY THORACOLUMBAR TREATMENT  Patient Name: Christy Gutierrez MRN: [id number] DOB:1941-02-15, 83 y.o., female Today's Date: 10/11/2023  END OF SESSION:  PT End of Session - 10/11/23 1103     Visit Number 8    Number of Visits 16    Date for PT Re-Evaluation 11/13/23    Authorization Type aetna medicare    Progress Note Due on Visit 10    PT Start Time 1103    PT Stop Time 1143    PT Time Calculation (min) 40 min    Activity Tolerance Patient tolerated treatment well    Behavior During Therapy WFL for tasks assessed/performed         Past Medical History:  Diagnosis Date   Atrial fibrillation (HCC)    Back pain    Hypertension    Past Surgical History:  Procedure Laterality Date   SKIN CANCER EXCISION  05/2022   on the chest   TONSILLECTOMY     TUBAL LIGATION     bilateral   Patient Active Problem List   Diagnosis Date Noted   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   Atrial fibrillation (HCC) 10/06/2022   Near syncope 09/03/2020   Pulmonary nodule, right 06/26/2019   MDD (major depressive disorder), recurrent episode (HCC) 12/26/2018   Emphysema lung (HCC) 03/28/2018   Episodic lightheadedness 08/08/2017   Essential hypertension 03/05/2016   Lumbar spondylosis 04/02/2015   Left knee pain 04/02/2015   Migraine 05/08/2013   Osteoporosis 11/10/2009   Vitamin D  deficiency 11/04/2009   Hyperlipidemia 11/04/2009   Backache 11/04/2009   FATIGUE 11/04/2009   Asymptomatic postmenopausal status 11/04/2009    PCP: Alvan Craven MD REFERRING PROVIDER: Alvan Craven MD REFERRING DIAG:  Diagnosis  S32.020A (ICD-10-CM) - Compression fracture of L2 vertebra, initial encounter (HCC)   Rationale for Evaluation and Treatment: Rehabilitation  THERAPY DIAG:  Other low back pain  Muscle weakness (generalized)  ONSET DATE: 09/13/23   SUBJECTIVE:                                                                                                                                                                                           SUBJECTIVE STATEMENT: Patient reports her back is feeling better and has more mobility but her neck continues to bother her, states 3/10 pain always on the left. Patient states she has noticed that her endurance is low and fatigues quickly when walking long distances, such as walking into church; she also feels unsteady when around a lot of people, I have no confidence in my balance and I don't know why.  EVAL: The patient reports that she had a fall  on May 28 and hit her head and bumped her arm. 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 She is having significant pain that is constant in nature. She cannot get down on the floor (she would get down to pray or exercise prior to this pain) and notes difficulty trying to get up.   PERTINENT HISTORY:  Osteoporosis, H/o back pain from early 90s. A-fib, emphysema, migraine, HTN.  PAIN:  Are you having pain? Yes: NPRS scale: neck 6/10 ; back is 3-4/10 Pain location: low back and neck Pain description: soreness, gets to be sharp pain and muscle spasms Aggravating factors: getting up after sitting Relieving factors: heating pad,   PRECAUTIONS: Back *precautions due to acute compression fx and osteoporosis  RED FLAGS: Compression fracture: Yes: fall on May 28 with pain beginning 2 weeks post fall   Bowel and bladder: denies issues Denies saddle paresthesias  WEIGHT BEARING RESTRICTIONS: No  FALLS:  Has patient fallen in last 6 months? Yes. Number of falls 1 time in May; she describes 2-3 over the past year  LIVING ENVIRONMENT: Lives with: lives alone Lives in: House/apartment Stairs: Yes: Internal: 12 to basement, 4-5 going into home;  steps; one rail Has following equipment at home: None  PLOF: Independent  PATIENT GOALS: reduce, walk upright, return to doing  exercises  OBJECTIVE:  Note: Objective measures were completed at Evaluation unless otherwise noted.  DIAGNOSTIC FINDINGS:  IMPRESSION: 1. New mild compression deformity of L2, possibly acute. 2. Stable L1 compression deformity. 3. Moderate multilevel degenerative change.   PATIENT SURVEYS:  Modified Oswestry: 54%  COGNITION: Overall cognitive status: Within functional limits for tasks assessed     SENSATION: WFL  POSTURE: No Significant postural limitations  PALPATION: None today-- due to   LUMBAR ROM: Deferred due to recent compression fx AROM eval  Flexion   Extension   Right lateral flexion   Left lateral flexion   Right rotation   Left rotation    (Blank rows = not tested)  LOWER EXTREMITY ROM:   WFLs Active  Right eval Left eval  Hip flexion    Hip extension    Hip abduction    Hip adduction    Hip internal rotation    Hip external rotation    Knee flexion    Knee extension    Ankle dorsiflexion    Ankle plantarflexion    Ankle inversion    Ankle eversion     (Blank rows = not tested)  LOWER EXTREMITY MMT:  deferred due to pain-- able to do SLR  MMT Right eval Left eval  Hip flexion    Hip extension    Hip abduction    Hip adduction    Hip internal rotation    Hip external rotation    Knee flexion    Knee extension    Ankle dorsiflexion    Ankle plantarflexion    Ankle inversion    Ankle eversion     (Blank rows = not tested)  LUMBAR SPECIAL TESTS:  Deferred due to compression fx  FUNCTIONAL TESTS:  Log roll for bed mobility instructed and performed  GAIT: Distance walked: 20/13.16 seconds=1.52 ft/sec Assistive device utilized: None Level of assistance: Complete Independence Comments: slowed pace Pain after walking when she first sits down   [facility] Adult PT Treatment:  DATE: 10/11/2023 Manual Therapy: STM Lt upper trap & levator, cervical paraspinals, suboccipitals  Neuromuscular  re-ed: Seated with towel roll vertical along spine: Rows (no resistance) Cactus arm open book chest opener --> tactile assist for cervical elongation/extension Cervical isometrics --> lateral flexion (bil) & extension 2x5x5 each Therapeutic Activity: Supine with towel roll horizontal at thoracic + shoulder flexion with dowel --> added breathing cues Scapula retraction with chest lift --> seated with towel roll vertical along spine Self Care: Sitting towel roll along spine for postural support   [facility] Adult PT Treatment:                                                DATE: 10/06/23 Therapeutic Exercise: Sidelying Open book x 10 reps R SL-- unable to tolerate L SL due to hip pain Supine Horizontal abduction x 10 reps AROM Neck AROM Seated Hamstring stretch R and L x 30 seconds each  Manual Therapy: STM suboccipitals PROM into flexion with rotation Gentle manual distraction Isometrics contract/relax cervical spine rotation + sidebending x 3 second holds x 5 reps Neuromuscular re-ed: Core activation  Standing reaching overhead with ball squeeze and core engagement Seated on disc with postural cues with breathing in with overhead reach and exhalation with arms down Standing anterior/posterior pelvic tilt   [facility] Adult PT Treatment:                                                DATE: 10/03/23 Therapeutic Exercise: Supine Horizontal abduction bilateral Ues Marching with isometric holds x 5 reps Spinal decompression series chin tucks x 8, scapular retraction x 8, W x 8 Head nods on coregous ball AROM supine with 2 pillows Manual Therapy: STM suboccipital release STM scalenes, upper trap Isometrics with contract/relax cervical spine Neuromuscular re-ed: Standing core engagement pressing into physioball x 6 reps x 5 seconds holds Posture re-ed working on rocking small ROM posterior/anterior pelvic tilt in sitting position Therapeutic Activity: TA activation  R leg 90/90 to SLR x 8  reps and then repeat on L Marching with TA activation Bent knee fallouts   PATIENT EDUCATION:  Education details: HEP Person educated: Patient Education method: Programmer, multimedia, Facilities manager, and Handouts Education comprehension: verbalized understanding, returned demonstration, and needs further education  HOME EXERCISE PROGRAM: Access Code: [email or web address] URL: [email or web address] Date: 10/06/2023 Prepared by: Tawni Ferrier  Program Notes THESE SHOULD NOT INCREASE PAIN. If so, wait until you return to therapy.  Exercises - Supine Active Straight Leg Raise  - 1 x daily - 5 x weekly - 1 sets - 10 reps - Heel Raises with Counter Support  - 1 x daily - 5 x weekly - 1 sets - 10 reps - Supine Diaphragmatic Breathing  - 1 x daily - 7 x weekly - 3 sets - 10 reps - Supine March  - 1 x daily - 7 x weekly - 3 sets - 10 reps - Beginner Bridge  - 1 x daily - 7 x weekly - 3 sets - 10 reps - Sit to Stand Without Arm Support  - 2-3 x daily - 7 x weekly - 1 sets - 5 reps - Seated Cervical Flexion AROM  - 1 x daily - 7 x weekly -  1 sets - 10 reps - Supine Cervical Rotation AROM on Pillow  - 1 x daily - 7 x weekly - 1 sets - 10 reps - Seated Isometric Cervical Sidebending  - 2 x daily - 7 x weekly - 1 sets - 10 reps   ASSESSMENT: CLINICAL IMPRESSION: Gentle thoracic extension mobility incorporated in supine and sitting to promote overall spinal mobility. Neck symptoms alleviated with cervical isometrics. Tightness noted along suboccipitals and proximal cervical paraspinals on Lt side of neck with palpitation. Improved cervical mobility demonstrated at end of session, especially with rotation. Patient would benefit from more standing exercises to progress endurance and stamina for improved balance and postural stability.   EVAL: Patient is an 83  y.o. female who was seen today for physical therapy evaluation and treatment for compression fx and low back pain. Her pain varies from 5/10 up  to 10/10 and is typically worse in the morning x first hour of waking, coughing, and after sitting and transitioning to stand. Evaluation was limited due to high irritability and precautions due to fx. She presents with impairments in ROM, strength, pain, and overall functional mobility. PT recommends lumbar brace due to characteristics of pain during evaluation (winces with coughing and sit<>stand)-- will reach out to request order.  GOALS: Goals reviewed with patient? Yes  SHORT TERM GOALS: Target date: 10/14/23   The patient will be indep with initial HEP Baseline: initiated at eval Goal status: MET  2.  The patient will report pain at worst 6/10 Baseline:  10/10 Goal status: MET-- back pain at worst is 5/10  LONG TERM GOALS: Target date: 11/14/23   The patient will be indep with HEP progression. Baseline:  Initiated at eval Goal status: INITIAL  2.  The patient will improve modified oswestry score by 12% Baseline: see above Goal status: INITIAL  3.  The patient will improve gait speed to 2.3 ft/sec Baseline: 1.52 ft/sec Goal status: INITIAL  4.  The patient will improve pain to < or equal to 2/10 at rest. Baseline:  5-6/10 Goal status: INITIAL PLAN:  PT FREQUENCY: 2x/week  PT DURATION: 8 weeks  PLANNED INTERVENTIONS: [id number]- PT Re-evaluation, 97750- Physical Performance Testing, 97110-Therapeutic exercises, 97530- Therapeutic activity, [id number]- Neuromuscular re-education, 97535- Self Care, [id number]- Manual therapy, [id number]- Gait training, [phone]- Aquatic Therapy, Patient/Family education, Balance training, Cryotherapy, and Moist heat.  PLAN FOR NEXT SESSION: More standing exercises to work on endurance, balance activities, promote lumbar curve awareness, LE strengthening to tolerance, progress to patient tolerance, gait training, return to functional tasks with modifications and education re: osteoporosis. Neck AROM and STM as needed.   Lamarr GORMAN Price, PTA 10/11/2023, 11:50  AM

## 2023-10-13 ENCOUNTER — Ambulatory Visit

## 2023-10-13 DIAGNOSIS — M5459 Other low back pain: Secondary | ICD-10-CM | POA: Diagnosis not present

## 2023-10-13 DIAGNOSIS — M6281 Muscle weakness (generalized): Secondary | ICD-10-CM

## 2023-10-13 NOTE — Telephone Encounter (Signed)
 Patient with diagnosis of atrial fibrillation on Eliquis  for anticoagulation.    Procedure: surgical extraction of 1 tooth Date of procedure: TBD   CHA2DS2-VASc Score = 4   This indicates a 4.8% annual risk of stroke. The patient's score is based upon: CHF History: 0 HTN History: 1 Diabetes History: 0 Stroke History: 0 Vascular Disease History: 0 Age Score: 2 Gender Score: 1   CrCl 59 Platelet count 187  Patient has not had an Afib/aflutter ablation within the last 3 months or DCCV within the last 30 days  Patient does not require pre-op antibiotics for dental procedure.  Per office protocol, patient can hold Eliquis  for 1 days prior to procedure.   Patient will not need bridging with Lovenox (enoxaparin) around procedure.  **This guidance is not considered finalized until pre-operative APP has relayed final recommendations.**

## 2023-10-13 NOTE — Telephone Encounter (Signed)
   Patient Name: Christy Gutierrez  DOB: 1940/03/11 MRN: [id number]  Primary Cardiologist: None  Clinical pharmacists have reviewed the patient's past medical history, labs, and current medications as part of preoperative protocol coverage. The following recommendations have been made:  Patient with diagnosis of atrial fibrillation on Eliquis  for anticoagulation.     Procedure: surgical extraction of 1 tooth Date of procedure: TBD     CHA2DS2-VASc Score = 4   This indicates a 4.8% annual risk of stroke. The patient's score is based upon: CHF History: 0 HTN History: 1 Diabetes History: 0 Stroke History: 0 Vascular Disease History: 0 Age Score: 2 Gender Score: 1   CrCl 59 Platelet count 187   Patient has not had an Afib/aflutter ablation within the last 3 months or DCCV within the last 30 days   Patient does not require pre-op antibiotics for dental procedure.   Per office protocol, patient can hold Eliquis  for 1 days prior to procedure.   Patient will not need bridging with Lovenox (enoxaparin) around procedure. Please resume Eliquis  as soon as possible postprocedure, at the discretion of the surgeon.   I will route this recommendation to the requesting party via Epic fax function and remove from pre-op pool.  Please call with questions.  Damien JAYSON Braver, NP 10/13/2023, 11:33 AM

## 2023-10-13 NOTE — Therapy (Signed)
 OUTPATIENT PHYSICAL THERAPY THORACOLUMBAR TREATMENT  Patient Name: Christy Gutierrez MRN: [id number] DOB:11-24-1940, 83 y.o., female Today's Date: 10/13/2023  END OF SESSION:  PT End of Session - 10/13/23 1148     Visit Number 9    Number of Visits 16    Date for PT Re-Evaluation 11/13/23    Authorization Type [employer] medicare    Progress Note Due on Visit 10    PT Start Time 1148    PT Stop Time 1234    PT Time Calculation (min) 46 min    Activity Tolerance Patient tolerated treatment well    Behavior During Therapy WFL for tasks assessed/performed         Past Medical History:  Diagnosis Date   Atrial fibrillation (HCC)    Back pain    Hypertension    Past Surgical History:  Procedure Laterality Date   SKIN CANCER EXCISION  05/2022   on the chest   TONSILLECTOMY     TUBAL LIGATION     bilateral   Patient Active Problem List   Diagnosis Date Noted   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   Atrial fibrillation (HCC) 10/06/2022   Near syncope 09/03/2020   Pulmonary nodule, right 06/26/2019   MDD (major depressive disorder), recurrent episode (HCC) 12/26/2018   Emphysema lung (HCC) 03/28/2018   Episodic lightheadedness 08/08/2017   Essential hypertension 03/05/2016   Lumbar spondylosis 04/02/2015   Left knee pain 04/02/2015   Migraine 05/08/2013   Osteoporosis 11/10/2009   Vitamin D  deficiency 11/04/2009   Hyperlipidemia 11/04/2009   Backache 11/04/2009   FATIGUE 11/04/2009   Asymptomatic postmenopausal status 11/04/2009    PCP: Alvan Craven MD REFERRING PROVIDER: Alvan Craven MD REFERRING DIAG:  Diagnosis  S32.020A (ICD-10-CM) - Compression fracture of L2 vertebra, initial encounter (HCC)   Rationale for Evaluation and Treatment: Rehabilitation  THERAPY DIAG:  Other low back pain  Muscle weakness (generalized)  ONSET DATE: 09/13/23   SUBJECTIVE:                                                                                                                                                                                           SUBJECTIVE STATEMENT: Patient arrived to session with noticeable abrasion on Rt forearm; patient states she knocked her arm in garage door and it sliced the skin off. She is airing it out and does not have bandage covered. Patient reports her low back is bothering her again but the pain is low; states her neck is still tight and painful on the left side when turning to the left.   EVAL: The patient reports that she had a fall on  May 28 and hit her head and bumped her arm. 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 She is having significant pain that is constant in nature. She cannot get down on the floor (she would get down to pray or exercise prior to this pain) and notes difficulty trying to get up.   PERTINENT HISTORY:  Osteoporosis, H/o back pain from early 90s. A-fib, emphysema, migraine, HTN.  PAIN:  Are you having pain? Yes: NPRS scale: neck 5/10 ; back is 2-3/10 Pain location: low back and neck Pain description: soreness, gets to be sharp pain and muscle spasms Aggravating factors: getting up after sitting Relieving factors: heating pad,   PRECAUTIONS: Back *precautions due to acute compression fx and osteoporosis  RED FLAGS: Compression fracture: Yes: fall on May 28 with pain beginning 2 weeks post fall   Bowel and bladder: denies issues Denies saddle paresthesias  WEIGHT BEARING RESTRICTIONS: No  FALLS:  Has patient fallen in last 6 months? Yes. Number of falls 1 time in May; she describes 2-3 over the past year  LIVING ENVIRONMENT: Lives with: lives alone Lives in: House/apartment Stairs: Yes: Internal: 12 to basement, 4-5 going into home;  steps; one rail Has following equipment at home: None  PLOF: Independent  PATIENT GOALS: reduce, walk upright, return to doing exercises  OBJECTIVE:  Note: Objective  measures were completed at Evaluation unless otherwise noted.  DIAGNOSTIC FINDINGS:  IMPRESSION: 1. New mild compression deformity of L2, possibly acute. 2. Stable L1 compression deformity. 3. Moderate multilevel degenerative change.   PATIENT SURVEYS:  Modified Oswestry: 54%  COGNITION: Overall cognitive status: Within functional limits for tasks assessed     SENSATION: WFL  POSTURE: No Significant postural limitations  PALPATION: None today-- due to   LUMBAR ROM: Deferred due to recent compression fx AROM eval  Flexion   Extension   Right lateral flexion   Left lateral flexion   Right rotation   Left rotation    (Blank rows = not tested)  LOWER EXTREMITY ROM:   WFLs Active  Right eval Left eval  Hip flexion    Hip extension    Hip abduction    Hip adduction    Hip internal rotation    Hip external rotation    Knee flexion    Knee extension    Ankle dorsiflexion    Ankle plantarflexion    Ankle inversion    Ankle eversion     (Blank rows = not tested)  LOWER EXTREMITY MMT:  deferred due to pain-- able to do SLR  MMT Right eval Left eval  Hip flexion    Hip extension    Hip abduction    Hip adduction    Hip internal rotation    Hip external rotation    Knee flexion    Knee extension    Ankle dorsiflexion    Ankle plantarflexion    Ankle inversion    Ankle eversion     (Blank rows = not tested)  LUMBAR SPECIAL TESTS:  Deferred due to compression fx  FUNCTIONAL TESTS:  Log roll for bed mobility instructed and performed  GAIT: Distance walked: 20/13.16 seconds=1.52 ft/sec Assistive device utilized: None Level of assistance: Complete Independence Comments: slowed pace Pain after walking when she first sits down   [facility] Adult PT Treatment:  DATE: 10/13/2023 Therapeutic Exercise: S/L open books  Pin & stretch SCM & UT stretches (Lt) Seated:  Cervical rotation & lateral flexion AROM   Standing  heel raises Manual Therapy: STM Lt UT, posterior scalenes Therapeutic Activity: Supine:  Core marching Bridges with ball b/w knees SLR x10 (bil) Standing squats with hip hinge focus + railing for UE support Hip hinge mini squats + seat tap on pillow stack   [facility] Adult PT Treatment:                                                DATE: 10/11/2023 Manual Therapy: STM Lt upper trap & levator, cervical paraspinals, suboccipitals  Neuromuscular re-ed: Seated with towel roll vertical along spine: Rows (no resistance) Cactus arm open book chest opener --> tactile assist for cervical elongation/extension Cervical isometrics --> lateral flexion (bil) & extension 2x5x5 each Therapeutic Activity: Supine with towel roll horizontal at thoracic + shoulder flexion with dowel --> added breathing cues Scapula retraction with chest lift --> seated with towel roll vertical along spine Self Care: Sitting towel roll along spine for postural support   [facility] Adult PT Treatment:                                                DATE: 10/06/23 Therapeutic Exercise: Sidelying Open book x 10 reps R SL-- unable to tolerate L SL due to hip pain Supine Horizontal abduction x 10 reps AROM Neck AROM Seated Hamstring stretch R and L x 30 seconds each  Manual Therapy: STM suboccipitals PROM into flexion with rotation Gentle manual distraction Isometrics contract/relax cervical spine rotation + sidebending x 3 second holds x 5 reps Neuromuscular re-ed: Core activation  Standing reaching overhead with ball squeeze and core engagement Seated on disc with postural cues with breathing in with overhead reach and exhalation with arms down Standing anterior/posterior pelvic tilt   [facility] Adult PT Treatment:                                                DATE: 10/03/23 Therapeutic Exercise: Supine Horizontal abduction bilateral Ues Marching with isometric holds x 5 reps Spinal decompression series chin tucks x 8,  scapular retraction x 8, W x 8 Head nods on coregous ball AROM supine with 2 pillows Manual Therapy: STM suboccipital release STM scalenes, upper trap Isometrics with contract/relax cervical spine Neuromuscular re-ed: Standing core engagement pressing into physioball x 6 reps x 5 seconds holds Posture re-ed working on rocking small ROM posterior/anterior pelvic tilt in sitting position Therapeutic Activity: TA activation  R leg 90/90 to SLR x 8 reps and then repeat on L Marching with TA activation Bent knee fallouts   PATIENT EDUCATION:  Education details: HEP Person educated: Patient Education method: Programmer, multimedia, Facilities manager, and Handouts Education comprehension: verbalized understanding, returned demonstration, and needs further education  HOME EXERCISE PROGRAM: Access Code: [email or web address] URL: [email or web address] Date: 10/06/2023 Prepared by: Tawni Ferrier  Program Notes THESE SHOULD NOT INCREASE PAIN. If so, wait until you return to therapy.  Exercises - Supine Active Straight Leg Raise  - 1 x  daily - 5 x weekly - 1 sets - 10 reps - Heel Raises with Counter Support  - 1 x daily - 5 x weekly - 1 sets - 10 reps - Supine Diaphragmatic Breathing  - 1 x daily - 7 x weekly - 3 sets - 10 reps - Supine March  - 1 x daily - 7 x weekly - 3 sets - 10 reps - Beginner Bridge  - 1 x daily - 7 x weekly - 3 sets - 10 reps - Sit to Stand Without Arm Support  - 2-3 x daily - 7 x weekly - 1 sets - 5 reps - Seated Cervical Flexion AROM  - 1 x daily - 7 x weekly - 1 sets - 10 reps - Supine Cervical Rotation AROM on Pillow  - 1 x daily - 7 x weekly - 1 sets - 10 reps - Seated Isometric Cervical Sidebending  - 2 x daily - 7 x weekly - 1 sets - 10 reps   ASSESSMENT: CLINICAL IMPRESSION: Improved cervical mobility with less pain on left side noted after manual intervention and pin & stretch technique for cervical musculature. Standing exercises incorporated to progress  endurance. Hip hinge mechanics during squats introduced; tactile cues provided to improve spinal alignment and postural awareness. Noted improvement in neck and and back pain at end of session.  EVAL: Patient is an 83  y.o. female who was seen today for physical therapy evaluation and treatment for compression fx and low back pain. Her pain varies from 5/10 up to 10/10 and is typically worse in the morning x first hour of waking, coughing, and after sitting and transitioning to stand. Evaluation was limited due to high irritability and precautions due to fx. She presents with impairments in ROM, strength, pain, and overall functional mobility. PT recommends lumbar brace due to characteristics of pain during evaluation (winces with coughing and sit<>stand)-- will reach out to request order.  GOALS: Goals reviewed with patient? Yes  SHORT TERM GOALS: Target date: 10/14/23   The patient will be indep with initial HEP Baseline: initiated at eval Goal status: MET  2.  The patient will report pain at worst 6/10 Baseline:  10/10 Goal status: MET-- back pain at worst is 5/10  LONG TERM GOALS: Target date: 11/14/23   The patient will be indep with HEP progression. Baseline:  Initiated at eval Goal status: INITIAL  2.  The patient will improve modified oswestry score by 12% Baseline: see above Goal status: INITIAL  3.  The patient will improve gait speed to 2.3 ft/sec Baseline: 1.52 ft/sec Goal status: INITIAL  4.  The patient will improve pain to < or equal to 2/10 at rest. Baseline:  5-6/10 Goal status: INITIAL PLAN:  PT FREQUENCY: 2x/week  PT DURATION: 8 weeks  PLANNED INTERVENTIONS: [id number]- PT Re-evaluation, 97750- Physical Performance Testing, 97110-Therapeutic exercises, 97530- Therapeutic activity, [id number]- Neuromuscular re-education, 97535- Self Care, [id number]- Manual therapy, [id number]- Gait training, [phone]- Aquatic Therapy, Patient/Family education, Balance training, Cryotherapy, and  Moist heat.  PLAN FOR NEXT SESSION: More standing exercises to work on endurance, balance activities, promote lumbar curve awareness, LE strengthening to tolerance, progress to patient tolerance, gait training, return to functional tasks with modifications and education re: osteoporosis. Neck AROM and STM as needed.   Lamarr GORMAN Price, PTA 10/13/2023, 12:35 PM

## 2023-10-17 ENCOUNTER — Encounter: Payer: Self-pay | Admitting: Rehabilitative and Restorative Service Providers"

## 2023-10-17 ENCOUNTER — Ambulatory Visit: Admitting: Rehabilitative and Restorative Service Providers"

## 2023-10-17 DIAGNOSIS — M5459 Other low back pain: Secondary | ICD-10-CM

## 2023-10-17 DIAGNOSIS — M6281 Muscle weakness (generalized): Secondary | ICD-10-CM

## 2023-10-17 NOTE — Therapy (Addendum)
 OUTPATIENT PHYSICAL THERAPY THORACOLUMBAR TREATMENT AND PROGRESS NOTE  Patient Name: Tyna Huertas MRN: [id number] DOB:1940-09-09, 83 y.o., female Today's Date: 10/17/2023  Physical Therapy Progress Note   Dates of Reporting Period:09/14/23-10/17/23   Objective Measurements: BACK PAIN improved from 10/10 to 5/10 at worst  Reason Skilled Services are Required: Patient's low back with significant improvement. She notes instaiblity, dec'd balance, and dec'd endurance that continue to need skilled intervention for return to prior functional status.   Thank you for the referral of this patient.    END OF SESSION:  PT End of Session - 10/17/23 1157     Visit Number 10    Number of Visits 16    Date for PT Re-Evaluation 11/13/23    Authorization Type [employer] medicare    Progress Note Due on Visit 10    PT Start Time 1156    PT Stop Time 1234    PT Time Calculation (min) 38 min    Activity Tolerance Patient tolerated treatment well    Behavior During Therapy WFL for tasks assessed/performed          Past Medical History:  Diagnosis Date   Atrial fibrillation (HCC)    Back pain    Hypertension    Past Surgical History:  Procedure Laterality Date   SKIN CANCER EXCISION  05/2022   on the chest   TONSILLECTOMY     TUBAL LIGATION     bilateral   Patient Active Problem List   Diagnosis Date Noted   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   Atrial fibrillation (HCC) 10/06/2022   Near syncope 09/03/2020   Pulmonary nodule, right 06/26/2019   MDD (major depressive disorder), recurrent episode (HCC) 12/26/2018   Emphysema lung (HCC) 03/28/2018   Episodic lightheadedness 08/08/2017   Essential hypertension 03/05/2016   Lumbar spondylosis 04/02/2015   Left knee pain 04/02/2015   Migraine 05/08/2013   Osteoporosis 11/10/2009   Vitamin D  deficiency 11/04/2009   Hyperlipidemia 11/04/2009   Backache 11/04/2009   FATIGUE 11/04/2009   Asymptomatic postmenopausal status 11/04/2009    PCP:  Alvan Craven MD REFERRING PROVIDER: Alvan Craven MD REFERRING DIAG:  Diagnosis  S32.020A (ICD-10-CM) - Compression fracture of L2 vertebra, initial encounter (HCC)   Rationale for Evaluation and Treatment: Rehabilitation  THERAPY DIAG:  Other low back pain  Muscle weakness (generalized)  ONSET DATE: 09/13/23   SUBJECTIVE:                                                                                                                                                                                          SUBJECTIVE STATEMENT: The patient reports she tried to walk  with her daughter in law. She is noticing that she is only taking shallow breaths and thinks this impacts her walking. She has pain L cervical spine.  The neck pain improves with exercise but then comes back.   EVAL: The patient reports that she had a fall on May 28 and hit her head and bumped her arm. 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 She is having significant pain that is constant in nature. She cannot get down on the floor (she would get down to pray or exercise prior to this pain) and notes difficulty trying to get up.   PERTINENT HISTORY:  Osteoporosis, H/o back pain from early 90s. A-fib, emphysema, migraine, HTN.  PAIN:  Are you having pain? Yes: NPRS scale: neck 5/10 ; back is 2-3/10 Pain location: low back and neck Pain description: soreness, gets to be sharp pain and muscle spasms Aggravating factors: getting up after sitting Relieving factors: heating pad,   PRECAUTIONS: Back *precautions due to acute compression fx and osteoporosis  RED FLAGS: Compression fracture: Yes: fall on May 28 with pain beginning 2 weeks post fall   Bowel and bladder: denies issues Denies saddle paresthesias  WEIGHT BEARING RESTRICTIONS: No  FALLS:  Has patient fallen in last 6 months? Yes. Number of falls 1 time in May; she  describes 2-3 over the past year  LIVING ENVIRONMENT: Lives with: lives alone Lives in: House/apartment Stairs: Yes: Internal: 12 to basement, 4-5 going into home;  steps; one rail Has following equipment at home: None  PLOF: Independent  PATIENT GOALS: reduce, walk upright, return to doing exercises  OBJECTIVE:  Note: Objective measures were completed at Evaluation unless otherwise noted.  DIAGNOSTIC FINDINGS:  IMPRESSION: 1. New mild compression deformity of L2, possibly acute. 2. Stable L1 compression deformity. 3. Moderate multilevel degenerative change.   PATIENT SURVEYS:  Modified Oswestry: 54%  COGNITION: Overall cognitive status: Within functional limits for tasks assessed     SENSATION: WFL  POSTURE: No Significant postural limitations  PALPATION: None today-- due to   LUMBAR ROM: Deferred due to recent compression fx AROM eval  Flexion   Extension   Right lateral flexion   Left lateral flexion   Right rotation   Left rotation    (Blank rows = not tested)  LOWER EXTREMITY ROM:   WFLs Active  Right eval Left eval  Hip flexion    Hip extension    Hip abduction    Hip adduction    Hip internal rotation    Hip external rotation    Knee flexion    Knee extension    Ankle dorsiflexion    Ankle plantarflexion    Ankle inversion    Ankle eversion     (Blank rows = not tested)  LOWER EXTREMITY MMT:  deferred due to pain-- able to do SLR  MMT Right eval Left eval  Hip flexion    Hip extension    Hip abduction    Hip adduction    Hip internal rotation    Hip external rotation    Knee flexion    Knee extension    Ankle dorsiflexion    Ankle plantarflexion    Ankle inversion    Ankle eversion     (Blank rows = not tested)  LUMBAR SPECIAL TESTS:  Deferred due to compression fx  FUNCTIONAL TESTS:  Log roll for bed mobility instructed and  performed  GAIT: Distance walked: 20/13.16 seconds=1.52 ft/sec Assistive device utilized:  None Level of assistance: Complete Independence Comments: slowed pace Pain after walking when she first sits down   [facility] Adult PT Treatment:                                                DATE: 10/17/23 Therapeutic Exercise: Standing Palloff press yellow band x 5 reps R and L (attempted red band but too difficult)-- this fatigues patient in core musculature Patient needs cues for upright positioning and rib/lumbar posture Manual Therapy: STM bilateral cervical spine paraspinals, SCM and scalenes PROM neck flexion and lateral flexion Neuromuscular re-ed: Seated  Anterior and posterior pelvic tilt on physiodisc to encourage pelvic mobility Tactile and demo cues for positioning Standing Breathing in with arms into abduction and out pressing down to return to side x 5 reps with tactile cues at ribs and verbal cues for lumbar positioning Supine Deep breathing cues with arms overhead and lowering on exhalation Therapeutic Activity: Standing step ups to 6 step up/up, down/down x 5 reps R and L sides Standing yoga block between thighs with cues for pelvic positioning and rib position in standing Gait: Gait activities working on postural upright with tactile and demo cues    [facility] Adult PT Treatment:                                                DATE: 10/13/2023 Therapeutic Exercise: S/L open books  Pin & stretch SCM & UT stretches (Lt) Seated:  Cervical rotation & lateral flexion AROM   Standing heel raises Manual Therapy: STM Lt UT, posterior scalenes Therapeutic Activity: Supine:  Core marching Bridges with ball b/w knees SLR x10 (bil) Standing squats with hip hinge focus + railing for UE support Hip hinge mini squats + seat tap on pillow stack   [facility] Adult PT Treatment:                                                DATE: 10/11/2023 Manual Therapy: STM Lt upper trap & levator, cervical paraspinals, suboccipitals  Neuromuscular re-ed: Seated with towel roll vertical along  spine: Rows (no resistance) Cactus arm open book chest opener --> tactile assist for cervical elongation/extension Cervical isometrics --> lateral flexion (bil) & extension 2x5x5 each Therapeutic Activity: Supine with towel roll horizontal at thoracic + shoulder flexion with dowel --> added breathing cues Scapula retraction with chest lift --> seated with towel roll vertical along spine Self Care: Sitting towel roll along spine for postural support   PATIENT EDUCATION:  Education details: HEP Person educated: Patient Education method: Programmer, multimedia, Facilities manager, and Handouts Education comprehension: verbalized understanding, returned demonstration, and needs further education  HOME EXERCISE PROGRAM: Access Code: [email or web address] URL: [email or web address] Date: 10/06/2023 Prepared by: Tawni Ferrier  Program Notes THESE SHOULD NOT INCREASE PAIN. If so, wait until you return to therapy.  Exercises - Supine Active Straight Leg Raise  - 1 x daily - 5 x weekly - 1 sets - 10 reps - Heel Raises with Counter Support  - 1 x daily -  5 x weekly - 1 sets - 10 reps - Supine Diaphragmatic Breathing  - 1 x daily - 7 x weekly - 3 sets - 10 reps - Supine March  - 1 x daily - 7 x weekly - 3 sets - 10 reps - Beginner Bridge  - 1 x daily - 7 x weekly - 3 sets - 10 reps - Sit to Stand Without Arm Support  - 2-3 x daily - 7 x weekly - 1 sets - 5 reps - Seated Cervical Flexion AROM  - 1 x daily - 7 x weekly - 1 sets - 10 reps - Supine Cervical Rotation AROM on Pillow  - 1 x daily - 7 x weekly - 1 sets - 10 reps - Seated Isometric Cervical Sidebending  - 2 x daily - 7 x weekly - 1 sets - 10 reps   ASSESSMENT: CLINICAL IMPRESSION: Neck pain reduces to 2-3/10 at the end of therapy, and low back is also a 2-3/10. She feels fatigue more than pain. PT progressed to some standing balance and standing strengthening activities to begin to focus on functional activities for the home. Plan to  continue to work towards LTGs.   EVAL: Patient is an 83  y.o. female who was seen today for physical therapy evaluation and treatment for compression fx and low back pain. Her pain varies from 5/10 up to 10/10 and is typically worse in the morning x first hour of waking, coughing, and after sitting and transitioning to stand. Evaluation was limited due to high irritability and precautions due to fx. She presents with impairments in ROM, strength, pain, and overall functional mobility. PT recommends lumbar brace due to characteristics of pain during evaluation (winces with coughing and sit<>stand)-- will reach out to request order.  GOALS: Goals reviewed with patient? Yes  SHORT TERM GOALS: Target date: 10/14/23   The patient will be indep with initial HEP Baseline: initiated at eval Goal status: MET  2.  The patient will report pain at worst 6/10 Baseline:  10/10 Goal status: MET-- back pain at worst is 5/10  LONG TERM GOALS: Target date: 11/14/23   The patient will be indep with HEP progression. Baseline:  Initiated at eval Goal status: INITIAL  2.  The patient will improve modified oswestry score by 12% Baseline: see above Goal status: INITIAL  3.  The patient will improve gait speed to 2.3 ft/sec Baseline: 1.52 ft/sec Goal status: INITIAL  4.  The patient will improve pain to < or equal to 2/10 at rest. Baseline:  5-6/10 Goal status: INITIAL PLAN:  PT FREQUENCY: 2x/week  PT DURATION: 8 weeks  PLANNED INTERVENTIONS: [id number]- PT Re-evaluation, [id number]- Physical Performance Testing, 97110-Therapeutic exercises, 97530- Therapeutic activity, [id number]- Neuromuscular re-education, 97535- Self Care, [id number]- Manual therapy, [id number]- Gait training, [id number]- Aquatic Therapy, Patient/Family education, Balance training, Cryotherapy, and Moist heat.  PLAN FOR NEXT SESSION: More standing exercises to work on endurance, balance activities, promote lumbar curve awareness, LE strengthening to tolerance,  progress to patient tolerance, gait training, return to functional tasks with modifications and education re: osteoporosis. Neck AROM and STM as needed.    Devarion Mcclanahan, PT 10/17/2023, 11:58 AM

## 2023-10-19 ENCOUNTER — Other Ambulatory Visit: Payer: Self-pay | Admitting: Family Medicine

## 2023-10-19 DIAGNOSIS — F419 Anxiety disorder, unspecified: Secondary | ICD-10-CM

## 2023-10-20 ENCOUNTER — Encounter: Payer: Self-pay | Admitting: Rehabilitative and Restorative Service Providers"

## 2023-10-20 ENCOUNTER — Ambulatory Visit: Admitting: Rehabilitative and Restorative Service Providers"

## 2023-10-20 DIAGNOSIS — M5459 Other low back pain: Secondary | ICD-10-CM | POA: Diagnosis not present

## 2023-10-20 DIAGNOSIS — M6281 Muscle weakness (generalized): Secondary | ICD-10-CM

## 2023-10-20 NOTE — Therapy (Signed)
 OUTPATIENT PHYSICAL THERAPY THORACOLUMBAR TREATMENT  Patient Name: Bilan Tedesco MRN: [id number] DOB:19-Apr-1940, 83 y.o., female Today's Date: 10/20/2023  END OF SESSION:  PT End of Session - 10/20/23 1147     Visit Number 11    Number of Visits 16    Date for PT Re-Evaluation 11/13/23    Authorization Type [employer] medicare    Progress Note Due on Visit 10    PT Start Time 1147    PT Stop Time 1230    PT Time Calculation (min) 43 min    Activity Tolerance Patient tolerated treatment well    Behavior During Therapy WFL for tasks assessed/performed          Past Medical History:  Diagnosis Date   Atrial fibrillation (HCC)    Back pain    Hypertension    Past Surgical History:  Procedure Laterality Date   SKIN CANCER EXCISION  05/2022   on the chest   TONSILLECTOMY     TUBAL LIGATION     bilateral   Patient Active Problem List   Diagnosis Date Noted   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   Atrial fibrillation (HCC) 10/06/2022   Near syncope 09/03/2020   Pulmonary nodule, right 06/26/2019   MDD (major depressive disorder), recurrent episode (HCC) 12/26/2018   Emphysema lung (HCC) 03/28/2018   Episodic lightheadedness 08/08/2017   Essential hypertension 03/05/2016   Lumbar spondylosis 04/02/2015   Left knee pain 04/02/2015   Migraine 05/08/2013   Osteoporosis 11/10/2009   Vitamin D  deficiency 11/04/2009   Hyperlipidemia 11/04/2009   Backache 11/04/2009   FATIGUE 11/04/2009   Asymptomatic postmenopausal status 11/04/2009    PCP: Alvan Craven MD REFERRING PROVIDER: Alvan Craven MD REFERRING DIAG:  Diagnosis  S32.020A (ICD-10-CM) - Compression fracture of L2 vertebra, initial encounter (HCC)   Rationale for Evaluation and Treatment: Rehabilitation  THERAPY DIAG:  Other low back pain  Muscle weakness (generalized)  ONSET DATE: 09/13/23   SUBJECTIVE:                                                                                                                                                                                           SUBJECTIVE STATEMENT: The patient reports she had increased neck pain after her last therapy session, but woke up the next day feeling better. She felt tired from standing walking activities.  EVAL: The patient reports that she had a fall on May 28 and hit her head and bumped her arm. 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 She is having significant pain  that is constant in nature. She cannot get down on the floor (she would get down to pray or exercise prior to this pain) and notes difficulty trying to get up.   PERTINENT HISTORY:  Osteoporosis, H/o back pain from early 90s. A-fib, emphysema, migraine, HTN.  PAIN:  Are you having pain? Yes: NPRS scale: neck 3/10 ; back is 2/10 Pain location: low back and neck Pain description: soreness, gets to be sharp pain and muscle spasms Aggravating factors: getting up after sitting Relieving factors: heating pad,   PRECAUTIONS: Back *precautions due to acute compression fx and osteoporosis  RED FLAGS: Compression fracture: Yes: fall on May 28 with pain beginning 2 weeks post fall   Bowel and bladder: denies issues Denies saddle paresthesias  WEIGHT BEARING RESTRICTIONS: No  FALLS:  Has patient fallen in last 6 months? Yes. Number of falls 1 time in May; she describes 2-3 over the past year  LIVING ENVIRONMENT: Lives with: lives alone Lives in: House/apartment Stairs: Yes: Internal: 12 to basement, 4-5 going into home;  steps; one rail Has following equipment at home: None  PLOF: Independent  PATIENT GOALS: reduce, walk upright, return to doing exercises  OBJECTIVE:  Note: Objective measures were completed at Evaluation unless otherwise noted.  DIAGNOSTIC FINDINGS:  IMPRESSION: 1. New mild compression deformity of L2, possibly acute. 2. Stable L1 compression  deformity. 3. Moderate multilevel degenerative change.   PATIENT SURVEYS:  Modified Oswestry: 54%  COGNITION: Overall cognitive status: Within functional limits for tasks assessed     SENSATION: WFL  POSTURE: No Significant postural limitations  PALPATION: None today-- due to   LUMBAR ROM: Deferred due to recent compression fx AROM eval  Flexion   Extension   Right lateral flexion   Left lateral flexion   Right rotation   Left rotation    (Blank rows = not tested)  LOWER EXTREMITY ROM:   WFLs Active  Right eval Left eval  Hip flexion    Hip extension    Hip abduction    Hip adduction    Hip internal rotation    Hip external rotation    Knee flexion    Knee extension    Ankle dorsiflexion    Ankle plantarflexion    Ankle inversion    Ankle eversion     (Blank rows = not tested)  LOWER EXTREMITY MMT:  deferred due to pain-- able to do SLR  MMT Right eval Left eval  Hip flexion    Hip extension    Hip abduction    Hip adduction    Hip internal rotation    Hip external rotation    Knee flexion    Knee extension    Ankle dorsiflexion    Ankle plantarflexion    Ankle inversion    Ankle eversion     (Blank rows = not tested)  LUMBAR SPECIAL TESTS:  Deferred due to compression fx  FUNCTIONAL TESTS:  Log roll for bed mobility instructed and performed  GAIT: Distance walked: 20/13.16 seconds=1.52 ft/sec Assistive device utilized: None Level of assistance: Complete Independence Comments: slowed pace Pain after walking when she first sits down  [facility] Adult PT Treatment:                                                DATE: 10/20/23 Therapeutic Exercise: Quadriped Rocking anterior/posterior Initiating minimal  pelvic tilts with some discomfort so discontinued Sidelying Hip abduction x 10 reps R and L sides Standing Yellow palloff press x 5 reps each direction Supine Beginner bridge x 10 reps Physioball rolllouts bilateral knees to chest x 10  reps Neuromuscular re-ed: Seated  Anterior and posterior pelvic tilt on physiodisc to encourage pelvic mobility Tactile and demo cues for positioning  Standing Breathing in with arms into abduction and out pressing down to return to side x 5 reps with tactile cues at ribs and verbal cues for lumbar positioning Compliant surface standing with eyes open and intermittent UE support with CGA Added alternating arm reaches on compliant surface with CGA and intermittent UE support Eyes closed on compliant surfaces with CGA Stepping posteriorly off of compliant surfaces with UE support Therapeutic Activity: Standing step ups to 4 step x 5 reps R and L sides Standing yoga block between thighs with cues for pelvic positioning and rib position in standing Standing ball press for isometric core activation x 10 reps x 3 second holds   [facility] Adult PT Treatment:                                                DATE: 10/17/23 Therapeutic Exercise: Standing Palloff press yellow band x 5 reps R and L (attempted red band but too difficult)-- this fatigues patient in core musculature Patient needs cues for upright positioning and rib/lumbar posture Manual Therapy: STM bilateral cervical spine paraspinals, SCM and scalenes PROM neck flexion and lateral flexion Neuromuscular re-ed: Seated  Anterior and posterior pelvic tilt on physiodisc to encourage pelvic mobility Tactile and demo cues for positioning Standing Breathing in with arms into abduction and out pressing down to return to side x 5 reps with tactile cues at ribs and verbal cues for lumbar positioning Supine Deep breathing cues with arms overhead and lowering on exhalation Therapeutic Activity: Standing step ups to 6 step up/up, down/down x 5 reps R and L sides Standing yoga block between thighs with cues for pelvic positioning and rib position in standing Gait: Gait activities working on postural upright with tactile and demo cues     [facility] Adult PT Treatment:                                                DATE: 10/13/2023 Therapeutic Exercise: S/L open books  Pin & stretch SCM & UT stretches (Lt) Seated:  Cervical rotation & lateral flexion AROM   Standing heel raises Manual Therapy: STM Lt UT, posterior scalenes Therapeutic Activity: Supine:  Core marching Bridges with ball b/w knees SLR x10 (bil) Standing squats with hip hinge focus + railing for UE support Hip hinge mini squats + seat tap on pillow stack   [facility] Adult PT Treatment:                                                DATE: 10/11/2023 Manual Therapy: STM Lt upper trap & levator, cervical paraspinals, suboccipitals  Neuromuscular re-ed: Seated with towel roll vertical along spine: Rows (no resistance) Cactus arm open book chest opener -->  tactile assist for cervical elongation/extension Cervical isometrics --> lateral flexion (bil) & extension 2x5x5 each Therapeutic Activity: Supine with towel roll horizontal at thoracic + shoulder flexion with dowel --> added breathing cues Scapula retraction with chest lift --> seated with towel roll vertical along spine Self Care: Sitting towel roll along spine for postural support   PATIENT EDUCATION:  Education details: HEP Person educated: Patient Education method: Programmer, multimedia, Facilities manager, and Handouts Education comprehension: verbalized understanding, returned demonstration, and needs further education  HOME EXERCISE PROGRAM: Access Code: [email or web address] URL: [email or web address] Date: 10/06/2023 Prepared by: Tawni Ferrier  Exercises - Supine Active Straight Leg Raise  - 1 x daily - 5 x weekly - 1 sets - 10 reps - Heel Raises with Counter Support  - 1 x daily - 5 x weekly - 1 sets - 10 reps - Supine Diaphragmatic Breathing  - 1 x daily - 7 x weekly - 3 sets - 10 reps - Supine March  - 1 x daily - 7 x weekly - 3 sets - 10 reps - Beginner Bridge  - 1 x daily - 7 x weekly - 3 sets  - 10 reps - Sit to Stand Without Arm Support  - 2-3 x daily - 7 x weekly - 1 sets - 5 reps - Seated Cervical Flexion AROM  - 1 x daily - 7 x weekly - 1 sets - 10 reps - Supine Cervical Rotation AROM on Pillow  - 1 x daily - 7 x weekly - 1 sets - 10 reps - Seated Isometric Cervical Sidebending  - 2 x daily - 7 x weekly - 1 sets - 10 reps   ASSESSMENT: CLINICAL IMPRESSION: The patient arrived today with less pain and reported improvement this week. PT progressed to further standing and note improved postural awareness in standing (less thoracic kyphosis with fatigue). She continues to need cues with repetition. The patient and PT also initiated more balance specific training due to reports of functional difficulties with turns, walking with scaning, etc.  EVAL: Patient is an 83  y.o. female who was seen today for physical therapy evaluation and treatment for compression fx and low back pain. Her pain varies from 5/10 up to 10/10 and is typically worse in the morning x first hour of waking, coughing, and after sitting and transitioning to stand. Evaluation was limited due to high irritability and precautions due to fx. She presents with impairments in ROM, strength, pain, and overall functional mobility. PT recommends lumbar brace due to characteristics of pain during evaluation (winces with coughing and sit<>stand)-- will reach out to request order.  GOALS: Goals reviewed with patient? Yes  SHORT TERM GOALS: Target date: 10/14/23   The patient will be indep with initial HEP Baseline: initiated at eval Goal status: MET  2.  The patient will report pain at worst 6/10 Baseline:  10/10 Goal status: MET-- back pain at worst is 5/10  LONG TERM GOALS: Target date: 11/14/23   The patient will be indep with HEP progression. Baseline:  Initiated at eval Goal status: INITIAL  2.  The patient will improve modified oswestry score by 12% Baseline: see above Goal status: INITIAL  3.  The patient  will improve gait speed to 2.3 ft/sec Baseline: 1.52 ft/sec Goal status: INITIAL  4.  The patient will improve pain to < or equal to 2/10 at rest. Baseline:  5-6/10 Goal status: INITIAL PLAN:  PT FREQUENCY: 2x/week  PT DURATION: 8 weeks  PLANNED INTERVENTIONS: 02835- PT Re-evaluation, 97750-  Physical Performance Testing, 97110-Therapeutic exercises, 97530- Therapeutic activity, [id number]- Neuromuscular re-education, 97535- Self Care, [id number]- Manual therapy, [phone]- Gait training, [phone]- Aquatic Therapy, Patient/Family education, Balance training, Cryotherapy, and Moist heat.  PLAN FOR NEXT SESSION: More standing exercises to work on endurance, balance activities, promote lumbar curve awareness, LE strengthening to tolerance, progress to patient tolerance, gait training, return to functional tasks with modifications and education re: osteoporosis. Neck AROM and STM as needed.  Trunk lengthening and core engagement + breath support for functional tasks.   Reda Gettis, PT 10/20/2023, 11:47 AM

## 2023-10-24 ENCOUNTER — Encounter

## 2023-10-25 ENCOUNTER — Ambulatory Visit

## 2023-10-25 DIAGNOSIS — M5459 Other low back pain: Secondary | ICD-10-CM

## 2023-10-25 DIAGNOSIS — M6281 Muscle weakness (generalized): Secondary | ICD-10-CM

## 2023-10-25 NOTE — Therapy (Signed)
 OUTPATIENT PHYSICAL THERAPY THORACOLUMBAR TREATMENT  Patient Name: Christy Gutierrez MRN: [id number] DOB:November 29, 1940, 83 y.o., female Today's Date: 10/25/2023  END OF SESSION:  PT End of Session - 10/25/23 1103     Visit Number 12    Number of Visits 16    Date for PT Re-Evaluation 11/13/23    Authorization Type [employer] medicare    PT Start Time 1105    PT Stop Time 1145    PT Time Calculation (min) 40 min    Activity Tolerance Patient tolerated treatment well    Behavior During Therapy WFL for tasks assessed/performed         Past Medical History:  Diagnosis Date   Atrial fibrillation (HCC)    Back pain    Hypertension    Past Surgical History:  Procedure Laterality Date   SKIN CANCER EXCISION  05/2022   on the chest   TONSILLECTOMY     TUBAL LIGATION     bilateral   Patient Active Problem List   Diagnosis Date Noted   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   Atrial fibrillation (HCC) 10/06/2022   Near syncope 09/03/2020   Pulmonary nodule, right 06/26/2019   MDD (major depressive disorder), recurrent episode (HCC) 12/26/2018   Emphysema lung (HCC) 03/28/2018   Episodic lightheadedness 08/08/2017   Essential hypertension 03/05/2016   Lumbar spondylosis 04/02/2015   Left knee pain 04/02/2015   Migraine 05/08/2013   Osteoporosis 11/10/2009   Vitamin D  deficiency 11/04/2009   Hyperlipidemia 11/04/2009   Backache 11/04/2009   FATIGUE 11/04/2009   Asymptomatic postmenopausal status 11/04/2009    PCP: Alvan Craven MD REFERRING PROVIDER: Alvan Craven MD REFERRING DIAG:  Diagnosis  S32.020A (ICD-10-CM) - Compression fracture of L2 vertebra, initial encounter (HCC)   Rationale for Evaluation and Treatment: Rehabilitation  THERAPY DIAG:  Other low back pain  Muscle weakness (generalized)  ONSET DATE: 09/13/23   SUBJECTIVE:                                                                                                                                                                                           SUBJECTIVE STATEMENT: Patient reports she had to go to the dentist to have part of a tooth removed and did not feel up to doing exercises afterwards; states she had no pain or soreness after dentist visit. Patient states her neck felt tight this morning when she woke up but did exercises that loosened it up.   EVAL: The patient reports that she had a fall on May 28 and hit her head and bumped her arm. 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 She is having significant pain that is constant in nature. She cannot get down on the floor (she would get down to pray or exercise prior to this pain) and notes difficulty trying to get up.   PERTINENT HISTORY:  Osteoporosis, H/o back pain from early 90s. A-fib, emphysema, migraine, HTN.  PAIN:  Are you having pain? Yes: NPRS scale: neck 3/10 ; back is 2/10 Pain location: low back and neck Pain description: soreness, gets to be sharp pain and muscle spasms Aggravating factors: getting up after sitting Relieving factors: heating pad,   PRECAUTIONS: Back *precautions due to acute compression fx and osteoporosis  RED FLAGS: Compression fracture: Yes: fall on May 28 with pain beginning 2 weeks post fall   Bowel and bladder: denies issues Denies saddle paresthesias  WEIGHT BEARING RESTRICTIONS: No  FALLS:  Has patient fallen in last 6 months? Yes. Number of falls 1 time in May; she describes 2-3 over the past year  LIVING ENVIRONMENT: Lives with: lives alone Lives in: House/apartment Stairs: Yes: Internal: 12 to basement, 4-5 going into home;  steps; one rail Has following equipment at home: None  PLOF: Independent  PATIENT GOALS: reduce, walk upright, return to doing exercises  OBJECTIVE:  Note: Objective measures were completed at Evaluation unless otherwise noted.  DIAGNOSTIC FINDINGS:  IMPRESSION: 1. New mild  compression deformity of L2, possibly acute. 2. Stable L1 compression deformity. 3. Moderate multilevel degenerative change.   PATIENT SURVEYS:  Modified Oswestry: 54%  COGNITION: Overall cognitive status: Within functional limits for tasks assessed     SENSATION: WFL  POSTURE: No Significant postural limitations  PALPATION: None today-- due to   LUMBAR ROM: Deferred due to recent compression fx AROM eval  Flexion   Extension   Right lateral flexion   Left lateral flexion   Right rotation   Left rotation    (Blank rows = not tested)  LOWER EXTREMITY ROM:   WFLs Active  Right eval Left eval  Hip flexion    Hip extension    Hip abduction    Hip adduction    Hip internal rotation    Hip external rotation    Knee flexion    Knee extension    Ankle dorsiflexion    Ankle plantarflexion    Ankle inversion    Ankle eversion     (Blank rows = not tested)  LOWER EXTREMITY MMT:  deferred due to pain-- able to do SLR  MMT Right eval Left eval  Hip flexion    Hip extension    Hip abduction    Hip adduction    Hip internal rotation    Hip external rotation    Knee flexion    Knee extension    Ankle dorsiflexion    Ankle plantarflexion    Ankle inversion    Ankle eversion     (Blank rows = not tested)  LUMBAR SPECIAL TESTS:  Deferred due to compression fx  FUNCTIONAL TESTS:  Log roll for bed mobility instructed and performed  GAIT: Distance walked: 20/13.16 seconds=1.52 ft/sec Assistive device utilized: None Level of assistance: Complete Independence Comments: slowed pace Pain after walking when she first sits down   [facility] Adult PT Treatment:  DATE: 10/25/2023 Neuromuscular re-ed: Unilateral 90/90 heel taps Modified dead bug (leg extension to table) Seated cervical AROM in all directions to tolerance  Therapeutic Activity: Seated breathing + lateral arm reach up/down Supine diaphragmatic breathing +  crossed arm raises for ribcage elevation Seated cat/cow (small range) with hands behind head --> ribcage cues Pec/thoracic extension  in doorway --> arms at 60 degrees --> arms extended low Gait: Gait training + reciprocal arm swing --> added 1#DB to promote momentum with arms    [facility] Adult PT Treatment:                                                DATE: 10/20/23 Therapeutic Exercise: Quadriped Rocking anterior/posterior Initiating minimal pelvic tilts with some discomfort so discontinued Sidelying Hip abduction x 10 reps R and L sides Standing Yellow palloff press x 5 reps each direction Supine Beginner bridge x 10 reps Physioball rolllouts bilateral knees to chest x 10 reps Neuromuscular re-ed: Seated  Anterior and posterior pelvic tilt on physiodisc to encourage pelvic mobility Tactile and demo cues for positioning  Standing Breathing in with arms into abduction and out pressing down to return to side x 5 reps with tactile cues at ribs and verbal cues for lumbar positioning Compliant surface standing with eyes open and intermittent UE support with CGA Added alternating arm reaches on compliant surface with CGA and intermittent UE support Eyes closed on compliant surfaces with CGA Stepping posteriorly off of compliant surfaces with UE support Therapeutic Activity: Standing step ups to 4 step x 5 reps R and L sides Standing yoga block between thighs with cues for pelvic positioning and rib position in standing Standing ball press for isometric core activation x 10 reps x 3 second holds   [facility] Adult PT Treatment:                                                DATE: 10/17/23 Therapeutic Exercise: Standing Palloff press yellow band x 5 reps R and L (attempted red band but too difficult)-- this fatigues patient in core musculature Patient needs cues for upright positioning and rib/lumbar posture Manual Therapy: STM bilateral cervical spine paraspinals, SCM and scalenes PROM  neck flexion and lateral flexion Neuromuscular re-ed: Seated  Anterior and posterior pelvic tilt on physiodisc to encourage pelvic mobility Tactile and demo cues for positioning Standing Breathing in with arms into abduction and out pressing down to return to side x 5 reps with tactile cues at ribs and verbal cues for lumbar positioning Supine Deep breathing cues with arms overhead and lowering on exhalation Therapeutic Activity: Standing step ups to 6 step up/up, down/down x 5 reps R and L sides Standing yoga block between thighs with cues for pelvic positioning and rib position in standing Gait: Gait activities working on postural upright with tactile and demo cues      PATIENT EDUCATION:  Education details: Updated HEP Person educated: Patient Education method: Programmer, multimedia, Facilities manager, and Handouts Education comprehension: verbalized understanding, returned demonstration, and needs further education  HOME EXERCISE PROGRAM: Access Code: [email or web address] URL: [email or web address] Date: 10/25/2023 Prepared by: Lamarr Price  Program Notes THESE SHOULD NOT INCREASE PAIN. If so, wait until you return to therapy.  Exercises -  Supine Active Straight Leg Raise  - 1 x daily - 5 x weekly - 1 sets - 10 reps - Heel Raises with Counter Support  - 1 x daily - 5 x weekly - 1 sets - 10 reps - Supine Diaphragmatic Breathing  - 1 x daily - 7 x weekly - 3 sets - 10 reps - Supine March  - 1 x daily - 7 x weekly - 3 sets - 10 reps - Beginner Bridge  - 1 x daily - 7 x weekly - 3 sets - 10 reps - Sit to Stand Without Arm Support  - 2-3 x daily - 7 x weekly - 1 sets - 5 reps - Seated Cervical Flexion AROM  - 1 x daily - 7 x weekly - 1 sets - 10 reps - Supine Cervical Rotation AROM on Pillow  - 1 x daily - 7 x weekly - 1 sets - 10 reps - Seated Isometric Cervical Sidebending  - 2 x daily - 7 x weekly - 1 sets - 10 reps - Supine Dead Bug with Leg Extension  - 1 x daily - 7 x weekly -  3 sets - 10 reps   ASSESSMENT: CLINICAL IMPRESSION: Breathing activities continued promoting ribcage mobility and postural relaxation. Gentle thoracic extension with cervical elongation incorporated in doorway stretch; arms modified to low setting to alleviate shoulder/neck tension. Gait training focused on progressing reciprocal arm swing and promoting decreased postural tension in trunk.  EVAL: Patient is an 83  y.o. female who was seen today for physical therapy evaluation and treatment for compression fx and low back pain. Her pain varies from 5/10 up to 10/10 and is typically worse in the morning x first hour of waking, coughing, and after sitting and transitioning to stand. Evaluation was limited due to high irritability and precautions due to fx. She presents with impairments in ROM, strength, pain, and overall functional mobility. PT recommends lumbar brace due to characteristics of pain during evaluation (winces with coughing and sit<>stand)-- will reach out to request order.  GOALS: Goals reviewed with patient? Yes  SHORT TERM GOALS: Target date: 10/14/23   The patient will be indep with initial HEP Baseline: initiated at eval Goal status: MET  2.  The patient will report pain at worst 6/10 Baseline:  10/10 Goal status: MET-- back pain at worst is 5/10  LONG TERM GOALS: Target date: 11/14/23   The patient will be indep with HEP progression. Baseline:  Initiated at eval Goal status: INITIAL  2.  The patient will improve modified oswestry score by 12% Baseline: see above Goal status: INITIAL  3.  The patient will improve gait speed to 2.3 ft/sec Baseline: 1.52 ft/sec Goal status: INITIAL  4.  The patient will improve pain to < or equal to 2/10 at rest. Baseline:  5-6/10 Goal status: INITIAL PLAN:  PT FREQUENCY: 2x/week  PT DURATION: 8 weeks  PLANNED INTERVENTIONS: [id number]- PT Re-evaluation, 97750- Physical Performance Testing, 97110-Therapeutic exercises, 97530-  Therapeutic activity, [id number]- Neuromuscular re-education, 97535- Self Care, [id number]- Manual therapy, [id number]- Gait training, [phone]- Aquatic Therapy, Patient/Family education, Balance training, Cryotherapy, and Moist heat.  PLAN FOR NEXT SESSION: More standing exercises to work on endurance, balance activities, promote lumbar curve awareness, LE strengthening to tolerance, progress to patient tolerance, gait training, return to functional tasks with modifications and education re: osteoporosis. Neck AROM and STM as needed.  Trunk lengthening and core engagement + breath support for functional tasks.   Lamarr GORMAN Price, PTA 10/25/2023,  12:00 PM

## 2023-10-26 NOTE — Therapy (Unsigned)
 OUTPATIENT PHYSICAL THERAPY THORACOLUMBAR TREATMENT  Patient Name: Christy Gutierrez MRN: [id number] DOB:Aug 02, 1940, 83 y.o., female Today's Date: 10/27/2023  END OF SESSION:  PT End of Session - 10/27/23 1149     Visit Number 13    Number of Visits 16    Date for PT Re-Evaluation 11/13/23    Authorization Type [employer] medicare    PT Start Time 1150    PT Stop Time 1230    PT Time Calculation (min) 40 min    Activity Tolerance Patient tolerated treatment well    Behavior During Therapy WFL for tasks assessed/performed          Past Medical History:  Diagnosis Date   Atrial fibrillation (HCC)    Back pain    Hypertension    Past Surgical History:  Procedure Laterality Date   SKIN CANCER EXCISION  05/2022   on the chest   TONSILLECTOMY     TUBAL LIGATION     bilateral   Patient Active Problem List   Diagnosis Date Noted   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   Atrial fibrillation (HCC) 10/06/2022   Near syncope 09/03/2020   Pulmonary nodule, right 06/26/2019   MDD (major depressive disorder), recurrent episode (HCC) 12/26/2018   Emphysema lung (HCC) 03/28/2018   Episodic lightheadedness 08/08/2017   Essential hypertension 03/05/2016   Lumbar spondylosis 04/02/2015   Left knee pain 04/02/2015   Migraine 05/08/2013   Osteoporosis 11/10/2009   Vitamin D  deficiency 11/04/2009   Hyperlipidemia 11/04/2009   Backache 11/04/2009   FATIGUE 11/04/2009   Asymptomatic postmenopausal status 11/04/2009    PCP: Alvan Craven MD REFERRING PROVIDER: Alvan Craven MD REFERRING DIAG:  Diagnosis  S32.020A (ICD-10-CM) - Compression fracture of L2 vertebra, initial encounter (HCC)   Rationale for Evaluation and Treatment: Rehabilitation  THERAPY DIAG:  Other low back pain  Muscle weakness (generalized)  ONSET DATE: 09/13/23   SUBJECTIVE:                                                                                                                                                                                           SUBJECTIVE STATEMENT: The patient reports continued neck pain-- mostly in the base of her neck. She has been trying to get her HEP done this week.   EVAL: The patient reports that she had a fall on May 28 and hit her head and bumped her arm. 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 She is having significant pain that is constant in nature. She cannot get down on the floor (  she would get down to pray or exercise prior to this pain) and notes difficulty trying to get up.   PERTINENT HISTORY:  Osteoporosis, H/o back pain from early 90s. A-fib, emphysema, migraine, HTN.  PAIN:  Are you having pain? Yes: NPRS scale: neck 3/10 ; back is 2/10 Pain location: low back and neck Pain description: soreness, gets to be sharp pain and muscle spasms Aggravating factors: getting up after sitting Relieving factors: heating pad,   PRECAUTIONS: Back *precautions due to acute compression fx and osteoporosis  RED FLAGS: Compression fracture: Yes: fall on May 28 with pain beginning 2 weeks post fall   Bowel and bladder: denies issues Denies saddle paresthesias  WEIGHT BEARING RESTRICTIONS: No  FALLS:  Has patient fallen in last 6 months? Yes. Number of falls 1 time in May; she describes 2-3 over the past year  LIVING ENVIRONMENT: Lives with: lives alone Lives in: House/apartment Stairs: Yes: Internal: 12 to basement, 4-5 going into home;  steps; one rail Has following equipment at home: None  PLOF: Independent  PATIENT GOALS: reduce, walk upright, return to doing exercises  OBJECTIVE:  Note: Objective measures were completed at Evaluation unless otherwise noted.  DIAGNOSTIC FINDINGS:  IMPRESSION: 1. New mild compression deformity of L2, possibly acute. 2. Stable L1 compression deformity. 3. Moderate multilevel degenerative change.   PATIENT SURVEYS:  Modified  Oswestry: 54%  COGNITION: Overall cognitive status: Within functional limits for tasks assessed     SENSATION: WFL  POSTURE: No Significant postural limitations  PALPATION: None today-- due to   LUMBAR ROM: Deferred due to recent compression fx AROM eval  Flexion   Extension   Right lateral flexion   Left lateral flexion   Right rotation   Left rotation    (Blank rows = not tested)  LOWER EXTREMITY ROM:   WFLs Active  Right eval Left eval  Hip flexion    Hip extension    Hip abduction    Hip adduction    Hip internal rotation    Hip external rotation    Knee flexion    Knee extension    Ankle dorsiflexion    Ankle plantarflexion    Ankle inversion    Ankle eversion     (Blank rows = not tested)  LOWER EXTREMITY MMT:  deferred due to pain-- able to do SLR  MMT Right eval Left eval  Hip flexion    Hip extension    Hip abduction    Hip adduction    Hip internal rotation    Hip external rotation    Knee flexion    Knee extension    Ankle dorsiflexion    Ankle plantarflexion    Ankle inversion    Ankle eversion     (Blank rows = not tested)  LUMBAR SPECIAL TESTS:  Deferred due to compression fx  FUNCTIONAL TESTS:  Log roll for bed mobility instructed and performed  GAIT: Distance walked: 20/13.16 seconds=1.52 ft/sec Assistive device utilized: None Level of assistance: Complete Independence Comments: slowed pace Pain after walking when she first sits down   [facility] Adult PT Treatment:                                                DATE: 10/26/23 Therapeutic Exercise: Nu-step x 4.5 minutes level 2 Ues/LEs 5.5 minutes of walking non-stop  with cues for  breathing and longer stride length Supine Deep breathing  with washcloth under L thoracic spine Head nods Standing Rows x 10 reps red band Shoulder extension red band x 5 reps R and L  Seated Shoulder rolls Neck AROM rotation Neck AROM flexion/extension Manual Therapy: Manual neck PROM with  rotation and sidebending STM cervical paraspinals Neuromuscular re-ed: Standing core engagement  Palloff press x 10 reps red band with cues for core engagement Postural cues for upright using a wall for support Anatomical scapular press UE diagonals with yellow band against wall for postural cues and neuromuscular re-education   [facility] Adult PT Treatment:                                                DATE: 10/25/2023 Neuromuscular re-ed: Unilateral 90/90 heel taps Modified dead bug (leg extension to table) Seated cervical AROM in all directions to tolerance  Therapeutic Activity: Seated breathing + lateral arm reach up/down Supine diaphragmatic breathing + crossed arm raises for ribcage elevation Seated cat/cow (small range) with hands behind head --> ribcage cues Pec/thoracic extension  in doorway --> arms at 60 degrees --> arms extended low Gait: Gait training + reciprocal arm swing --> added 1#DB to promote momentum with arms   [facility] Adult PT Treatment:                                                DATE: 10/20/23 Therapeutic Exercise: Quadriped Rocking anterior/posterior Initiating minimal pelvic tilts with some discomfort so discontinued Sidelying Hip abduction x 10 reps R and L sides Standing Yellow palloff press x 5 reps each direction Supine Beginner bridge x 10 reps Physioball rolllouts bilateral knees to chest x 10 reps Neuromuscular re-ed: Seated  Anterior and posterior pelvic tilt on physiodisc to encourage pelvic mobility Tactile and demo cues for positioning  Standing Breathing in with arms into abduction and out pressing down to return to side x 5 reps with tactile cues at ribs and verbal cues for lumbar positioning Compliant surface standing with eyes open and intermittent UE support with CGA Added alternating arm reaches on compliant surface with CGA and intermittent UE support Eyes closed on compliant surfaces with CGA Stepping posteriorly off of compliant  surfaces with UE support Therapeutic Activity: Standing step ups to 4 step x 5 reps R and L sides Standing yoga block between thighs with cues for pelvic positioning and rib position in standing Standing ball press for isometric core activation x 10 reps x 3 second holds   [facility] Adult PT Treatment:                                                DATE: 10/17/23 Therapeutic Exercise: Standing Palloff press yellow band x 5 reps R and L (attempted red band but too difficult)-- this fatigues patient in core musculature Patient needs cues for upright positioning and rib/lumbar posture Manual Therapy: STM bilateral cervical spine paraspinals, SCM and scalenes PROM neck flexion and lateral flexion Neuromuscular re-ed: Seated  Anterior and posterior pelvic tilt on physiodisc to encourage pelvic mobility Tactile and demo cues  for positioning Standing Breathing in with arms into abduction and out pressing down to return to side x 5 reps with tactile cues at ribs and verbal cues for lumbar positioning Supine Deep breathing cues with arms overhead and lowering on exhalation Therapeutic Activity: Standing step ups to 6 step up/up, down/down x 5 reps R and L sides Standing yoga block between thighs with cues for pelvic positioning and rib position in standing Gait: Gait activities working on postural upright with tactile and demo cues     PATIENT EDUCATION:  Education details: Updated HEP Person educated: Patient Education method: Programmer, multimedia, Facilities manager, and Handouts Education comprehension: verbalized understanding, returned demonstration, and needs further education  HOME EXERCISE PROGRAM: Access Code: [email or web address] URL: [email or web address] Date: 10/25/2023 Prepared by: Lamarr Price  Program Notes THESE SHOULD NOT INCREASE PAIN. If so, wait until you return to therapy.  Exercises - Supine Active Straight Leg Raise  - 1 x daily - 5 x weekly - 1 sets - 10 reps - Heel  Raises with Counter Support  - 1 x daily - 5 x weekly - 1 sets - 10 reps - Supine Diaphragmatic Breathing  - 1 x daily - 7 x weekly - 3 sets - 10 reps - Supine March  - 1 x daily - 7 x weekly - 3 sets - 10 reps - Beginner Bridge  - 1 x daily - 7 x weekly - 3 sets - 10 reps - Sit to Stand Without Arm Support  - 2-3 x daily - 7 x weekly - 1 sets - 5 reps - Seated Cervical Flexion AROM  - 1 x daily - 7 x weekly - 1 sets - 10 reps - Supine Cervical Rotation AROM on Pillow  - 1 x daily - 7 x weekly - 1 sets - 10 reps - Seated Isometric Cervical Sidebending  - 2 x daily - 7 x weekly - 1 sets - 10 reps - Supine Dead Bug with Leg Extension  - 1 x daily - 7 x weekly - 3 sets - 10 reps   ASSESSMENT: CLINICAL IMPRESSION: The patient is tolerating increased strengthening and standing activities today. Pain reduces with therapy with patient verbalizing less stiffness throughout. She fatigues with gait per functional reports, so PT assessed her baseline distance walked and it was 5.5 minutes before requiring a rest break. PT to continue to progress to LTGs.   EVAL: Patient is an 83  y.o. female who was seen today for physical therapy evaluation and treatment for compression fx and low back pain. Her pain varies from 5/10 up to 10/10 and is typically worse in the morning x first hour of waking, coughing, and after sitting and transitioning to stand. Evaluation was limited due to high irritability and precautions due to fx. She presents with impairments in ROM, strength, pain, and overall functional mobility. PT recommends lumbar brace due to characteristics of pain during evaluation (winces with coughing and sit<>stand)-- will reach out to request order.  GOALS: Goals reviewed with patient? Yes  SHORT TERM GOALS: Target date: 10/14/23   The patient will be indep with initial HEP Baseline: initiated at eval Goal status: MET  2.  The patient will report pain at worst 6/10 Baseline:  10/10 Goal status:  MET-- back pain at worst is 5/10  LONG TERM GOALS: Target date: 11/14/23   The patient will be indep with HEP progression. Baseline:  Initiated at eval Goal status: INITIAL  2.  The patient will improve modified oswestry score  by 12% Baseline: see above Goal status: INITIAL  3.  The patient will improve gait speed to 2.3 ft/sec Baseline: 1.52 ft/sec Goal status: INITIAL  4.  The patient will improve pain to < or equal to 2/10 at rest. Baseline:  5-6/10 Goal status: INITIAL PLAN:  PT FREQUENCY: 2x/week  PT DURATION: 8 weeks  PLANNED INTERVENTIONS: [id number]- PT Re-evaluation, 97750- Physical Performance Testing, 97110-Therapeutic exercises, 97530- Therapeutic activity, [id number]- Neuromuscular re-education, 97535- Self Care, [id number]- Manual therapy, [id number]- Gait training, [id number]- Aquatic Therapy, Patient/Family education, Balance training, Cryotherapy, and Moist heat.  PLAN FOR NEXT SESSION: More standing exercises to work on endurance, balance activities, promote lumbar curve awareness, LE strengthening to tolerance, progress to patient tolerance, gait training, return to functional tasks with modifications and education re: osteoporosis. Neck AROM and STM as needed.  Trunk lengthening and core engagement + breath support for functional tasks.   Nare Gaspari, PT 10/27/2023, 11:50 AM

## 2023-10-27 ENCOUNTER — Encounter: Payer: Self-pay | Admitting: Rehabilitative and Restorative Service Providers"

## 2023-10-27 ENCOUNTER — Ambulatory Visit: Admitting: Rehabilitative and Restorative Service Providers"

## 2023-10-27 DIAGNOSIS — M6281 Muscle weakness (generalized): Secondary | ICD-10-CM

## 2023-10-27 DIAGNOSIS — M5459 Other low back pain: Secondary | ICD-10-CM | POA: Diagnosis not present

## 2023-11-01 ENCOUNTER — Ambulatory Visit (HOSPITAL_BASED_OUTPATIENT_CLINIC_OR_DEPARTMENT_OTHER)
Admission: RE | Admit: 2023-11-01 | Discharge: 2023-11-01 | Disposition: A | Discharge status: Home / Self Care | Source: Ambulatory Visit

## 2023-11-01 ENCOUNTER — Ambulatory Visit: Admitting: Sports Medicine

## 2023-11-01 ENCOUNTER — Ambulatory Visit (INDEPENDENT_AMBULATORY_CARE_PROVIDER_SITE_OTHER)

## 2023-11-01 ENCOUNTER — Encounter: Payer: Self-pay | Admitting: Sports Medicine

## 2023-11-01 VITALS — BP 122/70 | Ht 66.0 in | Wt 145.0 lb

## 2023-11-01 DIAGNOSIS — M542 Cervicalgia: Secondary | ICD-10-CM

## 2023-11-01 NOTE — Progress Notes (Signed)
   Subjective:    Patient ID: Christy Gutierrez, female    DOB: 83 y.o., August 08, 1940   MRN: [id number]  HPI  Chief Complaint: Neck and back pain follow-up  She had a fall at the end of May and was initially referred for low back pain. Her low back has improved significantly.  Old L1 compression fracture visualized as well as some wedging of the L2 vertebra but mild.  L2 compression fracture noted at that time but unclear if this was acute or chronic in nature when initially visualized.  She is still getting neck pain and also notes overall dec'd endurance and imbalance. PT is working on addressing functional mobility to return to her prior level of function. She has shown increased strength and tolerance to standing tasks in the last 2 weeks.   Former Haematologist for many years Would twist her head looking over her left shoulder frequently while at work [employer] like this is caused problems with her neck over the years No pain radiating down the left or right upper extremity Pain is more localized to her neck No significant inciting injury that she can recall that it seems to slightly worse after she had the fall at the end of May. No x-rays of neck No physical therapy specifically targeting this recently No other medicines or treatments tried.    Objective:   Physical Exam Vitals:   11/01/23 1454  BP: 122/70   Cervical Spine -Inspection: no swelling or skin changes -Palpation: TTP - paraspinals, - midline -AROM/PROM: FROM in all planes of the neck -Strength: full neck flexion/extension (C1/C2), neck sidebending (C3), shoulder shrug (C4), shoulder abduction (C5), elbow flexion (C6), elbow extension (C7), thumb extension (C8/radial), finger abduction (T1/ulnar), OK sign (median) -Sensation: intact sensation of the thumb (C6), 2nd/3rd digits (C7), 4th/5th digits (C8). -Reflexes: normal biceps (C5/6), brachioradialis (C5/6), triceps (C7/8) reflexes, equal bilaterally -Special tests: - tornado test  (though does cause pain in the neck with passive range of motion)     Assessment & Plan:   Christy Gutierrez is a 83 y.o. female presenting with chronic longstanding neck pain.  She has a history of limited mobility in her neck which she attributes to a prior job as a Haematologist that involved a great deal of turning her neck backwards, but we have no plain film imaging of her neck and I wish to rectify this today.  Based on her exam, I do not believe that neuroforaminal stenosis is a significant contributor to her her pain.  And unfortunately, due to her comorbidities NSAID use is not a feasible option for her particularly for long-term use.  After discussion with Christy, we decided to start our search with x-rays, and based on the findings of this we may move to obtaining an MRI and referring her to a local spine specialist.  Follow-up results of x-ray.

## 2023-11-02 ENCOUNTER — Other Ambulatory Visit: Payer: Self-pay

## 2023-11-02 ENCOUNTER — Ambulatory Visit: Payer: Self-pay

## 2023-11-02 ENCOUNTER — Encounter: Payer: Self-pay | Admitting: Rehabilitative and Restorative Service Providers"

## 2023-11-02 ENCOUNTER — Ambulatory Visit: Attending: Family Medicine | Admitting: Rehabilitative and Restorative Service Providers"

## 2023-11-02 DIAGNOSIS — M5459 Other low back pain: Secondary | ICD-10-CM | POA: Diagnosis present

## 2023-11-02 DIAGNOSIS — M6281 Muscle weakness (generalized): Secondary | ICD-10-CM | POA: Diagnosis present

## 2023-11-02 DIAGNOSIS — M47812 Spondylosis without myelopathy or radiculopathy, cervical region: Secondary | ICD-10-CM

## 2023-11-02 DIAGNOSIS — M542 Cervicalgia: Secondary | ICD-10-CM

## 2023-11-02 NOTE — Therapy (Signed)
 OUTPATIENT PHYSICAL THERAPY THORACOLUMBAR TREATMENT  Patient Name: Christy Gutierrez MRN: [id number] DOB:1940-06-26, 83 y.o., female Today's Date: 11/02/2023  END OF SESSION:  PT End of Session - 11/02/23 1406     Visit Number 14    Number of Visits 16    Date for PT Re-Evaluation 11/13/23    Authorization Type [employer] medicare    PT Start Time 1406    PT Stop Time 1446    PT Time Calculation (min) 40 min    Activity Tolerance Patient tolerated treatment well    Behavior During Therapy WFL for tasks assessed/performed          Past Medical History:  Diagnosis Date   Atrial fibrillation (HCC)    Back pain    Hypertension    Past Surgical History:  Procedure Laterality Date   SKIN CANCER EXCISION  05/2022   on the chest   TONSILLECTOMY     TUBAL LIGATION     bilateral   Patient Active Problem List   Diagnosis Date Noted   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   Atrial fibrillation (HCC) 10/06/2022   Near syncope 09/03/2020   Pulmonary nodule, right 06/26/2019   MDD (major depressive disorder), recurrent episode (HCC) 12/26/2018   Emphysema lung (HCC) 03/28/2018   Episodic lightheadedness 08/08/2017   Essential hypertension 03/05/2016   Lumbar spondylosis 04/02/2015   Left knee pain 04/02/2015   Migraine 05/08/2013   Osteoporosis 11/10/2009   Vitamin D  deficiency 11/04/2009   Hyperlipidemia 11/04/2009   Backache 11/04/2009   FATIGUE 11/04/2009   Asymptomatic postmenopausal status 11/04/2009    PCP: Alvan Craven MD REFERRING PROVIDER: Alvan Craven MD REFERRING DIAG:  Diagnosis  S32.020A (ICD-10-CM) - Compression fracture of L2 vertebra, initial encounter (HCC)   Rationale for Evaluation and Treatment: Rehabilitation  THERAPY DIAG:  Other low back pain  Muscle weakness (generalized)  ONSET DATE: 09/13/23   SUBJECTIVE:                                                                                                                                                                                           SUBJECTIVE STATEMENT: The patient reports her pain in her neck was bad yesterday, but it is better today. She reports she walked a long distance at the [employer] game last week and could tolerate a longer walk with help from her family. She saw Dr. Charles yesterday and had an xray ordered.   EVAL: The patient reports that she had a fall on May 28 and hit her head and bumped her arm. She did not notice immediate back pain, but notes pain began 2 weeks later when  she mowed the yard on a [employer]. She is having significant pain that is constant in nature. She cannot get down on the floor (she would get down to pray or exercise prior to this pain) and notes difficulty trying to get up.   PERTINENT HISTORY:  Osteoporosis, H/o back pain from early 90s. A-fib, emphysema, migraine, HTN.  PAIN:  Are you having pain? Yes: NPRS scale: neck 3210 ; back pain is still present-- hurt this morning when carrying laundry basket up the stairs/10 Pain location: low back and neck Pain description: soreness, gets to be sharp pain and muscle spasms Aggravating factors: getting up after sitting Relieving factors: heating pad,   PRECAUTIONS: Back *precautions due to acute compression fx and osteoporosis  RED FLAGS: Compression fracture: Yes: fall on May 28 with pain beginning 2 weeks post fall   Bowel and bladder: denies issues Denies saddle paresthesias  WEIGHT BEARING RESTRICTIONS: No  FALLS:  Has patient fallen in last 6 months? Yes. Number of falls 1 time in May; she describes 2-3 over the past year  LIVING ENVIRONMENT: Lives with: lives alone Lives in: House/apartment Stairs: Yes: Internal: 12 to basement, 4-5 going into home;  steps; one rail Has following equipment at home: None  PLOF: Independent  PATIENT GOALS: reduce, walk upright, return to doing exercises  OBJECTIVE:  Note: Objective measures were completed at Evaluation unless  otherwise noted.  DIAGNOSTIC FINDINGS:  IMPRESSION: 1. New mild compression deformity of L2, possibly acute. 2. Stable L1 compression deformity. 3. Moderate multilevel degenerative change.   PATIENT SURVEYS:  Modified Oswestry: 54%  COGNITION: Overall cognitive status: Within functional limits for tasks assessed     SENSATION: WFL  POSTURE: No Significant postural limitations  PALPATION: None today-- due to   LUMBAR ROM: Deferred due to recent compression fx AROM eval  Flexion   Extension   Right lateral flexion   Left lateral flexion   Right rotation   Left rotation    (Blank rows = not tested)  LOWER EXTREMITY ROM:   WFLs Active  Right eval Left eval  Hip flexion    Hip extension    Hip abduction    Hip adduction    Hip internal rotation    Hip external rotation    Knee flexion    Knee extension    Ankle dorsiflexion    Ankle plantarflexion    Ankle inversion    Ankle eversion     (Blank rows = not tested)  LOWER EXTREMITY MMT:  deferred due to pain-- able to do SLR  MMT Right eval Left eval  Hip flexion    Hip extension    Hip abduction    Hip adduction    Hip internal rotation    Hip external rotation    Knee flexion    Knee extension    Ankle dorsiflexion    Ankle plantarflexion    Ankle inversion    Ankle eversion     (Blank rows = not tested)  LUMBAR SPECIAL TESTS:  Deferred due to compression fx  FUNCTIONAL TESTS:  Log roll for bed mobility instructed and performed  GAIT: Distance walked: 20/13.16 seconds=1.52 ft/sec Assistive device utilized: None Level of assistance: Complete Independence Comments: slowed pace Pain after walking when she first sits down   [facility] Adult PT Treatment:  DATE: 11/02/23 Therapeutic Exercise: Chin tucks pressing into a wall with ball for neck engagement x 5 reps Ls in standing with back to the wall x 5 reps Standing Rows with red band x 10  reps Shoulder extension with red band x 10 reps Neuromuscular re-ed: Standing in stride with alternating UE reaching Compliant surface standing Eyes open + head turns Eyes closed with CGA to min A Lateral step ups/downs x 10 reps R and L Posterior alternating steps x 10 reps with UE support Therapeutic Activity: Standing overhead reaching with yoga block between knees and holding a ball overhead Gait: 6 minutes of walking nonstop with cues for arm swing, breath support,    [facility] Adult PT Treatment:                                                DATE: 10/26/23 Therapeutic Exercise: Nu-step x 4.5 minutes level 2 Ues/LEs 5.5 minutes of walking non-stop  with cues for breathing and longer stride length Supine Deep breathing  with washcloth under L thoracic spine Head nods Standing Rows x 10 reps red band Shoulder extension red band x 5 reps R and L  Seated Shoulder rolls Neck AROM rotation Neck AROM flexion/extension Manual Therapy: Manual neck PROM with rotation and sidebending STM cervical paraspinals Neuromuscular re-ed: Standing core engagement  Palloff press x 10 reps red band with cues for core engagement Postural cues for upright using a wall for support Anatomical scapular press UE diagonals with yellow band against wall for postural cues and neuromuscular re-education   [facility] Adult PT Treatment:                                                DATE: 10/25/2023 Neuromuscular re-ed: Unilateral 90/90 heel taps Modified dead bug (leg extension to table) Seated cervical AROM in all directions to tolerance  Therapeutic Activity: Seated breathing + lateral arm reach up/down Supine diaphragmatic breathing + crossed arm raises for ribcage elevation Seated cat/cow (small range) with hands behind head --> ribcage cues Pec/thoracic extension  in doorway --> arms at 60 degrees --> arms extended low Gait: Gait training + reciprocal arm swing --> added 1#DB to promote momentum with  arms   [facility] Adult PT Treatment:                                                DATE: 10/20/23 Therapeutic Exercise: Quadriped Rocking anterior/posterior Initiating minimal pelvic tilts with some discomfort so discontinued Sidelying Hip abduction x 10 reps R and L sides Standing Yellow palloff press x 5 reps each direction Supine Beginner bridge x 10 reps Physioball rolllouts bilateral knees to chest x 10 reps Neuromuscular re-ed: Seated  Anterior and posterior pelvic tilt on physiodisc to encourage pelvic mobility Tactile and demo cues for positioning  Standing Breathing in with arms into abduction and out pressing down to return to side x 5 reps with tactile cues at ribs and verbal cues for lumbar positioning Compliant surface standing with eyes open and intermittent UE support with CGA Added alternating arm reaches on compliant surface with  CGA and intermittent UE support Eyes closed on compliant surfaces with CGA Stepping posteriorly off of compliant surfaces with UE support Therapeutic Activity: Standing step ups to 4 step x 5 reps R and L sides Standing yoga block between thighs with cues for pelvic positioning and rib position in standing Standing ball press for isometric core activation x 10 reps x 3 second holds   [facility] Adult PT Treatment:                                                DATE: 10/17/23 Therapeutic Exercise: Standing Palloff press yellow band x 5 reps R and L (attempted red band but too difficult)-- this fatigues patient in core musculature Patient needs cues for upright positioning and rib/lumbar posture Manual Therapy: STM bilateral cervical spine paraspinals, SCM and scalenes PROM neck flexion and lateral flexion Neuromuscular re-ed: Seated  Anterior and posterior pelvic tilt on physiodisc to encourage pelvic mobility Tactile and demo cues for positioning Standing Breathing in with arms into abduction and out pressing down to return to side x 5 reps  with tactile cues at ribs and verbal cues for lumbar positioning Supine Deep breathing cues with arms overhead and lowering on exhalation Therapeutic Activity: Standing step ups to 6 step up/up, down/down x 5 reps R and L sides Standing yoga block between thighs with cues for pelvic positioning and rib position in standing Gait: Gait activities working on postural upright with tactile and demo cues     PATIENT EDUCATION:  Education details: Updated HEP Person educated: Patient Education method: Programmer, multimedia, Facilities manager, and Handouts Education comprehension: verbalized understanding, returned demonstration, and needs further education  HOME EXERCISE PROGRAM: Access Code: [email or web address] URL: [email or web address] Date: 10/25/2023 Prepared by: Lamarr Price  Program Notes THESE SHOULD NOT INCREASE PAIN. If so, wait until you return to therapy.  Exercises - Supine Active Straight Leg Raise  - 1 x daily - 5 x weekly - 1 sets - 10 reps - Heel Raises with Counter Support  - 1 x daily - 5 x weekly - 1 sets - 10 reps - Supine Diaphragmatic Breathing  - 1 x daily - 7 x weekly - 3 sets - 10 reps - Supine March  - 1 x daily - 7 x weekly - 3 sets - 10 reps - Beginner Bridge  - 1 x daily - 7 x weekly - 3 sets - 10 reps - Sit to Stand Without Arm Support  - 2-3 x daily - 7 x weekly - 1 sets - 5 reps - Seated Cervical Flexion AROM  - 1 x daily - 7 x weekly - 1 sets - 10 reps - Supine Cervical Rotation AROM on Pillow  - 1 x daily - 7 x weekly - 1 sets - 10 reps - Seated Isometric Cervical Sidebending  - 2 x daily - 7 x weekly - 1 sets - 10 reps - Supine Dead Bug with Leg Extension  - 1 x daily - 7 x weekly - 3 sets - 10 reps   ASSESSMENT: CLINICAL IMPRESSION: The patient has reduced pain today as compared to yesterday. She notes imbalance as a primary concern for daily activiites. She continues to progress with strengthening activities and endurance. PT to continue working  towards LTGs.  EVAL: Patient is an 83  y.o. female who was seen today for physical therapy evaluation  and treatment for compression fx and low back pain. Her pain varies from 5/10 up to 10/10 and is typically worse in the morning x first hour of waking, coughing, and after sitting and transitioning to stand. Evaluation was limited due to high irritability and precautions due to fx. She presents with impairments in ROM, strength, pain, and overall functional mobility. PT recommends lumbar brace due to characteristics of pain during evaluation (winces with coughing and sit<>stand)-- will reach out to request order.  GOALS: Goals reviewed with patient? Yes  SHORT TERM GOALS: Target date: 10/14/23   The patient will be indep with initial HEP Baseline: initiated at eval Goal status: MET  2.  The patient will report pain at worst 6/10 Baseline:  10/10 Goal status: MET-- back pain at worst is 5/10  LONG TERM GOALS: Target date: 11/14/23   The patient will be indep with HEP progression. Baseline:  Initiated at eval Goal status: INITIAL  2.  The patient will improve modified oswestry score by 12% Baseline: see above Goal status: INITIAL  3.  The patient will improve gait speed to 2.3 ft/sec Baseline: 1.52 ft/sec Goal status: INITIAL  4.  The patient will improve pain to < or equal to 2/10 at rest. Baseline:  5-6/10 Goal status: INITIAL PLAN:  PT FREQUENCY: 2x/week  PT DURATION: 8 weeks  PLANNED INTERVENTIONS: [id number]- PT Re-evaluation, 97750- Physical Performance Testing, 97110-Therapeutic exercises, 97530- Therapeutic activity, [id number]- Neuromuscular re-education, 97535- Self Care, [id number]- Manual therapy, [id number]- Gait training, [phone]- Aquatic Therapy, Patient/Family education, Balance training, Cryotherapy, and Moist heat.  PLAN FOR NEXT SESSION: More standing exercises to work on endurance, balance activities, promote lumbar curve awareness, LE strengthening to tolerance, progress to  patient tolerance, gait training, return to functional tasks with modifications and education re: osteoporosis. Neck AROM and STM as needed.  Trunk lengthening and core engagement + breath support for functional tasks.   Blayre Papania, PT 11/02/2023, 2:06 PM

## 2023-11-07 ENCOUNTER — Ambulatory Visit: Admitting: Rehabilitative and Restorative Service Providers"

## 2023-11-07 ENCOUNTER — Encounter: Payer: Self-pay | Admitting: Rehabilitative and Restorative Service Providers"

## 2023-11-07 DIAGNOSIS — M6281 Muscle weakness (generalized): Secondary | ICD-10-CM

## 2023-11-07 DIAGNOSIS — M5459 Other low back pain: Secondary | ICD-10-CM

## 2023-11-07 NOTE — Therapy (Signed)
 OUTPATIENT PHYSICAL THERAPY THORACOLUMBAR TREATMENT  Patient Name: Arali Somera MRN: [id number] DOB:11/27/1940, 83 y.o., female Today's Date: 11/07/2023  END OF SESSION:  PT End of Session - 11/07/23 1318     Visit Number 15    Number of Visits 16    Date for PT Re-Evaluation 11/13/23    Authorization Type [employer] medicare    Progress Note Due on Visit 20    PT Start Time 1320    PT Stop Time 1400    PT Time Calculation (min) 40 min    Activity Tolerance Patient tolerated treatment well    Behavior During Therapy WFL for tasks assessed/performed          Past Medical History:  Diagnosis Date   Atrial fibrillation (HCC)    Back pain    Hypertension    Past Surgical History:  Procedure Laterality Date   SKIN CANCER EXCISION  05/2022   on the chest   TONSILLECTOMY     TUBAL LIGATION     bilateral   Patient Active Problem List   Diagnosis Date Noted   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   Atrial fibrillation (HCC) 10/06/2022   Near syncope 09/03/2020   Pulmonary nodule, right 06/26/2019   MDD (major depressive disorder), recurrent episode (HCC) 12/26/2018   Emphysema lung (HCC) 03/28/2018   Episodic lightheadedness 08/08/2017   Essential hypertension 03/05/2016   Lumbar spondylosis 04/02/2015   Left knee pain 04/02/2015   Migraine 05/08/2013   Osteoporosis 11/10/2009   Vitamin D  deficiency 11/04/2009   Hyperlipidemia 11/04/2009   Backache 11/04/2009   FATIGUE 11/04/2009   Asymptomatic postmenopausal status 11/04/2009    PCP: Alvan Craven MD REFERRING PROVIDER: Alvan Craven MD REFERRING DIAG:  Diagnosis  S32.020A (ICD-10-CM) - Compression fracture of L2 vertebra, initial encounter (HCC)   Rationale for Evaluation and Treatment: Rehabilitation  THERAPY DIAG:  Other low back pain  Muscle weakness (generalized)  ONSET DATE: 09/13/23   SUBJECTIVE:                                                                                                                                                                                           SUBJECTIVE STATEMENT: The patient reports her neck is tight and stiff this afternoon. She had good movement this weekend. The patient reports she did well at church yesterday and walked further.   EVAL: The patient reports that she had a fall on May 28 and hit her head and bumped her arm. 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 She is having  significant pain that is constant in nature. She cannot get down on the floor (she would get down to pray or exercise prior to this pain) and notes difficulty trying to get up.   PERTINENT HISTORY:  Osteoporosis, H/o back pain from early 90s. A-fib, emphysema, migraine, HTN.  PAIN:  Are you having pain? Yes: NPRS scale: neck : 210 ; back pain is still present-- hurt this morning when carrying laundry basket up the stairs/10 Pain location: low back and neck Pain description: soreness, gets to be sharp pain and muscle spasms Aggravating factors: getting up after sitting Relieving factors: heating pad,   PRECAUTIONS: Back *precautions due to acute compression fx and osteoporosis  RED FLAGS: Compression fracture: Yes: fall on May 28 with pain beginning 2 weeks post fall   Bowel and bladder: denies issues Denies saddle paresthesias  WEIGHT BEARING RESTRICTIONS: No  FALLS:  Has patient fallen in last 6 months? Yes. Number of falls 1 time in May; she describes 2-3 over the past year  LIVING ENVIRONMENT: Lives with: lives alone Lives in: House/apartment Stairs: Yes: Internal: 12 to basement, 4-5 going into home;  steps; one rail Has following equipment at home: None  PLOF: Independent  PATIENT GOALS: reduce, walk upright, return to doing exercises  OBJECTIVE:  Note: Objective measures were completed at Evaluation unless otherwise noted.  DIAGNOSTIC FINDINGS:  IMPRESSION: 1. New mild  compression deformity of L2, possibly acute. 2. Stable L1 compression deformity. 3. Moderate multilevel degenerative change.   PATIENT SURVEYS:  Modified Oswestry: 54%  COGNITION: Overall cognitive status: Within functional limits for tasks assessed     SENSATION: WFL  POSTURE: No Significant postural limitations  PALPATION: None today-- due to   LUMBAR ROM: Deferred due to recent compression fx AROM eval  Flexion   Extension   Right lateral flexion   Left lateral flexion   Right rotation   Left rotation    (Blank rows = not tested)  LOWER EXTREMITY ROM:   WFLs Active  Right eval Left eval  Hip flexion    Hip extension    Hip abduction    Hip adduction    Hip internal rotation    Hip external rotation    Knee flexion    Knee extension    Ankle dorsiflexion    Ankle plantarflexion    Ankle inversion    Ankle eversion     (Blank rows = not tested)  LOWER EXTREMITY MMT:  deferred due to pain-- able to do SLR  MMT Right eval Left eval  Hip flexion    Hip extension    Hip abduction    Hip adduction    Hip internal rotation    Hip external rotation    Knee flexion    Knee extension    Ankle dorsiflexion    Ankle plantarflexion    Ankle inversion    Ankle eversion     (Blank rows = not tested)  LUMBAR SPECIAL TESTS:  Deferred due to compression fx  FUNCTIONAL TESTS:  Log roll for bed mobility instructed and performed  GAIT: Distance walked: 20/13.16 seconds=1.52 ft/sec Assistive device utilized: None Level of assistance: Complete Independence Comments: slowed pace Pain after walking when she first sits down  [facility] Adult PT Treatment:  DATE: 11/07/23 Therapeutic Exercise: Supine Chin tucks with towel roll  Chin tuck with neck rotation x 10 reps Horizontal abduction bilat for chest opening Standing W x 10 reps with cues on upright posture Ls x 10 reps with cues on posture Seated Shoulder rolls for  mobilization Neck AROM flexion to neutral x 10 reps Therapeutic Activity: Dead bug for core engagement in abdominals, back, and neck x 10 reps Sit<>stand x 10 reps dec'ing UE support Standing overhead reaching x 10 reps 1# in each hand Towel slides up/down wall x 10 reps Step ups to 4 step x 10 reps R and L with bilat UE support Gait: Gait carrying 5# kettle bell in R hand x 80 feet, L hand x 80 feet   [facility] Adult PT Treatment:                                                DATE: 11/02/23 Therapeutic Exercise: Chin tucks pressing into a wall with ball for neck engagement x 5 reps Ls in standing with back to the wall x 5 reps Standing Rows with red band x 10 reps Shoulder extension with red band x 10 reps Neuromuscular re-ed: Standing in stride with alternating UE reaching Compliant surface standing Eyes open + head turns Eyes closed with CGA to min A Lateral step ups/downs x 10 reps R and L Posterior alternating steps x 10 reps with UE support Therapeutic Activity: Standing overhead reaching with yoga block between knees and holding a ball overhead Gait: 6 minutes of walking nonstop with cues for arm swing, breath support,    [facility] Adult PT Treatment:                                                DATE: 10/26/23 Therapeutic Exercise: Nu-step x 4.5 minutes level 2 Ues/LEs 5.5 minutes of walking non-stop  with cues for breathing and longer stride length Supine Deep breathing  with washcloth under L thoracic spine Head nods Standing Rows x 10 reps red band Shoulder extension red band x 5 reps R and L  Seated Shoulder rolls Neck AROM rotation Neck AROM flexion/extension Manual Therapy: Manual neck PROM with rotation and sidebending STM cervical paraspinals Neuromuscular re-ed: Standing core engagement  Palloff press x 10 reps red band with cues for core engagement Postural cues for upright using a wall for support Anatomical scapular press UE diagonals with yellow band  against wall for postural cues and neuromuscular re-education   [facility] Adult PT Treatment:                                                DATE: 10/25/2023 Neuromuscular re-ed: Unilateral 90/90 heel taps Modified dead bug (leg extension to table) Seated cervical AROM in all directions to tolerance  Therapeutic Activity: Seated breathing + lateral arm reach up/down Supine diaphragmatic breathing + crossed arm raises for ribcage elevation Seated cat/cow (small range) with hands behind head --> ribcage cues Pec/thoracic extension  in doorway --> arms at 60 degrees --> arms extended low Gait: Gait training + reciprocal arm swing --> added  1#DB to promote momentum with arms    PATIENT EDUCATION:  Education details: Updated HEP Person educated: Patient Education method: Explanation, Demonstration, and Handouts Education comprehension: verbalized understanding, returned demonstration, and needs further education  HOME EXERCISE PROGRAM: Access Code: [email or web address] URL: [email or web address] Date: 10/25/2023 Prepared by: Lamarr Price  Program Notes THESE SHOULD NOT INCREASE PAIN. If so, wait until you return to therapy.  Exercises - Supine Active Straight Leg Raise  - 1 x daily - 5 x weekly - 1 sets - 10 reps - Heel Raises with Counter Support  - 1 x daily - 5 x weekly - 1 sets - 10 reps - Supine Diaphragmatic Breathing  - 1 x daily - 7 x weekly - 3 sets - 10 reps - Supine March  - 1 x daily - 7 x weekly - 3 sets - 10 reps - Beginner Bridge  - 1 x daily - 7 x weekly - 3 sets - 10 reps - Sit to Stand Without Arm Support  - 2-3 x daily - 7 x weekly - 1 sets - 5 reps - Seated Cervical Flexion AROM  - 1 x daily - 7 x weekly - 1 sets - 10 reps - Supine Cervical Rotation AROM on Pillow  - 1 x daily - 7 x weekly - 1 sets - 10 reps - Seated Isometric Cervical Sidebending  - 2 x daily - 7 x weekly - 1 sets - 10 reps - Supine Dead Bug with Leg Extension  - 1 x daily - 7 x weekly - 3 sets  - 10 reps   ASSESSMENT: CLINICAL IMPRESSION: The patient had reduced pain during session today. She notes a jittery sensation as she performs standing exercises in therapy. PT varied between standing LE and UE activities to reduce fatigue and jittery sensation. PT plans to renew updating goals at next session. Plan to continue x 8 more weeks.  EVAL: Patient is an 83  y.o. female who was seen today for physical therapy evaluation and treatment for compression fx and low back pain. Her pain varies from 5/10 up to 10/10 and is typically worse in the morning x first hour of waking, coughing, and after sitting and transitioning to stand. Evaluation was limited due to high irritability and precautions due to fx. She presents with impairments in ROM, strength, pain, and overall functional mobility. PT recommends lumbar brace due to characteristics of pain during evaluation (winces with coughing and sit<>stand)-- will reach out to request order.  GOALS: Goals reviewed with patient? Yes  SHORT TERM GOALS: Target date: 10/14/23   The patient will be indep with initial HEP Baseline: initiated at eval Goal status: MET  2.  The patient will report pain at worst 6/10 Baseline:  10/10 Goal status: MET-- back pain at worst is 5/10  LONG TERM GOALS: Target date: 11/14/23   The patient will be indep with HEP progression. Baseline:  Initiated at eval Goal status: MET  2.  The patient will improve modified oswestry score by 12% Baseline: see above Goal status: MET (52% DOWN TO 34% ON 11/07/23)  3.  The patient will improve gait speed to 2.3 ft/sec Baseline: 1.52 ft/sec Goal status: INITIAL  4.  The patient will improve pain to < or equal to 2/10 at rest. Baseline:  5-6/10 Goal status: PARTIALLY MET: 3/10 ON 11/07/23 PLAN:  PT FREQUENCY: 2x/week  PT DURATION: 8 weeks  PLANNED INTERVENTIONS: 97164- PT Re-evaluation, 97750- Physical Performance Testing, 97110-Therapeutic exercises, 97530-  Therapeutic activity,  02887- Neuromuscular re-education, [phone]- Self Care, [id number]- Manual therapy, [id number]- Gait training, [phone]- Aquatic Therapy, Patient/Family education, Balance training, Cryotherapy, and Moist heat.  PLAN FOR NEXT SESSION: More standing exercises to work on endurance, balance activities, promote lumbar curve awareness, LE strengthening to tolerance, progress to patient tolerance, gait training, return to functional tasks with modifications and education re: osteoporosis. Neck AROM and STM as needed.  Trunk lengthening and core engagement + breath support for functional tasks. RENEW NEXT VISIT X 8 MORE WEEKS. CHECK BALANCE, TUG, GAIT SPEED, AND PAIN FOR NECK.   Denine Brotz, PT 11/07/2023, 1:21 PM

## 2023-11-09 ENCOUNTER — Ambulatory Visit

## 2023-11-09 DIAGNOSIS — S32020A Wedge compression fracture of second lumbar vertebra, initial encounter for closed fracture: Secondary | ICD-10-CM

## 2023-11-10 ENCOUNTER — Encounter: Payer: Self-pay | Admitting: Rehabilitative and Restorative Service Providers"

## 2023-11-10 ENCOUNTER — Ambulatory Visit: Admitting: Rehabilitative and Restorative Service Providers"

## 2023-11-10 DIAGNOSIS — M5459 Other low back pain: Secondary | ICD-10-CM | POA: Diagnosis not present

## 2023-11-10 DIAGNOSIS — M6281 Muscle weakness (generalized): Secondary | ICD-10-CM

## 2023-11-10 NOTE — Therapy (Signed)
 OUTPATIENT PHYSICAL THERAPY THORACOLUMBAR TREATMENT and RECERTIFICATION  Patient Name: Cecila Satcher MRN: [id number] DOB:[birth year], 83 y.o., female Today's Date: 11/10/2023  END OF SESSION:  PT End of Session - 11/10/23 1316     Visit Number 16    Number of Visits 16    Date for PT Re-Evaluation 11/13/23    Authorization Type [employer] medicare    Progress Note Due on Visit 20    PT Start Time 1316    PT Stop Time 1400    PT Time Calculation (min) 44 min    Activity Tolerance Patient tolerated treatment well    Behavior During Therapy WFL for tasks assessed/performed          Past Medical History:  Diagnosis Date   Atrial fibrillation (HCC)    Back pain    Hypertension    Past Surgical History:  Procedure Laterality Date   SKIN CANCER EXCISION  05/2022   on the chest   TONSILLECTOMY     TUBAL LIGATION     bilateral   Patient Active Problem List   Diagnosis Date Noted   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   Atrial fibrillation (HCC) 10/06/2022   Near syncope 09/03/2020   Pulmonary nodule, right 06/26/2019   MDD (major depressive disorder), recurrent episode (HCC) 12/26/2018   Emphysema lung (HCC) 03/28/2018   Episodic lightheadedness 08/08/2017   Essential hypertension 03/05/2016   Lumbar spondylosis 04/02/2015   Left knee pain 04/02/2015   Migraine 05/08/2013   Osteoporosis 11/10/2009   Vitamin D  deficiency 11/04/2009   Hyperlipidemia 11/04/2009   Backache 11/04/2009   FATIGUE 11/04/2009   Asymptomatic postmenopausal status 11/04/2009    PCP: Alvan Craven MD REFERRING PROVIDER: Alvan Craven MD REFERRING DIAG:  Diagnosis  S32.020A (ICD-10-CM) - Compression fracture of L2 vertebra, initial encounter (HCC)   Rationale for Evaluation and Treatment: Rehabilitation  THERAPY DIAG:  Other low back pain  Muscle weakness (generalized)  ONSET DATE: 09/13/23   SUBJECTIVE:                                                                                                                                                                                           SUBJECTIVE STATEMENT: The patient reports she was doing good with her neck and back, but got stung by bees this morning and her neck is sore now. She got stung on her L hand and her back. She has used cortisone cream to reduce pain, but still sore.   EVAL: The patient reports that she had a fall on May 28 and hit her head and bumped her arm. She did not notice immediate back pain, but  notes pain began 2 weeks later when she mowed the yard on a riding [employer]. She is having significant pain that is constant in nature. She cannot get down on the floor (she would get down to pray or exercise prior to this pain) and notes difficulty trying to get up.   PERTINENT HISTORY:  Osteoporosis, H/o back pain from early 90s. A-fib, emphysema, migraine, HTN.  PAIN:  Are you having pain? Yes: NPRS scale: neck : 210 ; back pain is still present-- hurt this morning when carrying laundry basket up the stairs/10 Pain location: low back and neck Pain description: soreness, gets to be sharp pain and muscle spasms Aggravating factors: getting up after sitting Relieving factors: heating pad,   PRECAUTIONS: Back *precautions due to acute compression fx and osteoporosis  RED FLAGS: Compression fracture: Yes: fall on May 28 with pain beginning 2 weeks post fall   Bowel and bladder: denies issues Denies saddle paresthesias  WEIGHT BEARING RESTRICTIONS: No  FALLS:  Has patient fallen in last 6 months? Yes. Number of falls 1 time in May; she describes 2-3 over the past year  LIVING ENVIRONMENT: Lives with: lives alone Lives in: House/apartment Stairs: Yes: Internal: 12 to basement, 4-5 going into home;  steps; one rail Has following equipment at home: None  PLOF: Independent  PATIENT GOALS: reduce, walk upright, return to doing exercises  OBJECTIVE:  Note: Objective measures were completed at Evaluation  unless otherwise noted.  DIAGNOSTIC FINDINGS:  IMPRESSION: 1. New mild compression deformity of L2, possibly acute. 2. Stable L1 compression deformity. 3. Moderate multilevel degenerative change.   PATIENT SURVEYS:  Modified Oswestry: 54%  COGNITION: Overall cognitive status: Within functional limits for tasks assessed     SENSATION: WFL  POSTURE: No Significant postural limitations  PALPATION: None today-- due to   LUMBAR ROM: Deferred due to recent compression fx AROM eval  Flexion   Extension   Right lateral flexion   Left lateral flexion   Right rotation   Left rotation    (Blank rows = not tested)  LOWER EXTREMITY ROM:   WFLs  LOWER EXTREMITY MMT:  deferred due to pain-- able to do SLR  MMT Right eval Left eval  Hip flexion    Hip extension    Hip abduction    Hip adduction    Hip internal rotation    Hip external rotation    Knee flexion    Knee extension    Ankle dorsiflexion    Ankle plantarflexion    Ankle inversion    Ankle eversion     (Blank rows = not tested)  LUMBAR SPECIAL TESTS:  Deferred due to compression fx  FUNCTIONAL TESTS:  Log roll for bed mobility instructed and performed  GAIT: Distance walked: 20/13.16 seconds=1.52 ft/sec Assistive device utilized: None Level of assistance: Complete Independence Comments: slowed pace Pain after walking when she first sits down   [facility] Adult PT Treatment:                                                DATE: 11/10/23 Therapeutic Exercise: Cervical AROM: 58 deg R and 30 deg L rotation Cervical AROM:  28 deg R and 10 deg L  MMT=4/5 throughout Les Neuromuscular re-ed: Berg=44/56  [facility] PT Assessment - 11/10/23 1342       Balance   Balance Assessed  Yes      Standardized Balance Assessment   Standardized Balance Assessment Berg Balance Test      Berg Balance Test   Sit to Stand Able to stand  independently using hands    Standing Unsupported Able to stand safely 2 minutes    Sitting  with Back Unsupported but Feet Supported on Floor or Stool Able to sit safely and securely 2 minutes    Stand to Sit Controls descent by using hands    Transfers Able to transfer safely, definite need of hands    Standing Unsupported with Eyes Closed Able to stand 10 seconds safely    Standing Unsupported with Feet Together Able to place feet together independently and stand 1 minute safely    From Standing, Reach Forward with Outstretched Arm Can reach forward >12 cm safely (5)    From Standing Position, Pick up Object from Floor Able to pick up shoe safely and easily    From Standing Position, Turn to Look Behind Over each Shoulder Turn sideways only but maintains balance    Turn 360 Degrees Able to turn 360 degrees safely in 4 seconds or less    Standing Unsupported, Alternately Place Feet on Step/Stool Able to complete 4 steps without aid or supervision    Standing Unsupported, One Foot in Front Able to plae foot ahead of the other independently and hold 30 seconds    Standing on One Leg Tries to lift leg/unable to hold 3 seconds but remains standing independently    Total Score 44    Berg comment: 44/56         Alternating toe touches to 6 step RLE and LLE without UE support Sidestepping with Ues abducted for core engagement Therapeutic Activity: Ambulated 5 minutes nonstop x 740 feet  Sit<>stand decreasing UE support Step ups to 4 step x 10 reps with bilat UE support RLE and LLE Standing heel raises off edge of step with bilat UE support Standing ankle DF x 10 reps with intermittent UE support 5 time sit<>stand=19.91 seconds with UE use Gait: 2.92 ft/sec gait speed   [facility] Adult PT Treatment:                                                DATE: 11/07/23 Therapeutic Exercise: Supine Chin tucks with towel roll  Chin tuck with neck rotation x 10 reps Horizontal abduction bilat for chest opening Standing W x 10 reps with cues on upright posture Ls x 10 reps with cues on  posture Seated Shoulder rolls for mobilization Neck AROM flexion to neutral x 10 reps Therapeutic Activity: Dead bug for core engagement in abdominals, back, and neck x 10 reps Sit<>stand x 10 reps dec'ing UE support Standing overhead reaching x 10 reps 1# in each hand Towel slides up/down wall x 10 reps Step ups to 4 step x 10 reps R and L with bilat UE support Gait: Gait carrying 5# kettle bell in R hand x 80 feet, L hand x 80 feet   [facility] Adult PT Treatment:                                                DATE: 11/02/23 Therapeutic Exercise: Lesleigh tucks  pressing into a wall with ball for neck engagement x 5 reps Ls in standing with back to the wall x 5 reps Standing Rows with red band x 10 reps Shoulder extension with red band x 10 reps Neuromuscular re-ed: Standing in stride with alternating UE reaching Compliant surface standing Eyes open + head turns Eyes closed with CGA to min A Lateral step ups/downs x 10 reps R and L Posterior alternating steps x 10 reps with UE support Therapeutic Activity: Standing overhead reaching with yoga block between knees and holding a ball overhead Gait: 6 minutes of walking nonstop with cues for arm swing, breath support,   PATIENT EDUCATION:  Education details: Updated HEP Person educated: Patient Education method: Explanation, Demonstration, and Handouts Education comprehension: verbalized understanding, returned demonstration, and needs further education  HOME EXERCISE PROGRAM: Access Code: [email or web address] URL: [email or web address] Date: 10/25/2023 Prepared by: Lamarr Price  Program Notes THESE SHOULD NOT INCREASE PAIN. If so, wait until you return to therapy.  Exercises - Supine Active Straight Leg Raise  - 1 x daily - 5 x weekly - 1 sets - 10 reps - Heel Raises with Counter Support  - 1 x daily - 5 x weekly - 1 sets - 10 reps - Supine Diaphragmatic Breathing  - 1 x daily - 7 x weekly - 3 sets - 10 reps - Supine March   - 1 x daily - 7 x weekly - 3 sets - 10 reps - Beginner Bridge  - 1 x daily - 7 x weekly - 3 sets - 10 reps - Sit to Stand Without Arm Support  - 2-3 x daily - 7 x weekly - 1 sets - 5 reps - Seated Cervical Flexion AROM  - 1 x daily - 7 x weekly - 1 sets - 10 reps - Supine Cervical Rotation AROM on Pillow  - 1 x daily - 7 x weekly - 1 sets - 10 reps - Seated Isometric Cervical Sidebending  - 2 x daily - 7 x weekly - 1 sets - 10 reps - Supine Dead Bug with Leg Extension  - 1 x daily - 7 x weekly - 3 sets - 10 reps   ASSESSMENT: CLINICAL IMPRESSION: The patient has met STGs and LTGs. PT assessed balance today through Berg score of 44/56, 5 time sit<>stand, and gait speed. Plan to continue to emphasize neck AROM, balance, general strength, and endurance. PT requesting 4 more weeks to focus on improving functional mobility and IADL performance.   EVAL: Patient is an 83  y.o. female who was seen today for physical therapy evaluation and treatment for compression fx and low back pain. Her pain varies from 5/10 up to 10/10 and is typically worse in the morning x first hour of waking, coughing, and after sitting and transitioning to stand. Evaluation was limited due to high irritability and precautions due to fx. She presents with impairments in ROM, strength, pain, and overall functional mobility. PT recommends lumbar brace due to characteristics of pain during evaluation (winces with coughing and sit<>stand)-- will reach out to request order.  GOALS: Goals reviewed with patient? Yes  SHORT TERM GOALS: Target date: 10/14/23   The patient will be indep with initial HEP Baseline: initiated at eval Goal status: MET  2.  The patient will report pain at worst 6/10 Baseline:  10/10 Goal status: MET-- back pain at worst is 5/10  LONG TERM GOALS: Target date: 11/14/23   The patient will be indep with HEP progression. Baseline:  Initiated at eval Goal status: MET  2.  The patient will improve modified  oswestry score by 12% Baseline: see above Goal status: MET (52% DOWN TO 34% ON 11/07/23)  3.  The patient will improve gait speed to 2.3 ft/sec Baseline: 1.52 ft/sec Goal status: MET=2.92 ft/sec   4.  The patient will improve pain to < or equal to 2/10 at rest. Baseline:  5-6/10 Goal status: MET on 11/10/23  UPDATED LONG TERM GOALS:  LONG TERM GOALS: Target date: 12/09/23  The patient will be indep with HEP progression. Baseline:  performing current HEP Goal status: REVISED  2.  The patient will improve Berg balance scale to > or equal to 48/56. Baseline: 44/56 on 11/10/23 Goal status: NEW  3.  The patient will improve 5 time sit<>stand to < or equal to 16 seconds. Baseline: 19.91 seconds Goal status: NEW  4.  The patient will improve neck AROM L rotation to 45 degrees, and L SB to 20 degrees. Baseline:  see above Goal status: NEW  PLAN:  PT FREQUENCY: 2x/week  PT DURATION: 4 weeks  PLANNED INTERVENTIONS: 97164- PT Re-evaluation, 97750- Physical Performance Testing, 97110-Therapeutic exercises, 97530- Therapeutic activity, [id number]- Neuromuscular re-education, 97535- Self Care, [id number]- Manual therapy, [id number]- Gait training, [phone]- Aquatic Therapy, Patient/Family education, Balance training, Cryotherapy, and Moist heat.  PLAN FOR NEXT SESSION: More standing exercises to work on endurance, balance activities, promote lumbar curve awareness, LE strengthening to tolerance, progress to patient tolerance, gait training, return to functional tasks with modifications and education re: osteoporosis. Neck AROM and STM as needed.  Trunk lengthening and core engagement + breath support for functional tasks.   Leor Whyte, PT 11/10/2023, 1:17 PM

## 2023-11-11 ENCOUNTER — Ambulatory Visit: Payer: Self-pay | Admitting: Family Medicine

## 2023-11-11 NOTE — Progress Notes (Signed)
 Hi Shine, bone density test shows that your T-score is -3.7 in your arm.  This puts you into the category of osteoporosis.  Please continue with your calcium and vitamin D  supplementation in addition to weightbearing exercise using things like bands and light weights and walking to improve bone strength.   The current recommendation for osteoporosis treatment includes:   #1 calcium-total of 1200 mg of calcium daily.  If you eat a very calcium rich diet you may be able to obtain that without a supplement.  If not, then I recommend calcium 500 mg twice a day.  There are several products over-the-counter such as Caltrate D and Viactiv chews which are great options that contain calcium and vitamin D . #2 vitamin D -recommend 800 international units daily. #3 exercise-recommend 30 minutes of weightbearing exercise 3 days a week.  Resistance training ,such as doing bands and light weights, can be particularly helpful. #4 medication-if you are not currently on a bone builder, also called a bisphosphonate, then this has been shown to be very helpful in maintaining bone strength, preventing further thinning of the bones, and reducing your risk for fractures.  I would highly recommend that you consider starting 1 of these medications.  If you are okay with that then please let us  know and we will send one to your pharmacy.  If you would like to discuss further we are happy to make an appointment for you so that we can go over options for treatment.

## 2023-11-12 ENCOUNTER — Other Ambulatory Visit: Payer: Self-pay | Admitting: Family Medicine

## 2023-11-12 DIAGNOSIS — I482 Chronic atrial fibrillation, unspecified: Secondary | ICD-10-CM

## 2023-11-14 ENCOUNTER — Ambulatory Visit

## 2023-11-14 DIAGNOSIS — M542 Cervicalgia: Secondary | ICD-10-CM | POA: Diagnosis not present

## 2023-11-14 DIAGNOSIS — M47812 Spondylosis without myelopathy or radiculopathy, cervical region: Secondary | ICD-10-CM

## 2023-11-15 ENCOUNTER — Ambulatory Visit: Payer: Self-pay | Admitting: Rehabilitative and Restorative Service Providers"

## 2023-11-15 ENCOUNTER — Encounter: Payer: Self-pay | Admitting: Rehabilitative and Restorative Service Providers"

## 2023-11-15 DIAGNOSIS — M5459 Other low back pain: Secondary | ICD-10-CM | POA: Diagnosis not present

## 2023-11-15 DIAGNOSIS — M6281 Muscle weakness (generalized): Secondary | ICD-10-CM

## 2023-11-15 NOTE — Therapy (Unsigned)
 OUTPATIENT PHYSICAL THERAPY THORACOLUMBAR TREATMENT   Patient Name: Tonita Bills MRN: [id number] DOB:1940/05/09, 83 y.o., female Today's Date: 11/15/2023  END OF SESSION:  PT End of Session - 11/15/23 1147     Visit Number 17    Number of Visits 24    Date for PT Re-Evaluation 12/09/23    Authorization Type aetna medicare    Progress Note Due on Visit 20    PT Start Time 1148    PT Stop Time 1230    PT Time Calculation (min) 42 min    Activity Tolerance Patient tolerated treatment well    Behavior During Therapy WFL for tasks assessed/performed          Past Medical History:  Diagnosis Date   Atrial fibrillation (HCC)    Back pain    Hypertension    Past Surgical History:  Procedure Laterality Date   SKIN CANCER EXCISION  05/2022   on the chest   TONSILLECTOMY     TUBAL LIGATION     bilateral   Patient Active Problem List   Diagnosis Date Noted   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   Atrial fibrillation (HCC) 10/06/2022   Near syncope 09/03/2020   Pulmonary nodule, right 06/26/2019   MDD (major depressive disorder), recurrent episode (HCC) 12/26/2018   Emphysema lung (HCC) 03/28/2018   Episodic lightheadedness 08/08/2017   Essential hypertension 03/05/2016   Lumbar spondylosis 04/02/2015   Left knee pain 04/02/2015   Migraine 05/08/2013   Osteoporosis 11/10/2009   Vitamin D  deficiency 11/04/2009   Hyperlipidemia 11/04/2009   Backache 11/04/2009   FATIGUE 11/04/2009   Asymptomatic postmenopausal status 11/04/2009   PCP: Alvan Craven MD REFERRING PROVIDER: Alvan Craven MD REFERRING DIAG:  Diagnosis  S32.020A (ICD-10-CM) - Compression fracture of L2 vertebra, initial encounter (HCC)   Rationale for Evaluation and Treatment: Rehabilitation  THERAPY DIAG:  Other low back pain  Muscle weakness (generalized)  ONSET DATE: 09/13/23   SUBJECTIVE:                                                                                                                                                                                           SUBJEtCTIVE STATEMENT: The patient had an MRI on her neck. The left side is still painful. She has been getting back to more chores around the house. The most aggravating pain is L side pain every time she turns to the left. The back no  longer hurts.   EVAL: The patient reports that she had a fall on May 28 and hit her head and bumped her arm. 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 She is having significant pain that is constant in nature. She cannot get down on the floor (she would get down to pray or exercise prior to this pain) and notes difficulty trying to get up.   PERTINENT HISTORY:  Osteoporosis, H/o back pain from early 90s. A-fib, emphysema, migraine, HTN.  PAIN:  Are you having pain? Yes: NPRS scale: neck : 2/10 ;  Pain location:  neck Pain description: soreness, gets to be sharp pain and muscle spasms Aggravating factors: getting up after sitting Relieving factors: heating pad,   PRECAUTIONS: Back *precautions due to acute compression fx and osteoporosis  RED FLAGS: Compression fracture: Yes: fall on May 28 with pain beginning 2 weeks post fall   Bowel and bladder: denies issues Denies saddle paresthesias  WEIGHT BEARING RESTRICTIONS: No  FALLS:  Has patient fallen in last 6 months? Yes. Number of falls 1 time in May; she describes 2-3 over the past year  LIVING ENVIRONMENT: Lives with: lives alone Lives in: House/apartment Stairs: Yes: Internal: 12 to basement, 4-5 going into home;  steps; one rail Has following equipment at home: None  PLOF: Independent  PATIENT GOALS: reduce, walk upright, return to doing exercises  OBJECTIVE:  Note: Objective measures were completed at Evaluation unless otherwise noted.  DIAGNOSTIC FINDINGS:  IMPRESSION: 1. New mild compression deformity of L2, possibly  acute. 2. Stable L1 compression deformity. 3. Moderate multilevel degenerative change.   PATIENT SURVEYS:  Modified Oswestry: 54%  COGNITION: Overall cognitive status: Within functional limits for tasks assessed     SENSATION: WFL  POSTURE: No Significant postural limitations  PALPATION: None today-- due to   LUMBAR ROM: Deferred due to recent compression fx AROM eval  Flexion   Extension   Right lateral flexion   Left lateral flexion   Right rotation   Left rotation    (Blank rows = not tested)  LOWER EXTREMITY ROM:   WFLs  LOWER EXTREMITY MMT:  deferred due to pain-- able to do SLR  MMT Right eval Left eval  Hip flexion    Hip extension    Hip abduction    Hip adduction    Hip internal rotation    Hip external rotation    Knee flexion    Knee extension    Ankle dorsiflexion    Ankle plantarflexion    Ankle inversion    Ankle eversion     (Blank rows = not tested)  LUMBAR SPECIAL TESTS:  Deferred due to compression fx  FUNCTIONAL TESTS:  Log roll for bed mobility instructed and performed  GAIT: Distance walked: 20/13.16 seconds=1.52 ft/sec Assistive device utilized: None Level of assistance: Complete Independence Comments: slowed pace Pain after walking when she first sits down  [facility] Adult PT Treatment:                                                DATE: 11/15/23 Therapeutic Exercise: UBE x x 2.5 minutes forward 1 min back Seated Contract/relax cervical sidebending Shoulder circles Sidelying Scapular mobilization and shoulder rolls Supine Isometric contract/relax L sidebending Manual Therapy: STM scalenes L and R (L sore) Neuromuscular re-ed: Seated diagonal patterns with red band x 8 reps Rocker board standing with postural cues for hip positioning and pelvic tilt Therapeutic Activity: Sit<>stand x 5 reps  with UE support Sit<>stand no UE held x 3 reps Seated horizontal abduction with red band x 10 reps Gait: Gait x 180 ft nonstop with  cues for upright posture    [facility] Adult PT Treatment:                                                DATE: 11/10/23 Therapeutic Exercise: Cervical AROM: 58 deg R and 30 deg L rotation Cervical AROM:  28 deg R and 10 deg L  MMT=4/5 throughout Les Neuromuscular re-ed: Berg=44/56   Alternating toe touches to 6 step RLE and LLE without UE support Sidestepping with Ues abducted for core engagement Therapeutic Activity: Ambulated 5 minutes nonstop x 740 feet  Sit<>stand decreasing UE support Step ups to 4 step x 10 reps with bilat UE support RLE and LLE Standing heel raises off edge of step with bilat UE support Standing ankle DF x 10 reps with intermittent UE support 5 time sit<>stand=19.91 seconds with UE use Gait: 2.92 ft/sec gait speed   [facility] Adult PT Treatment:                                                DATE: 11/07/23 Therapeutic Exercise: Supine Chin tucks with towel roll  Chin tuck with neck rotation x 10 reps Horizontal abduction bilat for chest opening Standing W x 10 reps with cues on upright posture Ls x 10 reps with cues on posture Seated Shoulder rolls for mobilization Neck AROM flexion to neutral x 10 reps Therapeutic Activity: Dead bug for core engagement in abdominals, back, and neck x 10 reps Sit<>stand x 10 reps dec'ing UE support Standing overhead reaching x 10 reps 1# in each hand Towel slides up/down wall x 10 reps Step ups to 4 step x 10 reps R and L with bilat UE support Gait: Gait carrying 5# kettle bell in R hand x 80 feet, L hand x 80 feet   [facility] Adult PT Treatment:                                                DATE: 11/02/23 Therapeutic Exercise: Chin tucks pressing into a wall with ball for neck engagement x 5 reps Ls in standing with back to the wall x 5 reps Standing Rows with red band x 10 reps Shoulder extension with red band x 10 reps Neuromuscular re-ed: Standing in stride with alternating UE reaching Compliant surface  standing Eyes open + head turns Eyes closed with CGA to min A Lateral step ups/downs x 10 reps R and L Posterior alternating steps x 10 reps with UE support Therapeutic Activity: Standing overhead reaching with yoga block between knees and holding a ball overhead Gait: 6 minutes of walking nonstop with cues for arm swing, breath support,   PATIENT EDUCATION:  Education details: Updated HEP Person educated: Patient Education method: Explanation, Demonstration, and Handouts Education comprehension: verbalized understanding, returned demonstration, and needs further education  HOME EXERCISE PROGRAM: Access Code: [email or web address] URL: [email or web address] Date: 11/15/2023 Prepared by: Tawni Ferrier  Program Notes THESE SHOULD NOT INCREASE PAIN. If  so, wait until you return to therapy.  Exercises - Supine Active Straight Leg Raise  - 1 x daily - 5 x weekly - 1 sets - 10 reps - Heel Raises with Counter Support  - 1 x daily - 5 x weekly - 1 sets - 10 reps - Beginner Bridge  - 1 x daily - 7 x weekly - 3 sets - 10 reps - Sit to Stand Without Arm Support  - 2-3 x daily - 7 x weekly - 1 sets - 5 reps - Seated Cervical Flexion AROM  - 1 x daily - 7 x weekly - 1 sets - 10 reps - Supine Cervical Rotation AROM on Pillow  - 1 x daily - 7 x weekly - 1 sets - 10 reps - Seated Isometric Cervical Sidebending  - 2 x daily - 7 x weekly - 1 sets - 10 reps - Supine Dead Bug with Leg Extension  - 1 x daily - 7 x weekly - 3 sets - 10 reps   ASSESSMENT: CLINICAL IMPRESSION: The patient is continuing to progress to new LTGs. PT working on neck and balance/endurance activities. Plan to continue to work   EVAL: Patient is an 83  y.o. female who was seen today for physical therapy evaluation and treatment for compression fx and low back pain. Her pain varies from 5/10 up to 10/10 and is typically worse in the morning x first hour of waking, coughing, and after sitting and transitioning to stand.  Evaluation was limited due to high irritability and precautions due to fx. She presents with impairments in ROM, strength, pain, and overall functional mobility. PT recommends lumbar brace due to characteristics of pain during evaluation (winces with coughing and sit<>stand)-- will reach out to request order.  GOALS: Goals reviewed with patient? Yes  UPDATED LONG TERM GOALS:  LONG TERM GOALS: Target date: 12/09/23  The patient will be indep with HEP progression. Baseline:  performing current HEP Goal status: REVISED  2.  The patient will improve Berg balance scale to > or equal to 48/56. Baseline: 44/56 on 11/10/23 Goal status: NEW  3.  The patient will improve 5 time sit<>stand to < or equal to 16 seconds. Baseline: 19.91 seconds Goal status: NEW  4.  The patient will improve neck AROM L rotation to 45 degrees, and L SB to 20 degrees. Baseline:  see above Goal status: NEW  PLAN:  PT FREQUENCY: 2x/week  PT DURATION: 4 weeks  PLANNED INTERVENTIONS: 97164- PT Re-evaluation, 97750- Physical Performance Testing, 97110-Therapeutic exercises, 97530- Therapeutic activity, [id number]- Neuromuscular re-education, 97535- Self Care, [id number]- Manual therapy, [id number]- Gait training, [phone]- Aquatic Therapy, Patient/Family education, Balance training, Cryotherapy, and Moist heat.  PLAN FOR NEXT SESSION: More standing exercises to work on endurance, balance activities, promote lumbar curve awareness, LE strengthening to tolerance, progress to patient tolerance, gait training, return to functional tasks with modifications and education re: osteoporosis. Neck AROM and STM as needed.  Trunk lengthening and core engagement + breath support for functional tasks.***   Dotsie Gillette, PT 11/15/2023, 12:24 PM

## 2023-11-17 ENCOUNTER — Ambulatory Visit: Payer: Self-pay

## 2023-11-17 DIAGNOSIS — M5459 Other low back pain: Secondary | ICD-10-CM | POA: Diagnosis not present

## 2023-11-17 DIAGNOSIS — M6281 Muscle weakness (generalized): Secondary | ICD-10-CM

## 2023-11-17 NOTE — Therapy (Signed)
 OUTPATIENT PHYSICAL THERAPY THORACOLUMBAR TREATMENT   Patient Name: Yetzali Weld MRN: [id number] DOB:1940-09-04, 83 y.o., female Today's Date: 11/17/2023  END OF SESSION:  PT End of Session - 11/17/23 0934     Visit Number 18    Number of Visits 24    Date for Recertification  12/09/23    Authorization Type aetna medicare    Progress Note Due on Visit 20    PT Start Time 0935    PT Stop Time 1013    PT Time Calculation (min) 38 min    Activity Tolerance Patient tolerated treatment well    Behavior During Therapy WFL for tasks assessed/performed          Past Medical History:  Diagnosis Date   Atrial fibrillation (HCC)    Back pain    Hypertension    Past Surgical History:  Procedure Laterality Date   SKIN CANCER EXCISION  05/2022   on the chest   TONSILLECTOMY     TUBAL LIGATION     bilateral   Patient Active Problem List   Diagnosis Date Noted   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   Atrial fibrillation (HCC) 10/06/2022   Near syncope 09/03/2020   Pulmonary nodule, right 06/26/2019   MDD (major depressive disorder), recurrent episode (HCC) 12/26/2018   Emphysema lung (HCC) 03/28/2018   Episodic lightheadedness 08/08/2017   Essential hypertension 03/05/2016   Lumbar spondylosis 04/02/2015   Left knee pain 04/02/2015   Migraine 05/08/2013   Osteoporosis 11/10/2009   Vitamin D  deficiency 11/04/2009   Hyperlipidemia 11/04/2009   Backache 11/04/2009   FATIGUE 11/04/2009   Asymptomatic postmenopausal status 11/04/2009   PCP: Alvan Craven MD REFERRING PROVIDER: Alvan Craven MD REFERRING DIAG:  Diagnosis  S32.020A (ICD-10-CM) - Compression fracture of L2 vertebra, initial encounter (HCC)   Rationale for Evaluation and Treatment: Rehabilitation  THERAPY DIAG:  Other low back pain  Muscle weakness (generalized)  ONSET DATE: 09/13/23   SUBJECTIVE:                                                                                                                                                                                           SUBJEtCTIVE STATEMENT: Patient reports no change in Lt neck pain; states it is getting easier to walk straight.   EVAL: The patient reports that she had a fall on May 28 and hit her head and bumped her arm. 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 She is having significant pain that is constant in nature. She cannot get down on the  floor (she would get down to pray or exercise prior to this pain) and notes difficulty trying to get up.   PERTINENT HISTORY:  Osteoporosis, H/o back pain from early 90s. A-fib, emphysema, migraine, HTN.  PAIN:  Are you having pain? Yes: NPRS scale: neck : 2/10 ;  Pain location:  neck Pain description: soreness, gets to be sharp pain and muscle spasms Aggravating factors: getting up after sitting Relieving factors: heating pad,   PRECAUTIONS: Back *precautions due to acute compression fx and osteoporosis  RED FLAGS: Compression fracture: Yes: fall on May 28 with pain beginning 2 weeks post fall   Bowel and bladder: denies issues Denies saddle paresthesias  WEIGHT BEARING RESTRICTIONS: No  FALLS:  Has patient fallen in last 6 months? Yes. Number of falls 1 time in May; she describes 2-3 over the past year  LIVING ENVIRONMENT: Lives with: lives alone Lives in: House/apartment Stairs: Yes: Internal: 12 to basement, 4-5 going into home;  steps; one rail Has following equipment at home: None  PLOF: Independent  PATIENT GOALS: reduce, walk upright, return to doing exercises  OBJECTIVE:  Note: Objective measures were completed at Evaluation unless otherwise noted.  DIAGNOSTIC FINDINGS:  IMPRESSION: 1. New mild compression deformity of L2, possibly acute. 2. Stable L1 compression deformity. 3. Moderate multilevel degenerative change.   PATIENT SURVEYS:  Modified Oswestry:  54%  COGNITION: Overall cognitive status: Within functional limits for tasks assessed     SENSATION: WFL  POSTURE: No Significant postural limitations  PALPATION: None today-- due to   LUMBAR ROM: Deferred due to recent compression fx AROM eval  Flexion   Extension   Right lateral flexion   Left lateral flexion   Right rotation   Left rotation    (Blank rows = not tested)  LOWER EXTREMITY ROM:   WFLs  LOWER EXTREMITY MMT:  deferred due to pain-- able to do SLR  MMT Right eval Left eval  Hip flexion    Hip extension    Hip abduction    Hip adduction    Hip internal rotation    Hip external rotation    Knee flexion    Knee extension    Ankle dorsiflexion    Ankle plantarflexion    Ankle inversion    Ankle eversion     (Blank rows = not tested)  LUMBAR SPECIAL TESTS:  Deferred due to compression fx  FUNCTIONAL TESTS:  Log roll for bed mobility instructed and performed  GAIT: Distance walked: 20/13.16 seconds=1.52 ft/sec Assistive device utilized: None Level of assistance: Complete Independence Comments: slowed pace Pain after walking when she first sits down   [facility] Adult PT Treatment:                                                DATE: 11/17/2023 Therapeutic Exercise: Neuromuscular re-ed: Supine anterior pelvic tilts on deflated ball --> no ball Seated anterior pelvic tilts  Standing arm wall slides + verbal/tactile cues for anterior pelvic tilt & thoracic extension Supine on towel roll along spine: Scissor arms + 1#DB Shoulder flexion + 1# bar Therapeutic Activity: Shoulder shrugs Scap squeezes Bkwd shoulder circles UBE L2 x 2 min fwd/2 min bkwd Walking + postural cues --> anterior pelvic tilt & chest lift    [facility] Adult PT Treatment:  DATE: 11/15/23 Therapeutic Exercise: UBE x x 2.5 minutes forward 1 min back Seated Contract/relax cervical sidebending Shoulder circles Sidelying Scapular  mobilization and shoulder rolls Supine Isometric contract/relax L sidebending Manual Therapy: STM scalenes L and R (L sore) Neuromuscular re-ed: Seated diagonal patterns with red band x 8 reps Rocker board standing with postural cues for hip positioning and pelvic tilt Therapeutic Activity: Sit<>stand x 5 reps with UE support Sit<>stand no UE held x 3 reps Seated horizontal abduction with red band x 10 reps Gait: Gait x 180 ft nonstop with cues for upright posture    [facility] Adult PT Treatment:                                                DATE: 11/10/23 Therapeutic Exercise: Cervical AROM: 58 deg R and 30 deg L rotation Cervical AROM:  28 deg R and 10 deg L  MMT=4/5 throughout Les Neuromuscular re-ed: Berg=44/56   Alternating toe touches to 6 step RLE and LLE without UE support Sidestepping with Ues abducted for core engagement Therapeutic Activity: Ambulated 5 minutes nonstop x 740 feet  Sit<>stand decreasing UE support Step ups to 4 step x 10 reps with bilat UE support RLE and LLE Standing heel raises off edge of step with bilat UE support Standing ankle DF x 10 reps with intermittent UE support 5 time sit<>stand=19.91 seconds with UE use Gait: 2.92 ft/sec gait speed   [facility] Adult PT Treatment:                                                DATE: 11/07/23 Therapeutic Exercise: Supine Chin tucks with towel roll  Chin tuck with neck rotation x 10 reps Horizontal abduction bilat for chest opening Standing W x 10 reps with cues on upright posture Ls x 10 reps with cues on posture Seated Shoulder rolls for mobilization Neck AROM flexion to neutral x 10 reps Therapeutic Activity: Dead bug for core engagement in abdominals, back, and neck x 10 reps Sit<>stand x 10 reps dec'ing UE support Standing overhead reaching x 10 reps 1# in each hand Towel slides up/down wall x 10 reps Step ups to 4 step x 10 reps R and L with bilat UE support Gait: Gait carrying 5# kettle  bell in R hand x 80 feet, L hand x 80 feet   [facility] Adult PT Treatment:                                                DATE: 11/02/23 Therapeutic Exercise: Chin tucks pressing into a wall with ball for neck engagement x 5 reps Ls in standing with back to the wall x 5 reps Standing Rows with red band x 10 reps Shoulder extension with red band x 10 reps Neuromuscular re-ed: Standing in stride with alternating UE reaching Compliant surface standing Eyes open + head turns Eyes closed with CGA to min A Lateral step ups/downs x 10 reps R and L Posterior alternating steps x 10 reps with UE support Therapeutic Activity: Standing overhead reaching with yoga block between knees and holding  a ball overhead Gait: 6 minutes of walking nonstop with cues for arm swing, breath support,   PATIENT EDUCATION:  Education details: Updated HEP Person educated: Patient Education method: Explanation, Demonstration, and Handouts Education comprehension: verbalized understanding, returned demonstration, and needs further education  HOME EXERCISE PROGRAM: Access Code: [email or web address] URL: [email or web address] Date: 11/15/2023 Prepared by: Tawni Ferrier  Program Notes THESE SHOULD NOT INCREASE PAIN. If so, wait until you return to therapy.  Exercises - Supine Active Straight Leg Raise  - 1 x daily - 5 x weekly - 1 sets - 10 reps - Heel Raises with Counter Support  - 1 x daily - 5 x weekly - 1 sets - 10 reps - Beginner Bridge  - 1 x daily - 7 x weekly - 3 sets - 10 reps - Sit to Stand Without Arm Support  - 2-3 x daily - 7 x weekly - 1 sets - 5 reps - Seated Cervical Flexion AROM  - 1 x daily - 7 x weekly - 1 sets - 10 reps - Supine Cervical Rotation AROM on Pillow  - 1 x daily - 7 x weekly - 1 sets - 10 reps - Seated Isometric Cervical Sidebending  - 2 x daily - 7 x weekly - 1 sets - 10 reps - Supine Dead Bug with Leg Extension  - 1 x daily - 7 x weekly - 3 sets - 10  reps   ASSESSMENT: CLINICAL IMPRESSION: Session focused on progressing anterior pelvic tilt mechanics and postural awareness. Tactile cues provided to promote functional thoracic extension with wall arm slides, as well as encouraging anterior pelvic tilt. Good progress made with thoracolumbar extension and postural awareness; moderate postural fatigue noted with standing extension exercises.   EVAL: Patient is an 83  y.o. female who was seen today for physical therapy evaluation and treatment for compression fx and low back pain. Her pain varies from 5/10 up to 10/10 and is typically worse in the morning x first hour of waking, coughing, and after sitting and transitioning to stand. Evaluation was limited due to high irritability and precautions due to fx. She presents with impairments in ROM, strength, pain, and overall functional mobility. PT recommends lumbar brace due to characteristics of pain during evaluation (winces with coughing and sit<>stand)-- will reach out to request order.  GOALS: Goals reviewed with patient? Yes  UPDATED LONG TERM GOALS:  LONG TERM GOALS: Target date: 12/09/23  The patient will be indep with HEP progression. Baseline:  performing current HEP Goal status: REVISED  2.  The patient will improve Berg balance scale to > or equal to 48/56. Baseline: 44/56 on 11/10/23 Goal status: NEW  3.  The patient will improve 5 time sit<>stand to < or equal to 16 seconds. Baseline: 19.91 seconds Goal status: NEW  4.  The patient will improve neck AROM L rotation to 45 degrees, and L SB to 20 degrees. Baseline:  see above Goal status: NEW  PLAN:  PT FREQUENCY: 2x/week  PT DURATION: 4 weeks  PLANNED INTERVENTIONS: 97164- PT Re-evaluation, 97750- Physical Performance Testing, 97110-Therapeutic exercises, 97530- Therapeutic activity, [id number]- Neuromuscular re-education, 97535- Self Care, [id number]- Manual therapy, [id number]- Gait training, [phone]- Aquatic Therapy, Patient/Family  education, Balance training, Cryotherapy, and Moist heat.  PLAN FOR NEXT SESSION: More standing exercises to work on endurance, balance activities, promote lumbar curve awareness, LE strengthening to tolerance, progress to patient tolerance, gait training, return to functional tasks with modifications and education re: osteoporosis. Neck AROM and STM as needed.  Trunk lengthening and core engagement + breath support for functional tasks.   Lamarr GORMAN Price, PTA 11/17/2023, 10:12 AM

## 2023-11-20 NOTE — Therapy (Unsigned)
 OUTPATIENT PHYSICAL THERAPY THORACOLUMBAR TREATMENT   Patient Name: Christy Gutierrez MRN: [id number] DOB:[birth year], 83 y.o., female Today's Date: 11/21/2023  END OF SESSION:  PT End of Session - 11/21/23 1104     Visit Number 19    Number of Visits 24    Date for Recertification  12/09/23    Authorization Type aetna medicare    Progress Note Due on Visit 20    PT Start Time 1104    PT Stop Time 1145    PT Time Calculation (min) 41 min    Activity Tolerance Patient tolerated treatment well    Behavior During Therapy WFL for tasks assessed/performed          Past Medical History:  Diagnosis Date   Atrial fibrillation (HCC)    Back pain    Hypertension    Past Surgical History:  Procedure Laterality Date   SKIN CANCER EXCISION  05/2022   on the chest   TONSILLECTOMY     TUBAL LIGATION     bilateral   Patient Active Problem List   Diagnosis Date Noted   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   Atrial fibrillation (HCC) 10/06/2022   Near syncope 09/03/2020   Pulmonary nodule, right 06/26/2019   MDD (major depressive disorder), recurrent episode (HCC) 12/26/2018   Emphysema lung (HCC) 03/28/2018   Episodic lightheadedness 08/08/2017   Essential hypertension 03/05/2016   Lumbar spondylosis 04/02/2015   Left knee pain 04/02/2015   Migraine 05/08/2013   Osteoporosis 11/10/2009   Vitamin D  deficiency 11/04/2009   Hyperlipidemia 11/04/2009   Backache 11/04/2009   FATIGUE 11/04/2009   Asymptomatic postmenopausal status 11/04/2009   PCP: Alvan Craven MD REFERRING PROVIDER: Alvan Craven MD REFERRING DIAG:  Diagnosis  S32.020A (ICD-10-CM) - Compression fracture of L2 vertebra, initial encounter (HCC)   Rationale for Evaluation and Treatment: Rehabilitation  THERAPY DIAG:  Other low back pain  Muscle weakness (generalized)  ONSET DATE: 09/13/23   SUBJECTIVE:                                                                                                                                                                                           SUBJEtCTIVE STATEMENT: The patient reports that she felt her a-fib for the first time this past weekend. She felt a sense of shakiness and weakness. The L side of her neck is store sore. She had soreness after last PT session.   EVAL: The patient reports that she had a fall on May 28 and hit her head and bumped her arm. She did not notice immediate back pain, but notes pain began 2 weeks later when she mowed  the yard on a [employer]. She is having significant pain that is constant in nature. She cannot get down on the floor (she would get down to pray or exercise prior to this pain) and notes difficulty trying to get up.   PERTINENT HISTORY:  Osteoporosis, H/o back pain from early 90s. A-fib, emphysema, migraine, HTN.  PAIN:  Are you having pain? Yes: NPRS scale: neck : 4/10 ;  Pain location:  neck Pain description: soreness, gets to be sharp pain and muscle spasms Aggravating factors: getting up after sitting Relieving factors: heating pad,   PRECAUTIONS: Back *precautions due to acute compression fx and osteoporosis  RED FLAGS: Compression fracture: Yes: fall on May 28 with pain beginning 2 weeks post fall   Bowel and bladder: denies issues Denies saddle paresthesias  WEIGHT BEARING RESTRICTIONS: No  FALLS:  Has patient fallen in last 6 months? Yes. Number of falls 1 time in May; she describes 2-3 over the past year  LIVING ENVIRONMENT: Lives with: lives alone Lives in: House/apartment Stairs: Yes: Internal: 12 to basement, 4-5 going into home;  steps; one rail Has following equipment at home: None  PLOF: Independent  PATIENT GOALS: reduce, walk upright, return to doing exercises  OBJECTIVE:  Note: Objective measures were completed at Evaluation unless otherwise noted.  DIAGNOSTIC FINDINGS:  IMPRESSION: 1. New mild compression deformity of L2, possibly acute. 2. Stable L1 compression  deformity. 3. Moderate multilevel degenerative change.   PATIENT SURVEYS:  Modified Oswestry: 54%  COGNITION: Overall cognitive status: Within functional limits for tasks assessed     SENSATION: WFL  POSTURE: No Significant postural limitations  PALPATION: None today-- due to   LUMBAR ROM: Deferred due to recent compression fx AROM eval  Flexion   Extension   Right lateral flexion   Left lateral flexion   Right rotation   Left rotation    (Blank rows = not tested)  LOWER EXTREMITY ROM:   WFLs  LOWER EXTREMITY MMT:  deferred due to pain-- able to do SLR  MMT Right eval Left eval  Hip flexion    Hip extension    Hip abduction    Hip adduction    Hip internal rotation    Hip external rotation    Knee flexion    Knee extension    Ankle dorsiflexion    Ankle plantarflexion    Ankle inversion    Ankle eversion     (Blank rows = not tested)  LUMBAR SPECIAL TESTS:  Deferred due to compression fx  FUNCTIONAL TESTS:  Log roll for bed mobility instructed and performed  GAIT: Distance walked: 20/13.16 seconds=1.52 ft/sec Assistive device utilized: None Level of assistance: Complete Independence Comments: slowed pace Pain after walking when she first sits down   [facility] Adult PT Treatment:                                                DATE: 11/21/23 Therapeutic Exercise: Seated Neck AROM flexion and extension Neck AROM rotation Chin tucks pressing into therapists hands Manual Therapy: STM cervical spine focusing on suboccipitals, scalenes, and upper trap Gentle manual distraction  Neuromuscular re-ed: Seated pelvic tilts with improved demonstration of anterior pelvic tilt Wall bumps without Ues working on hip strategies and postural control Backwards walking with ball overhead to engage core upright Marchingin place holding a ball behind her  back to engage upright posture and pelvic tilt Therapeutic Activity: Neck isometrics L lateral bending x 5 seconds x  5 reps Seated rows x 10 reps with red band Sit<>stand x 5 reps without UE support   [facility] Adult PT Treatment:                                                DATE: 11/17/2023 Therapeutic Exercise: Neuromuscular re-ed: Supine anterior pelvic tilts on deflated ball --> no ball Seated anterior pelvic tilts  Standing arm wall slides + verbal/tactile cues for anterior pelvic tilt & thoracic extension Supine on towel roll along spine: Scissor arms + 1#DB Shoulder flexion + 1# bar Therapeutic Activity: Shoulder shrugs Scap squeezes Bkwd shoulder circles UBE L2 x 2 min fwd/2 min bkwd Walking + postural cues --> anterior pelvic tilt & chest lift    [facility] Adult PT Treatment:                                                DATE: 11/15/23 Therapeutic Exercise: UBE x x 2.5 minutes forward 1 min back Seated Contract/relax cervical sidebending Shoulder circles Sidelying Scapular mobilization and shoulder rolls Supine Isometric contract/relax L sidebending Manual Therapy: STM scalenes L and R (L sore) Neuromuscular re-ed: Seated diagonal patterns with red band x 8 reps Rocker board standing with postural cues for hip positioning and pelvic tilt Therapeutic Activity: Sit<>stand x 5 reps with UE support Sit<>stand no UE held x 3 reps Seated horizontal abduction with red band x 10 reps Gait: Gait x 180 ft nonstop with cues for upright posture   [facility] Adult PT Treatment:                                                DATE: 11/10/23 Therapeutic Exercise: Cervical AROM: 58 deg R and 30 deg L rotation Cervical AROM:  28 deg R and 10 deg L  MMT=4/5 throughout Les Neuromuscular re-ed: Berg=44/56  Alternating toe touches to 6 step RLE and LLE without UE support Sidestepping with Ues abducted for core engagement Therapeutic Activity: Ambulated 5 minutes nonstop x 740 feet  Sit<>stand decreasing UE support Step ups to 4 step x 10 reps with bilat UE support RLE and LLE Standing heel raises  off edge of step with bilat UE support Standing ankle DF x 10 reps with intermittent UE support 5 time sit<>stand=19.91 seconds with UE use Gait: 2.92 ft/sec gait speed  PATIENT EDUCATION:  Education details: Updated HEP Person educated: Patient Education method: Explanation, Demonstration, and Handouts Education comprehension: verbalized understanding, returned demonstration, and needs further education  HOME EXERCISE PROGRAM: Access Code: [email or web address] URL: [email or web address] Date: 11/15/2023 Prepared by: Tawni Ferrier  Program Notes THESE SHOULD NOT INCREASE PAIN. If so, wait until you return to therapy.  Exercises - Supine Active Straight Leg Raise  - 1 x daily - 5 x weekly - 1 sets - 10 reps - Heel Raises with Counter Support  - 1 x daily - 5 x weekly - 1 sets - 10 reps - Beginner Bridge  - 1 x daily - 7 x  weekly - 3 sets - 10 reps - Sit to Stand Without Arm Support  - 2-3 x daily - 7 x weekly - 1 sets - 5 reps - Seated Cervical Flexion AROM  - 1 x daily - 7 x weekly - 1 sets - 10 reps - Supine Cervical Rotation AROM on Pillow  - 1 x daily - 7 x weekly - 1 sets - 10 reps - Seated Isometric Cervical Sidebending  - 2 x daily - 7 x weekly - 1 sets - 10 reps - Supine Dead Bug with Leg Extension  - 1 x daily - 7 x weekly - 3 sets - 10 reps  ASSESSMENT: CLINICAL IMPRESSION: Patient reports pain is increasing/remaining in scalene and behind L ear. She notices less pain in L upper trap. Patient also reports today that she felt the a-fib for the first time over the weekend and wonders if this is contributing to her difficulty with longer walks. PT recommended reaching out to cardiologist if fatigue continues. PT to continue focusing on reducing L side neck pain and improving balance/endurance for functional mobility.  Patient able to demo carryover of last session's improved pelvic tilt-- easier in sitting than standing.  EVAL: Patient is an 83  y.o. female who was  seen today for physical therapy evaluation and treatment for compression fx and low back pain. Her pain varies from 5/10 up to 10/10 and is typically worse in the morning x first hour of waking, coughing, and after sitting and transitioning to stand. Evaluation was limited due to high irritability and precautions due to fx. She presents with impairments in ROM, strength, pain, and overall functional mobility. PT recommends lumbar brace due to characteristics of pain during evaluation (winces with coughing and sit<>stand)-- will reach out to request order.  GOALS: Goals reviewed with patient? Yes  UPDATED LONG TERM GOALS:  LONG TERM GOALS: Target date: 12/09/23  The patient will be indep with HEP progression. Baseline:  performing current HEP Goal status: REVISED  2.  The patient will improve Berg balance scale to > or equal to 48/56. Baseline: 44/56 on 11/10/23 Goal status: NEW  3.  The patient will improve 5 time sit<>stand to < or equal to 16 seconds. Baseline: 19.91 seconds Goal status: NEW  4.  The patient will improve neck AROM L rotation to 45 degrees, and L SB to 20 degrees. Baseline:  see above Goal status: NEW  PLAN:  PT FREQUENCY: 2x/week  PT DURATION: 4 weeks  PLANNED INTERVENTIONS: 97164- PT Re-evaluation, 97750- Physical Performance Testing, 97110-Therapeutic exercises, 97530- Therapeutic activity, [id number]- Neuromuscular re-education, 97535- Self Care, [id number]- Manual therapy, [id number]- Gait training, [phone]- Aquatic Therapy, Patient/Family education, Balance training, Cryotherapy, and Moist heat.  PLAN FOR NEXT SESSION: More standing exercises to work on endurance, balance activities, promote lumbar curve awareness, LE strengthening to tolerance, progress to patient tolerance, gait training, return to functional tasks with modifications and education re: osteoporosis. Neck AROM and STM as needed.  Trunk lengthening and core engagement + breath support for functional  tasks.   Samanthia Howland, PT 11/21/2023, 11:04 AM

## 2023-11-21 ENCOUNTER — Encounter: Payer: Self-pay | Admitting: Rehabilitative and Restorative Service Providers"

## 2023-11-21 ENCOUNTER — Ambulatory Visit: Payer: Self-pay | Admitting: Rehabilitative and Restorative Service Providers"

## 2023-11-21 DIAGNOSIS — M5459 Other low back pain: Secondary | ICD-10-CM | POA: Diagnosis not present

## 2023-11-21 DIAGNOSIS — M6281 Muscle weakness (generalized): Secondary | ICD-10-CM

## 2023-11-23 ENCOUNTER — Ambulatory Visit: Payer: Self-pay

## 2023-11-23 DIAGNOSIS — M6281 Muscle weakness (generalized): Secondary | ICD-10-CM

## 2023-11-23 DIAGNOSIS — M5459 Other low back pain: Secondary | ICD-10-CM

## 2023-11-23 NOTE — Therapy (Signed)
 OUTPATIENT PHYSICAL THERAPY THORACOLUMBAR TREATMENT  Progress Note Reporting Period 10/17/2023 to 11/23/2023  See note below for Objective Data and Assessment of Progress/Goals.      Patient Name: Marcha Licklider MRN: [id number] DOB:[birth year], 83 y.o., female Today's Date: 11/23/2023  END OF SESSION:  PT End of Session - 11/23/23 1450     Visit Number 20    Number of Visits 24    Date for Recertification  12/09/23    Authorization Type aetna medicare    Progress Note Due on Visit 20    PT Start Time 1450    PT Stop Time 1530    PT Time Calculation (min) 40 min    Activity Tolerance Patient tolerated treatment well    Behavior During Therapy WFL for tasks assessed/performed         Past Medical History:  Diagnosis Date   Atrial fibrillation (HCC)    Back pain    Hypertension    Past Surgical History:  Procedure Laterality Date   SKIN CANCER EXCISION  05/2022   on the chest   TONSILLECTOMY     TUBAL LIGATION     bilateral   Patient Active Problem List   Diagnosis Date Noted   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   Atrial fibrillation (HCC) 10/06/2022   Near syncope 09/03/2020   Pulmonary nodule, right 06/26/2019   MDD (major depressive disorder), recurrent episode 12/26/2018   Emphysema lung (HCC) 03/28/2018   Episodic lightheadedness 08/08/2017   Essential hypertension 03/05/2016   Lumbar spondylosis 04/02/2015   Left knee pain 04/02/2015   Migraine 05/08/2013   Osteoporosis 11/10/2009   Vitamin D  deficiency 11/04/2009   Hyperlipidemia 11/04/2009   Backache 11/04/2009   FATIGUE 11/04/2009   Asymptomatic postmenopausal status 11/04/2009   PCP: Alvan Craven MD REFERRING PROVIDER: Alvan Craven MD REFERRING DIAG:  Diagnosis  S32.020A (ICD-10-CM) - Compression fracture of L2 vertebra, initial encounter (HCC)   Rationale for Evaluation and Treatment: Rehabilitation  THERAPY DIAG:  Other low back pain  Muscle weakness (generalized)  ONSET DATE:  09/13/23   SUBJECTIVE:                                                                                                                                                                                          SUBJEtCTIVE STATEMENT: Patient reports no change in neck pain in Lt, states she was worn out after last visit. Patient states she feels tightness around ribs when walking longer distance.   EVAL: The patient reports that she had a fall on May 28 and hit her head and bumped her arm. 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 She is having significant pain that is constant in nature. She cannot get down on the floor (she would get down to pray or exercise prior to this pain) and notes difficulty trying to get up.   PERTINENT HISTORY:  Osteoporosis, H/o back pain from early 90s. A-fib, emphysema, migraine, HTN.  PAIN:  Are you having pain? Yes: NPRS scale: neck : 4/10 ;  Pain location:  neck Pain description: soreness, gets to be sharp pain and muscle spasms Aggravating factors: getting up after sitting Relieving factors: heating pad,   PRECAUTIONS: Back *precautions due to acute compression fx and osteoporosis  RED FLAGS: Compression fracture: Yes: fall on May 28 with pain beginning 2 weeks post fall   Bowel and bladder: denies issues Denies saddle paresthesias  WEIGHT BEARING RESTRICTIONS: No  FALLS:  Has patient fallen in last 6 months? Yes. Number of falls 1 time in May; she describes 2-3 over the past year  LIVING ENVIRONMENT: Lives with: lives alone Lives in: House/apartment Stairs: Yes: Internal: 12 to basement, 4-5 going into home;  steps; one rail Has following equipment at home: None  PLOF: Independent  PATIENT GOALS: reduce, walk upright, return to doing exercises  OBJECTIVE:  Note: Objective measures were completed at Evaluation unless otherwise noted.  DIAGNOSTIC FINDINGS:   IMPRESSION: 1. New mild compression deformity of L2, possibly acute. 2. Stable L1 compression deformity. 3. Moderate multilevel degenerative change.   PATIENT SURVEYS:  Modified Oswestry: 54%  COGNITION: Overall cognitive status: Within functional limits for tasks assessed     SENSATION: WFL  POSTURE: No Significant postural limitations  PALPATION: None today-- due to   LUMBAR ROM: Deferred due to recent compression fx AROM eval  Flexion   Extension   Right lateral flexion   Left lateral flexion   Right rotation   Left rotation    (Blank rows = not tested)  LOWER EXTREMITY ROM:   WFLs  LOWER EXTREMITY MMT:  deferred due to pain-- able to do SLR  MMT Right eval Left eval  Hip flexion    Hip extension    Hip abduction    Hip adduction    Hip internal rotation    Hip external rotation    Knee flexion    Knee extension    Ankle dorsiflexion    Ankle plantarflexion    Ankle inversion    Ankle eversion     (Blank rows = not tested)  LUMBAR SPECIAL TESTS:  Deferred due to compression fx  FUNCTIONAL TESTS:  Log roll for bed mobility instructed and performed  GAIT: Distance walked: 20/13.16 seconds=1.52 ft/sec Assistive device utilized: None Level of assistance: Complete Independence Comments: slowed pace Pain after walking when she first sits down   [facility] Adult PT Treatment:                                                DATE: 11/23/2023 Neuromuscular re-ed: Straw breathing --> inhale through nose, exhale through straw Added elbow circles for ribcage elevation & diaphragm decompression Therapeutic Activity: Seated:  Elevated ribcage with crossed arm lift overhead Side bend stretch with arms crossed overhead  Standing: Elevated ribcage with crossed arm lift overhead    [facility] Adult PT Treatment:  DATE: 11/21/23 Therapeutic Exercise: Seated Neck AROM flexion and extension Neck AROM rotation Chin tucks  pressing into therapists hands Manual Therapy: STM cervical spine focusing on suboccipitals, scalenes, and upper trap Gentle manual distraction  Neuromuscular re-ed: Seated pelvic tilts with improved demonstration of anterior pelvic tilt Wall bumps without Ues working on hip strategies and postural control Backwards walking with ball overhead to engage core upright Marchingin place holding a ball behind her back to engage upright posture and pelvic tilt Therapeutic Activity: Neck isometrics L lateral bending x 5 seconds x 5 reps Seated rows x 10 reps with red band Sit<>stand x 5 reps without UE support   [facility] Adult PT Treatment:                                                DATE: 11/17/2023 Therapeutic Exercise: Neuromuscular re-ed: Supine anterior pelvic tilts on deflated ball --> no ball Seated anterior pelvic tilts  Standing arm wall slides + verbal/tactile cues for anterior pelvic tilt & thoracic extension Supine on towel roll along spine: Scissor arms + 1#DB Shoulder flexion + 1# bar Therapeutic Activity: Shoulder shrugs Scap squeezes Bkwd shoulder circles UBE L2 x 2 min fwd/2 min bkwd Walking + postural cues --> anterior pelvic tilt & chest lift     PATIENT EDUCATION:  Education details: Updated HEP Person educated: Patient Education method: Programmer, multimedia, Facilities manager, and Handouts Education comprehension: verbalized understanding, returned demonstration, and needs further education  HOME EXERCISE PROGRAM: Access Code: [email or web address] URL: [email or web address] Date: 11/15/2023 Prepared by: Tawni Ferrier  Program Notes THESE SHOULD NOT INCREASE PAIN. If so, wait until you return to therapy.  Exercises - Supine Active Straight Leg Raise  - 1 x daily - 5 x weekly - 1 sets - 10 reps - Heel Raises with Counter Support  - 1 x daily - 5 x weekly - 1 sets - 10 reps - Beginner Bridge  - 1 x daily - 7 x weekly - 3 sets - 10 reps - Sit to Stand Without Arm  Support  - 2-3 x daily - 7 x weekly - 1 sets - 5 reps - Seated Cervical Flexion AROM  - 1 x daily - 7 x weekly - 1 sets - 10 reps - Supine Cervical Rotation AROM on Pillow  - 1 x daily - 7 x weekly - 1 sets - 10 reps - Seated Isometric Cervical Sidebending  - 2 x daily - 7 x weekly - 1 sets - 10 reps - Supine Dead Bug with Leg Extension  - 1 x daily - 7 x weekly - 3 sets - 10 reps  ASSESSMENT: CLINICAL IMPRESSION: Patient continues to have pain when turning head to the Lt that extends from behind Lt ear down into top of shoulder. Continued to incorporate diaphragmatic breathing and elevating ribcage to improve posture and decompress diaphragm. Exercises progressed from sitting to standing to challenge dynamic balance and stability. Good progress noted with ribcage mobility and breathing mechanics to decompress diaphragm. Straw breathing incorporated to progress mechanics with inhalation through nose and lengthening exhale through mouth; mechanics and relaxation improved with repetition of activity. Patient will continue to benefit from skilled therapy to improve balance, postural alignment, and address pain deficits.    EVAL: Patient is an 83  y.o. female who was seen today for physical therapy evaluation and treatment for compression fx  and low back pain. Her pain varies from 5/10 up to 10/10 and is typically worse in the morning x first hour of waking, coughing, and after sitting and transitioning to stand. Evaluation was limited due to high irritability and precautions due to fx. She presents with impairments in ROM, strength, pain, and overall functional mobility. PT recommends lumbar brace due to characteristics of pain during evaluation (winces with coughing and sit<>stand)-- will reach out to request order.  GOALS: Goals reviewed with patient? Yes  UPDATED LONG TERM GOALS:  LONG TERM GOALS: Target date: 12/09/23  The patient will be indep with HEP progression. Baseline:  performing current  HEP Goal status: REVISED  2.  The patient will improve Berg balance scale to > or equal to 48/56. Baseline: 44/56 on 11/10/23 Goal status: NEW  3.  The patient will improve 5 time sit<>stand to < or equal to 16 seconds. Baseline: 19.91 seconds Goal status: NEW  4.  The patient will improve neck AROM L rotation to 45 degrees, and L SB to 20 degrees. Baseline:  see above Goal status: NEW  PLAN:  PT FREQUENCY: 2x/week  PT DURATION: 4 weeks  PLANNED INTERVENTIONS: 97164- PT Re-evaluation, 97750- Physical Performance Testing, 97110-Therapeutic exercises, 97530- Therapeutic activity, [id number]- Neuromuscular re-education, 97535- Self Care, 02859- Manual therapy, [id number]- Gait training, [phone]- Aquatic Therapy, Patient/Family education, Balance training, Cryotherapy, and Moist heat.  PLAN FOR NEXT SESSION: More standing exercises to work on endurance, balance activities, promote lumbar curve awareness, LE strengthening to tolerance, progress to patient tolerance, gait training, return to functional tasks with modifications and education re: osteoporosis. Neck AROM and STM as needed. Trunk lengthening and core engagement + breath support for functional tasks.   Lamarr GORMAN Price, PTA 11/23/2023, 3:38 PM

## 2023-11-28 ENCOUNTER — Encounter: Payer: Self-pay | Admitting: Rehabilitative and Restorative Service Providers"

## 2023-11-28 ENCOUNTER — Ambulatory Visit: Payer: Self-pay | Admitting: Rehabilitative and Restorative Service Providers"

## 2023-11-28 DIAGNOSIS — M5459 Other low back pain: Secondary | ICD-10-CM

## 2023-11-28 DIAGNOSIS — M6281 Muscle weakness (generalized): Secondary | ICD-10-CM

## 2023-11-28 NOTE — Therapy (Signed)
 OUTPATIENT PHYSICAL THERAPY THORACOLUMBAR TREATMENT     Patient Name: Christy Gutierrez MRN: [id number] DOB:12-22-1940, 83 y.o., female Today's Date: 11/28/2023  END OF SESSION:  PT End of Session - 11/28/23 1109     Visit Number 21    Number of Visits 24    Date for Recertification  12/09/23    Authorization Type aetna medicare    Progress Note Due on Visit 30    PT Start Time 1108    PT Stop Time 1148    PT Time Calculation (min) 40 min    Activity Tolerance Patient tolerated treatment well    Behavior During Therapy WFL for tasks assessed/performed         Past Medical History:  Diagnosis Date   Atrial fibrillation (HCC)    Back pain    Hypertension    Past Surgical History:  Procedure Laterality Date   SKIN CANCER EXCISION  05/2022   on the chest   TONSILLECTOMY     TUBAL LIGATION     bilateral   Patient Active Problem List   Diagnosis Date Noted   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   Atrial fibrillation (HCC) 10/06/2022   Near syncope 09/03/2020   Pulmonary nodule, right 06/26/2019   MDD (major depressive disorder), recurrent episode 12/26/2018   Emphysema lung (HCC) 03/28/2018   Episodic lightheadedness 08/08/2017   Essential hypertension 03/05/2016   Lumbar spondylosis 04/02/2015   Left knee pain 04/02/2015   Migraine 05/08/2013   Osteoporosis 11/10/2009   Vitamin D  deficiency 11/04/2009   Hyperlipidemia 11/04/2009   Backache 11/04/2009   FATIGUE 11/04/2009   Asymptomatic postmenopausal status 11/04/2009   PCP: Alvan Craven MD REFERRING PROVIDER: Alvan Craven MD REFERRING DIAG:  Diagnosis  S32.020A (ICD-10-CM) - Compression fracture of L2 vertebra, initial encounter (HCC)   Rationale for Evaluation and Treatment: Rehabilitation  THERAPY DIAG:  Other low back pain  Muscle weakness (generalized)  ONSET DATE: 09/13/23   SUBJECTIVE:                                                                                                                                                                                           SUBJEtCTIVE STATEMENT: Patient reports her neck is still bothering her. It does reduce intensity at times. She feels a pulling sensation in her core when walking longer distances.    EVAL: The patient reports that she had a fall on May 28 and hit her head and bumped her arm. 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 She is having significant pain that  is constant in nature. She cannot get down on the floor (she would get down to pray or exercise prior to this pain) and notes difficulty trying to get up.   PERTINENT HISTORY:  Osteoporosis, H/o back pain from early 90s. A-fib, emphysema, migraine, HTN.  PAIN:  Are you having pain? Yes: NPRS scale: neck : 3/10   Pain location:  neck Pain description: soreness, gets to be sharp pain and muscle spasms Aggravating factors: getting up after sitting Relieving factors: heating pad,   PRECAUTIONS: Back *precautions due to acute compression fx and osteoporosis  RED FLAGS: Compression fracture: Yes: fall on May 28 with pain beginning 2 weeks post fall   Bowel and bladder: denies issues Denies saddle paresthesias  WEIGHT BEARING RESTRICTIONS: No  FALLS:  Has patient fallen in last 6 months? Yes. Number of falls 1 time in May; she describes 2-3 over the past year  LIVING ENVIRONMENT: Lives with: lives alone Lives in: House/apartment Stairs: Yes: Internal: 12 to basement, 4-5 going into home;  steps; one rail Has following equipment at home: None  PLOF: Independent  PATIENT GOALS: reduce, walk upright, return to doing exercises  OBJECTIVE:  Note: Objective measures were completed at Evaluation unless otherwise noted.  DIAGNOSTIC FINDINGS:  IMPRESSION: 1. New mild compression deformity of L2, possibly acute. 2. Stable L1 compression deformity. 3. Moderate multilevel degenerative  change.   PATIENT SURVEYS:  Modified Oswestry: 54%  COGNITION: Overall cognitive status: Within functional limits for tasks assessed     SENSATION: WFL  POSTURE: No Significant postural limitations  PALPATION: None today-- due to   LUMBAR ROM: Deferred due to recent compression fx  LOWER EXTREMITY ROM:   WFLs  LOWER EXTREMITY MMT:  deferred due to pain-- able to do SLR  MMT Right eval Left eval  Hip flexion    Hip extension    Hip abduction    Hip adduction    Hip internal rotation    Hip external rotation    Knee flexion    Knee extension    Ankle dorsiflexion    Ankle plantarflexion    Ankle inversion    Ankle eversion     (Blank rows = not tested)  LUMBAR SPECIAL TESTS:  Deferred due to compression fx  FUNCTIONAL TESTS:  Log roll for bed mobility instructed and performed  GAIT: Distance walked: 20/13.16 seconds=1.52 ft/sec Assistive device utilized: None Level of assistance: Complete Independence Comments: slowed pace Pain after walking when she first sits down  [facility] Adult PT Treatment:                                                DATE: 11/28/23 Therapeutic Exercise: Seated Chin tucks x 3 reps-- increases upper cervical discomfort Reapeated after STM in supine and can tolerate 5 reps Added scap retraction with red band + chin tucks x 5 reps Supine Chin tuck x 5 reps into pillow Then again after STM with increased ROM and dec'd pain Manual Therapy: STM suboccipitals, scalenes, cervical multifidi Gentle PROM with overpressure to tolerance into cervical flexion, flexion + rotation, gentle passive extension Neuromuscular re-ed: Coordinating breath support with head rotation R and L in sitting Sidestepping x 10 reps R and L Sidestepping with arms abducted R and L x 10 reps Therapeutic Activity: Supine head lifts off pillow (1  raise from pillow) x 5 reps  x 2 sets working on strength for functional tasks through neck Neck isometrics L cervical spine  x 5 reps x 5 seconds Horizontal abduction red band x 10 reps with breath support coordinating with movement Seated diagonal movements with scapular retraction red band while emphasizing breathing Wall leans on elbow with scapular protraction and retraction Holding 1# anteriorly in R and L hands and trying to reach forward (serve the platter)-- patient moves into kyphosis and has pain that wraps around ribs 1# weight in R hand x 5 reps reaching anteriorly and then on L side x 5 reps Gait: Gait x 160 feet working on arm swing and equal stride length   [facility] Adult PT Treatment:                                                DATE: 11/23/2023 Neuromuscular re-ed: Straw breathing --> inhale through nose, exhale through straw Added elbow circles for ribcage elevation & diaphragm decompression Therapeutic Activity: Seated:  Elevated ribcage with crossed arm lift overhead Side bend stretch with arms crossed overhead  Standing: Elevated ribcage with crossed arm lift overhead    [facility] Adult PT Treatment:                                                DATE: 11/21/23 Therapeutic Exercise: Seated Neck AROM flexion and extension Neck AROM rotation Chin tucks pressing into therapists hands Manual Therapy: STM cervical spine focusing on suboccipitals, scalenes, and upper trap Gentle manual distraction  Neuromuscular re-ed: Seated pelvic tilts with improved demonstration of anterior pelvic tilt Wall bumps without Ues working on hip strategies and postural control Backwards walking with ball overhead to engage core upright Marchingin place holding a ball behind her back to engage upright posture and pelvic tilt Therapeutic Activity: Neck isometrics L lateral bending x 5 seconds x 5 reps Seated rows x 10 reps with red band Sit<>stand x 5 reps without UE support   [facility] Adult PT Treatment:                                                DATE: 11/17/2023 Therapeutic Exercise: Neuromuscular  re-ed: Supine anterior pelvic tilts on deflated ball --> no ball Seated anterior pelvic tilts  Standing arm wall slides + verbal/tactile cues for anterior pelvic tilt & thoracic extension Supine on towel roll along spine: Scissor arms + 1#DB Shoulder flexion + 1# bar Therapeutic Activity: Shoulder shrugs Scap squeezes Bkwd shoulder circles UBE L2 x 2 min fwd/2 min bkwd Walking + postural cues --> anterior pelvic tilt & chest lift     PATIENT EDUCATION:  Education details: Updated HEP Person educated: Patient Education method: Programmer, multimedia, Facilities manager, and Handouts Education comprehension: verbalized understanding, returned demonstration, and needs further education  HOME EXERCISE PROGRAM: Access Code: [email or web address] URL: [email or web address] Date: 11/15/2023 Prepared by: Tawni Ferrier  Program Notes THESE SHOULD NOT INCREASE PAIN. If so, wait until you return to therapy.  Exercises - Supine Active Straight Leg Raise  - 1 x daily - 5 x weekly - 1 sets - 10 reps - Heel Raises with  Counter Support  - 1 x daily - 5 x weekly - 1 sets - 10 reps - Beginner Bridge  - 1 x daily - 7 x weekly - 3 sets - 10 reps - Sit to Stand Without Arm Support  - 2-3 x daily - 7 x weekly - 1 sets - 5 reps - Seated Cervical Flexion AROM  - 1 x daily - 7 x weekly - 1 sets - 10 reps - Supine Cervical Rotation AROM on Pillow  - 1 x daily - 7 x weekly - 1 sets - 10 reps - Seated Isometric Cervical Sidebending  - 2 x daily - 7 x weekly - 1 sets - 10 reps - Supine Dead Bug with Leg Extension  - 1 x daily - 7 x weekly - 3 sets - 10 reps  ASSESSMENT: CLINICAL IMPRESSION: The patient is continuing with neck pain worse on the L side. It improves within session, but stiffness returns. PT continuing to work on postural strengthening, neck ROM, balance and endurance. The patient notes reaching forward is hard for her and replicates rib pain. With serve the platter motion, she gets a band of pain  around the ribs. PT anticipates checking goals and potentially doing one more renewal session.  EVAL: Patient is an 83  y.o. female who was seen today for physical therapy evaluation and treatment for compression fx and low back pain. Her pain varies from 5/10 up to 10/10 and is typically worse in the morning x first hour of waking, coughing, and after sitting and transitioning to stand. Evaluation was limited due to high irritability and precautions due to fx. She presents with impairments in ROM, strength, pain, and overall functional mobility. PT recommends lumbar brace due to characteristics of pain during evaluation (winces with coughing and sit<>stand)-- will reach out to request order.  GOALS: Goals reviewed with patient? Yes  UPDATED LONG TERM GOALS:  LONG TERM GOALS: Target date: 12/09/23  The patient will be indep with HEP progression. Baseline:  performing current HEP Goal status: REVISED  2.  The patient will improve Berg balance scale to > or equal to 48/56. Baseline: 44/56 on 11/10/23 Goal status: NEW  3.  The patient will improve 5 time sit<>stand to < or equal to 16 seconds. Baseline: 19.91 seconds Goal status: NEW  4.  The patient will improve neck AROM L rotation to 45 degrees, and L SB to 20 degrees. Baseline:  see above Goal status: NEW  PLAN:  PT FREQUENCY: 2x/week  PT DURATION: 4 weeks  PLANNED INTERVENTIONS: 97164- PT Re-evaluation, 97750- Physical Performance Testing, 97110-Therapeutic exercises, 97530- Therapeutic activity, [id number]- Neuromuscular re-education, 97535- Self Care, [id number]- Manual therapy, [id number]- Gait training, [phone]- Aquatic Therapy, Patient/Family education, Balance training, Cryotherapy, and Moist heat.  PLAN FOR NEXT SESSION: More standing exercises to work on endurance, balance activities, promote lumbar curve awareness, LE strengthening to tolerance, progress to patient tolerance, gait training, return to functional tasks with modifications  and education re: osteoporosis. Neck AROM and STM as needed. Trunk lengthening and core engagement + breath support for functional tasks. Reaching is continuing to be challenging-- work on with cues for positioning.   Ngoc Daughtridge, PT 11/28/2023, 11:09 AM

## 2023-11-30 ENCOUNTER — Ambulatory Visit: Payer: Self-pay | Attending: Family Medicine

## 2023-11-30 DIAGNOSIS — M542 Cervicalgia: Secondary | ICD-10-CM | POA: Insufficient documentation

## 2023-11-30 DIAGNOSIS — M5459 Other low back pain: Secondary | ICD-10-CM | POA: Diagnosis present

## 2023-11-30 DIAGNOSIS — M6281 Muscle weakness (generalized): Secondary | ICD-10-CM | POA: Diagnosis present

## 2023-11-30 DIAGNOSIS — R293 Abnormal posture: Secondary | ICD-10-CM | POA: Insufficient documentation

## 2023-11-30 NOTE — Therapy (Signed)
 OUTPATIENT PHYSICAL THERAPY THORACOLUMBAR TREATMENT     Patient Name: Christy Gutierrez MRN: [id number] DOB:10-28-1940, 83 y.o., female Today's Date: 11/30/2023  END OF SESSION:  PT End of Session - 11/30/23 1057     Visit Number 22    Number of Visits 24    Date for Recertification  12/09/23    Authorization Type aetna medicare    Progress Note Due on Visit 30    PT Start Time 1058    PT Stop Time 1151    PT Time Calculation (min) 53 min    Activity Tolerance Patient tolerated treatment well    Behavior During Therapy WFL for tasks assessed/performed         Past Medical History:  Diagnosis Date   Atrial fibrillation (HCC)    Back pain    Hypertension    Past Surgical History:  Procedure Laterality Date   SKIN CANCER EXCISION  05/2022   on the chest   TONSILLECTOMY     TUBAL LIGATION     bilateral   Patient Active Problem List   Diagnosis Date Noted   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   Atrial fibrillation (HCC) 10/06/2022   Near syncope 09/03/2020   Pulmonary nodule, right 06/26/2019   MDD (major depressive disorder), recurrent episode 12/26/2018   Emphysema lung (HCC) 03/28/2018   Episodic lightheadedness 08/08/2017   Essential hypertension 03/05/2016   Lumbar spondylosis 04/02/2015   Left knee pain 04/02/2015   Migraine 05/08/2013   Osteoporosis 11/10/2009   Vitamin D  deficiency 11/04/2009   Hyperlipidemia 11/04/2009   Backache 11/04/2009   FATIGUE 11/04/2009   Asymptomatic postmenopausal status 11/04/2009   PCP: Alvan Craven MD REFERRING PROVIDER: Alvan Craven MD REFERRING DIAG:  Diagnosis  S32.020A (ICD-10-CM) - Compression fracture of L2 vertebra, initial encounter (HCC)   Rationale for Evaluation and Treatment: Rehabilitation  THERAPY DIAG:  Other low back pain  Muscle weakness (generalized)  ONSET DATE: 09/13/23   SUBJECTIVE:                                                                                                                                                                                           SUBJEtCTIVE STATEMENT: Patient reports her neck hurts all the time, states she felt the work at last PT session was very helpful. Patient states when she tries to stand or sit up straight she continues to feel tightness around lower ribcage and that I'm being pulled forward.   EVAL: The patient reports that she had a fall on May 28 and hit her head and bumped her arm. 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 She is having significant pain that is constant in nature. She cannot get down on the floor (she would get down to pray or exercise prior to this pain) and notes difficulty trying to get up.   PERTINENT HISTORY:  Osteoporosis, H/o back pain from early 90s. A-fib, emphysema, migraine, HTN.  PAIN:  Are you having pain? Yes: NPRS scale: neck : 3/10   Pain location:  neck Pain description: soreness, gets to be sharp pain and muscle spasms Aggravating factors: getting up after sitting Relieving factors: heating pad,   PRECAUTIONS: Back *precautions due to acute compression fx and osteoporosis  RED FLAGS: Compression fracture: Yes: fall on May 28 with pain beginning 2 weeks post fall   Bowel and bladder: denies issues Denies saddle paresthesias  WEIGHT BEARING RESTRICTIONS: No  FALLS:  Has patient fallen in last 6 months? Yes. Number of falls 1 time in May; she describes 2-3 over the past year  LIVING ENVIRONMENT: Lives with: lives alone Lives in: House/apartment Stairs: Yes: Internal: 12 to basement, 4-5 going into home;  steps; one rail Has following equipment at home: None  PLOF: Independent  PATIENT GOALS: reduce, walk upright, return to doing exercises  OBJECTIVE:  Note: Objective measures were completed at Evaluation unless otherwise noted.  DIAGNOSTIC FINDINGS:  IMPRESSION: 1. New mild compression deformity of  L2, possibly acute. 2. Stable L1 compression deformity. 3. Moderate multilevel degenerative change.   PATIENT SURVEYS:  Modified Oswestry: 54%  COGNITION: Overall cognitive status: Within functional limits for tasks assessed     SENSATION: WFL  POSTURE: No Significant postural limitations  PALPATION: None today-- due to   LUMBAR ROM: Deferred due to recent compression fx  LOWER EXTREMITY ROM:   WFLs  LOWER EXTREMITY MMT:  deferred due to pain-- able to do SLR  MMT Right eval Left eval  Hip flexion    Hip extension    Hip abduction    Hip adduction    Hip internal rotation    Hip external rotation    Knee flexion    Knee extension    Ankle dorsiflexion    Ankle plantarflexion    Ankle inversion    Ankle eversion     (Blank rows = not tested)  LUMBAR SPECIAL TESTS:  Deferred due to compression fx  FUNCTIONAL TESTS:  Log roll for bed mobility instructed and performed  GAIT: Distance walked: 20/13.16 seconds=1.52 ft/sec Assistive device utilized: None Level of assistance: Complete Independence Comments: slowed pace Pain after walking when she first sits down   [facility] Adult PT Treatment:                                                DATE: 11/30/2023 Manual Therapy: STM cervical paraspinals, upper trap, scalenes Neuromuscular re-ed: Supine chin tucks into pillow 2x5 Standing 1#dowel reach forward (serve the platter) --> very difficult to maintain upright posture Supine on towel roll (vertical along spine): W arm open/close Scissor arms + 1#DB Therapeutic Activity: Hooklying cervical head turns + breathing cues Supine shoulder flexion + dowel with breathing cues Seated shoulder flexion with dowel + breathing cues Standing shoulder flexion with dowel + breathing cues --> very difficult Supine chest press + 3#dowel + breathing cues Upright standing posture + holding yoga block behind back --> green bolster  Walking + holding green bolster behind back -->  2#dowel behind back Walking + 5#DB suitcase carry     [facility] Adult PT Treatment:                                                DATE: 11/28/23 Therapeutic Exercise: Seated Chin tucks x 3 reps-- increases upper cervical discomfort Reapeated after STM in supine and can tolerate 5 reps Added scap retraction with red band + chin tucks x 5 reps Supine Chin tuck x 5 reps into pillow Then again after STM with increased ROM and dec'd pain Manual Therapy: STM suboccipitals, scalenes, cervical multifidi Gentle PROM with overpressure to tolerance into cervical flexion, flexion + rotation, gentle passive extension Neuromuscular re-ed: Coordinating breath support with head rotation R and L in sitting Sidestepping x 10 reps R and L Sidestepping with arms abducted R and L x 10 reps Therapeutic Activity: Supine head lifts off pillow (1  raise from pillow) x 5 reps x 2 sets working on strength for functional tasks through neck Neck isometrics L cervical spine x 5 reps x 5 seconds Horizontal abduction red band x 10 reps with breath support coordinating with movement Seated diagonal movements with scapular retraction red band while emphasizing breathing Wall leans on elbow with scapular protraction and retraction Holding 1# anteriorly in R and L hands and trying to reach forward (serve the platter)-- patient moves into kyphosis and has pain that wraps around ribs 1# weight in R hand x 5 reps reaching anteriorly and then on L side x 5 reps Gait: Gait x 160 feet working on arm swing and equal stride length    [facility] Adult PT Treatment:                                                DATE: 11/23/2023 Neuromuscular re-ed: Straw breathing --> inhale through nose, exhale through straw Added elbow circles for ribcage elevation & diaphragm decompression Therapeutic Activity: Seated:  Elevated ribcage with crossed arm lift overhead Side bend stretch with arms crossed overhead  Standing: Elevated ribcage  with crossed arm lift overhead    PATIENT EDUCATION:  Education details: Updated HEP Person educated: Patient Education method: Explanation, Demonstration, and Handouts Education comprehension: verbalized understanding, returned demonstration, and needs further education  HOME EXERCISE PROGRAM: Access Code: [email or web address] URL: [email or web address] Date: 11/30/2023 Prepared by: Lamarr Price  Program Notes THESE SHOULD NOT INCREASE PAIN. If so, wait until you return to therapy.Straw breathing: Inhale through nose, exhale through straw  Exercises - Supine Active Straight Leg Raise  - 1 x daily - 5 x weekly - 1 sets - 10 reps - Heel Raises with Counter Support  - 1 x daily - 5 x weekly - 1 sets - 10 reps - Beginner Bridge  - 1 x daily - 7 x weekly - 3 sets - 10 reps - Sit to Stand Without Arm Support  - 2-3 x daily - 7 x weekly - 1 sets - 5 reps - Seated Cervical Flexion AROM  - 1 x daily - 7 x weekly - 1 sets - 10 reps - Supine Cervical Rotation AROM on Pillow  - 1 x daily - 7 x weekly - 1 sets -  10 reps - Seated Isometric Cervical Sidebending  - 2 x daily - 7 x weekly - 1 sets - 10 reps - Supine Dead Bug with Leg Extension  - 1 x daily - 7 x weekly - 3 sets - 10 reps - Standing Pursed Lip Breathing with Ribcage Elevation  - 2-3 x daily - 7 x weekly - 1 sets - 10 reps - Seated Thoracic Lumbar Extension with Pectoralis Stretch  - 1-2 x daily - 7 x weekly - 1 sets - 10 reps - 5-10 sec hold - Supine Shoulder Flexion Extension AAROM with Dowel  - 1 x daily - 7 x weekly - 1-2 sets - 10 reps - Seated Shoulder Flexion AAROM with Dowel  - 1 x daily - 7 x weekly - 1-2 sets - 5 reps - Standing Shoulder Flexion AAROM with Dowel  - 1 x daily - 7 x weekly - 1-2 sets - 5 reps   ASSESSMENT: CLINICAL IMPRESSION: Continued working on shoulder flexion progression from supine to standing to challenge maintaining upright posture; patient most challenged with transition from seated to standing  lifting due to posterior chain flexion compensation. Postural stability challenged with suitcase carry when walking. Supine chin tucks alleviated cervical discomfort at end of session.   EVAL: Patient is an 83  y.o. female who was seen today for physical therapy evaluation and treatment for compression fx and low back pain. Her pain varies from 5/10 up to 10/10 and is typically worse in the morning x first hour of waking, coughing, and after sitting and transitioning to stand. Evaluation was limited due to high irritability and precautions due to fx. She presents with impairments in ROM, strength, pain, and overall functional mobility. PT recommends lumbar brace due to characteristics of pain during evaluation (winces with coughing and sit<>stand)-- will reach out to request order.  GOALS: Goals reviewed with patient? Yes  UPDATED LONG TERM GOALS:  LONG TERM GOALS: Target date: 12/09/23  The patient will be indep with HEP progression. Baseline:  performing current HEP Goal status: REVISED  2.  The patient will improve Berg balance scale to > or equal to 48/56. Baseline: 44/56 on 11/10/23 Goal status: NEW  3.  The patient will improve 5 time sit<>stand to < or equal to 16 seconds. Baseline: 19.91 seconds Goal status: NEW  4.  The patient will improve neck AROM L rotation to 45 degrees, and L SB to 20 degrees. Baseline:  see above Goal status: NEW  PLAN:  PT FREQUENCY: 2x/week  PT DURATION: 4 weeks  PLANNED INTERVENTIONS: 97164- PT Re-evaluation, 97750- Physical Performance Testing, 97110-Therapeutic exercises, 97530- Therapeutic activity, [id number]- Neuromuscular re-education, 97535- Self Care, 02859- Manual therapy, [id number]- Gait training, [phone]- Aquatic Therapy, Patient/Family education, Balance training, Cryotherapy, and Moist heat.  PLAN FOR NEXT SESSION: More standing exercises to work on endurance, balance activities, promote lumbar curve awareness, LE strengthening to tolerance,  progress to patient tolerance, gait training, return to functional tasks with modifications and education re: osteoporosis. Neck AROM and STM as needed. Trunk lengthening and core engagement + breath support for functional tasks. Reaching is continuing to be challenging-- work on with cues for positioning.   Lamarr GORMAN Price, PTA 11/30/2023, 11:52 AM

## 2023-12-05 ENCOUNTER — Encounter: Payer: Self-pay | Admitting: Rehabilitative and Restorative Service Providers"

## 2023-12-05 ENCOUNTER — Ambulatory Visit: Admitting: Rehabilitative and Restorative Service Providers"

## 2023-12-05 DIAGNOSIS — M5459 Other low back pain: Secondary | ICD-10-CM

## 2023-12-05 DIAGNOSIS — M6281 Muscle weakness (generalized): Secondary | ICD-10-CM

## 2023-12-05 NOTE — Therapy (Signed)
 OUTPATIENT PHYSICAL THERAPY THORACOLUMBAR TREATMENT     Patient Name: Christy Gutierrez MRN: [id number] DOB:16-Oct-1940, 83 y.o., female Today's Date: 12/05/2023  END OF SESSION:  PT End of Session - 12/05/23 1101     Visit Number 23    Number of Visits 24    Date for Recertification  12/09/23    Authorization Type [employer] medicare    Authorization - Visit Number 23    Progress Note Due on Visit 30    PT Start Time 1101    PT Stop Time 1144    PT Time Calculation (min) 43 min    Activity Tolerance Patient tolerated treatment well    Behavior During Therapy WFL for tasks assessed/performed          Past Medical History:  Diagnosis Date   Atrial fibrillation (HCC)    Back pain    Hypertension    Past Surgical History:  Procedure Laterality Date   SKIN CANCER EXCISION  05/2022   on the chest   TONSILLECTOMY     TUBAL LIGATION     bilateral   Patient Active Problem List   Diagnosis Date Noted   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   Atrial fibrillation (HCC) 10/06/2022   Near syncope 09/03/2020   Pulmonary nodule, right 06/26/2019   MDD (major depressive disorder), recurrent episode 12/26/2018   Emphysema lung (HCC) 03/28/2018   Episodic lightheadedness 08/08/2017   Essential hypertension 03/05/2016   Lumbar spondylosis 04/02/2015   Left knee pain 04/02/2015   Migraine 05/08/2013   Osteoporosis 11/10/2009   Vitamin D  deficiency 11/04/2009   Hyperlipidemia 11/04/2009   Backache 11/04/2009   FATIGUE 11/04/2009   Asymptomatic postmenopausal status 11/04/2009   PCP: Alvan Craven MD REFERRING PROVIDER: Alvan Craven MD REFERRING DIAG:  Diagnosis  S32.020A (ICD-10-CM) - Compression fracture of L2 vertebra, initial encounter (HCC)   Rationale for Evaluation and Treatment: Rehabilitation  THERAPY DIAG:  Other low back pain  Muscle weakness (generalized)  ONSET DATE: 09/13/23   SUBJECTIVE:                                                                                                                                                                                           SUBJEtCTIVE STATEMENT: The patient reports her low back and balance are improving. She wakes every day with neck stiffness. She does exercises each morning, but struggles with gaining her ROM in cervical spine-- she rates daily neck pain is a 4-5/10. She feels recent overhead exercises are flaring pain b/c it was a 3/10.  She gets thoracic pain with long distance walking and reaching anteriorly with both hands. She is taking  2 excedrin/day due to neck pain and feels this helps.  EVAL: The patient reports that she had a fall on May 28 and hit her head and bumped her arm. 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 She is having significant pain that is constant in nature. She cannot get down on the floor (she would get down to pray or exercise prior to this pain) and notes difficulty trying to get up.   PERTINENT HISTORY:  Osteoporosis, H/o back pain from early 90s. A-fib, emphysema, migraine, HTN.  PAIN:  Are you having pain? Yes: NPRS scale: neck : 5/10   Pain location:  neck Pain description: soreness, gets to be sharp pain and muscle spasms Aggravating factors: getting up after sitting Relieving factors: heating pad,   PRECAUTIONS: Back *precautions due to acute compression fx and osteoporosis  RED FLAGS: Compression fracture: Yes: fall on May 28 with pain beginning 2 weeks post fall   Bowel and bladder: denies issues Denies saddle paresthesias  WEIGHT BEARING RESTRICTIONS: No  FALLS:  Has patient fallen in last 6 months? Yes. Number of falls 1 time in May; she describes 2-3 over the past year  LIVING ENVIRONMENT: Lives with: lives alone Lives in: House/apartment Stairs: Yes: Internal: 12 to basement, 4-5 going into home;  steps; one rail Has following equipment at home: None  PLOF:  Independent  PATIENT GOALS: reduce, walk upright, return to doing exercises  OBJECTIVE:  Note: Objective measures were completed at Evaluation unless otherwise noted.  DIAGNOSTIC FINDINGS:  IMPRESSION: 1. New mild compression deformity of L2, possibly acute. 2. Stable L1 compression deformity. 3. Moderate multilevel degenerative change.   PATIENT SURVEYS:  Modified Oswestry: 54%  COGNITION: Overall cognitive status: Within functional limits for tasks assessed     SENSATION: WFL  POSTURE: No Significant postural limitations  PALPATION: None today-- due to   LUMBAR ROM: Deferred due to recent compression fx  LOWER EXTREMITY ROM:   WFLs  LOWER EXTREMITY MMT:  deferred due to pain-- able to do SLR  MMT Right eval Left eval  Hip flexion    Hip extension    Hip abduction    Hip adduction    Hip internal rotation    Hip external rotation    Knee flexion    Knee extension    Ankle dorsiflexion    Ankle plantarflexion    Ankle inversion    Ankle eversion     (Blank rows = not tested)  LUMBAR SPECIAL TESTS:  Deferred due to compression fx  FUNCTIONAL TESTS:  Log roll for bed mobility instructed and performed  GAIT: Distance walked: 20/13.16 seconds=1.52 ft/sec Assistive device utilized: None Level of assistance: Complete Independence Comments: slowed pace Pain after walking when she first sits down  11/10/23 CERVICAL ROM:   Active ROM A/PROM (deg) 11/10/23 AROM 12/05/23  Flexion    Extension    Right lateral flexion 28 22  Left lateral flexion 10 18  Right rotation 58  40  Left rotation 30 40   (Blank rows = not tested)  Neuromuscular re-ed: Berg=44/56  [facility] Adult PT Treatment:                                                DATE: 12/05/23 Therapeutic Exercise: Supine  Gentle neck AROM Gentle neck PROM with overpressure within tolerance Isometrics L rotation x 5 reps with 3-5 second holds-- this held ROM, but patient continues with L cervical  musculature pain NECK AROM: see chart Supine horizontal abduction red band-- x 12 reps - notes mild pain L side with this movement Manual Therapy: STM L scalenes, L upper trap Sidelying scapular mobilization L UE  Neuromuscular re-ed: Supine chin tucks into pillow and then into P.T.s hands  Backwards walking along countertop engaging core Marching in place and then moving forward emphasizing upright core/trunk positioning Alternating foot taps to 6 step Physical Performance/test measurement:  [facility] PT Assessment - 12/05/23 1128       Standardized Balance Assessment   Standardized Balance Assessment Berg Balance Test      Berg Balance Test   Sit to Stand Able to stand without using hands and stabilize independently    Standing Unsupported Able to stand safely 2 minutes    Sitting with Back Unsupported but Feet Supported on Floor or Stool Able to sit safely and securely 2 minutes    Stand to Sit Sits safely with minimal use of hands    Transfers Able to transfer safely, minor use of hands    Standing Unsupported with Eyes Closed Able to stand 10 seconds safely    Standing Unsupported with Feet Together Able to place feet together independently and stand 1 minute safely    From Standing, Reach Forward with Outstretched Arm Can reach forward >12 cm safely (5)    From Standing Position, Pick up Object from Floor Able to pick up shoe safely and easily    From Standing Position, Turn to Look Behind Over each Shoulder Turn sideways only but maintains balance    Turn 360 Degrees Able to turn 360 degrees safely in 4 seconds or less    Standing Unsupported, Alternately Place Feet on Step/Stool Able to complete 4 steps without aid or supervision    Standing Unsupported, One Foot in Front Able to take small step independently and hold 30 seconds    Standing on One Leg Tries to lift leg/unable to hold 3 seconds but remains standing independently    Total Score 46    Berg comment: 46/56            [facility] Adult PT Treatment:                                                DATE: 11/30/2023 Manual Therapy: STM cervical paraspinals, upper trap, scalenes Neuromuscular re-ed: Supine chin tucks into pillow 2x5 Standing 1#dowel reach forward (serve the platter) --> very difficult to maintain upright posture Supine on towel roll (vertical along spine): W arm open/close Scissor arms + 1#DB Therapeutic Activity: Hooklying cervical head turns + breathing cues Supine shoulder flexion + dowel with breathing cues Seated shoulder flexion with dowel + breathing cues Standing shoulder flexion with dowel + breathing cues --> very difficult Supine chest press + 3#dowel + breathing cues Upright standing posture + holding yoga block behind back --> green bolster Walking + holding green bolster behind back --> 2#dowel behind back Walking + 5#DB suitcase carry   PATIENT EDUCATION:  Education details: Updated HEP Person educated: Patient Education method: Programmer, multimedia, Facilities manager, and Handouts Education comprehension: verbalized understanding, returned demonstration, and needs further education  HOME EXERCISE PROGRAM: Access Code: [email or web address] URL:  [email or web address] Date: 11/30/2023 Prepared by: Lamarr Price  Program Notes THESE SHOULD NOT INCREASE PAIN. If so, wait until you return to therapy.Straw breathing: Inhale through nose, exhale through straw  Exercises - Supine Active Straight Leg Raise  - 1 x daily - 5 x weekly - 1 sets - 10 reps - Heel Raises with Counter Support  - 1 x daily - 5 x weekly - 1 sets - 10 reps - Beginner Bridge  - 1 x daily - 7 x weekly - 3 sets - 10 reps - Sit to Stand Without Arm Support  - 2-3 x daily - 7 x weekly - 1 sets - 5 reps - Seated Cervical Flexion AROM  - 1 x daily - 7 x weekly - 1 sets - 10 reps - Supine Cervical Rotation AROM on Pillow  - 1 x daily - 7 x weekly - 1 sets - 10 reps - Seated Isometric Cervical Sidebending  - 2 x daily  - 7 x weekly - 1 sets - 10 reps - Supine Dead Bug with Leg Extension  - 1 x daily - 7 x weekly - 3 sets - 10 reps - Standing Pursed Lip Breathing with Ribcage Elevation  - 2-3 x daily - 7 x weekly - 1 sets - 10 reps - Seated Thoracic Lumbar Extension with Pectoralis Stretch  - 1-2 x daily - 7 x weekly - 1 sets - 10 reps - 5-10 sec hold - Supine Shoulder Flexion Extension AAROM with Dowel  - 1 x daily - 7 x weekly - 1-2 sets - 10 reps - Seated Shoulder Flexion AAROM with Dowel  - 1 x daily - 7 x weekly - 1-2 sets - 5 reps - Standing Shoulder Flexion AAROM with Dowel  - 1 x daily - 7 x weekly - 1-2 sets - 5 reps   ASSESSMENT: CLINICAL IMPRESSION: The patient notes neck pain reduces significantly with therapy to a 2/10. She is continuing to experience a band of thoracic pain when doing functional activities of reaching anteriorly. Her Berg balance scale improved 2 points with tandem standing, single limb standing, and trunk rotation being limited. PT plans to renew next week (on Monday) x 6 more weeks. Due to ongoing neck pain, PT to route note to Dr. Dean and determine if patient should schedule another f/u visit to discuss other options for interventions.  Patient to stop doing overhead dowel exercises due to her feeling this has increased neck pain this week.   EVAL: Patient is an 83  y.o. female who was seen today for physical therapy evaluation and treatment for compression fx and low back pain. Her pain varies from 5/10 up to 10/10 and is typically worse in the morning x first hour of waking, coughing, and after sitting and transitioning to stand. Evaluation was limited due to high irritability and precautions due to fx. She presents with impairments in ROM, strength, pain, and overall functional mobility. PT recommends lumbar brace due to characteristics of pain during evaluation (winces with coughing and sit<>stand)-- will reach out to request order.  GOALS: Goals reviewed with patient?  Yes  UPDATED LONG TERM GOALS:  LONG TERM GOALS: Target date: 12/09/23  The patient will be indep with HEP progression. Baseline:  performing current HEP Goal status: REVISED  2.  The patient will improve Berg balance scale to > or equal to 48/56. Baseline: 44/56 on 11/10/23 Goal status: NEW  3.  The patient will improve 5 time sit<>stand to < or  equal to 16 seconds. Baseline: 19.91 seconds Goal status: NEW  4.  The patient will improve neck AROM L rotation to 45 degrees, and L SB to 20 degrees. Baseline:  see above Goal status: PARTIALLY MET  PLAN:  PT FREQUENCY: 2x/week  PT DURATION: 4 weeks  PLANNED INTERVENTIONS: [id number]- PT Re-evaluation, 97750- Physical Performance Testing, 97110-Therapeutic exercises, 97530- Therapeutic activity, [id number]- Neuromuscular re-education, 97535- Self Care, [id number]- Manual therapy, [id number]- Gait training, [phone]- Aquatic Therapy, Patient/Family education, Balance training, Cryotherapy, and Moist heat.  PLAN FOR NEXT SESSION: More standing exercises to work on endurance, balance activities, promote lumbar curve awareness, LE strengthening to tolerance, progress to patient tolerance, gait training, return to functional tasks with modifications and education re: osteoporosis. Neck AROM and STM as needed. Trunk lengthening and core engagement + breath support for functional tasks. Plan to renew next week x 6 more weeks.  Bradd Merlos, PT 12/05/2023, 11:01 AM

## 2023-12-07 ENCOUNTER — Ambulatory Visit

## 2023-12-07 DIAGNOSIS — M5459 Other low back pain: Secondary | ICD-10-CM | POA: Diagnosis not present

## 2023-12-07 DIAGNOSIS — M6281 Muscle weakness (generalized): Secondary | ICD-10-CM

## 2023-12-07 NOTE — Therapy (Signed)
 OUTPATIENT PHYSICAL THERAPY THORACOLUMBAR TREATMENT     Patient Name: Christy Gutierrez MRN: [id number] DOB:05-26-1940, 83 y.o., female Today's Date: 12/07/2023  END OF SESSION:  PT End of Session - 12/07/23 1102     Visit Number 24    Number of Visits 24    Date for Recertification  12/09/23    Authorization Type [employer] medicare    Authorization - Visit Number 24    PT Start Time 1105    PT Stop Time 1153    PT Time Calculation (min) 48 min    Activity Tolerance Patient tolerated treatment well    Behavior During Therapy WFL for tasks assessed/performed          Past Medical History:  Diagnosis Date   Atrial fibrillation (HCC)    Back pain    Hypertension    Past Surgical History:  Procedure Laterality Date   SKIN CANCER EXCISION  05/2022   on the chest   TONSILLECTOMY     TUBAL LIGATION     bilateral   Patient Active Problem List   Diagnosis Date Noted   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   Atrial fibrillation (HCC) 10/06/2022   Near syncope 09/03/2020   Pulmonary nodule, right 06/26/2019   MDD (major depressive disorder), recurrent episode 12/26/2018   Emphysema lung (HCC) 03/28/2018   Episodic lightheadedness 08/08/2017   Essential hypertension 03/05/2016   Lumbar spondylosis 04/02/2015   Left knee pain 04/02/2015   Migraine 05/08/2013   Osteoporosis 11/10/2009   Vitamin D  deficiency 11/04/2009   Hyperlipidemia 11/04/2009   Backache 11/04/2009   FATIGUE 11/04/2009   Asymptomatic postmenopausal status 11/04/2009   PCP: Alvan Craven MD REFERRING PROVIDER: Alvan Craven MD REFERRING DIAG:  Diagnosis  S32.020A (ICD-10-CM) - Compression fracture of L2 vertebra, initial encounter (HCC)   Rationale for Evaluation and Treatment: Rehabilitation  THERAPY DIAG:  Other low back pain  Muscle weakness (generalized)  ONSET DATE: 09/13/23   SUBJECTIVE:                                                                                                                                                                                           SUBJEtCTIVE STATEMENT: Patient reports when she wakes she has pain that wraps around the neck; states as day goes on her pain gets better but it doesn't go away. Patient states the pain around her neck is 4/10 in the mornings and drops down to 2/10 by the end of the day; reports 3/10 pain in neck currently.  EVAL: The patient reports that she had a fall on May 28 and hit her head and bumped her arm. 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 She is having significant pain that is constant in nature. She cannot get down on the floor (she would get down to pray or exercise prior to this pain) and notes difficulty trying to get up.   PERTINENT HISTORY:  Osteoporosis, H/o back pain from early 90s. A-fib, emphysema, migraine, HTN.  PAIN:  Are you having pain? Yes: NPRS scale: neck: 3/10   Pain location:  neck Pain description: soreness, gets to be sharp pain and muscle spasms Aggravating factors: getting up after sitting Relieving factors: heating pad,   PRECAUTIONS: Back *precautions due to acute compression fx and osteoporosis  RED FLAGS: Compression fracture: Yes: fall on May 28 with pain beginning 2 weeks post fall   Bowel and bladder: denies issues Denies saddle paresthesias  WEIGHT BEARING RESTRICTIONS: No  FALLS:  Has patient fallen in last 6 months? Yes. Number of falls 1 time in May; she describes 2-3 over the past year  LIVING ENVIRONMENT: Lives with: lives alone Lives in: House/apartment Stairs: Yes: Internal: 12 to basement, 4-5 going into home;  steps; one rail Has following equipment at home: None  PLOF: Independent  PATIENT GOALS: reduce, walk upright, return to doing exercises  OBJECTIVE:  Note: Objective measures were completed at Evaluation unless otherwise noted.  DIAGNOSTIC FINDINGS:  IMPRESSION: 1. New  mild compression deformity of L2, possibly acute. 2. Stable L1 compression deformity. 3. Moderate multilevel degenerative change.   PATIENT SURVEYS:  Modified Oswestry: 54%  COGNITION: Overall cognitive status: Within functional limits for tasks assessed     SENSATION: WFL  POSTURE: No Significant postural limitations  PALPATION: None today-- due to   LUMBAR ROM: Deferred due to recent compression fx  LOWER EXTREMITY ROM:   WFLs  LOWER EXTREMITY MMT:  deferred due to pain-- able to do SLR  MMT Right eval Left eval  Hip flexion    Hip extension    Hip abduction    Hip adduction    Hip internal rotation    Hip external rotation    Knee flexion    Knee extension    Ankle dorsiflexion    Ankle plantarflexion    Ankle inversion    Ankle eversion     (Blank rows = not tested)  LUMBAR SPECIAL TESTS:  Deferred due to compression fx  FUNCTIONAL TESTS:  Log roll for bed mobility instructed and performed  GAIT: Distance walked: 20/13.16 seconds=1.52 ft/sec Assistive device utilized: None Level of assistance: Complete Independence Comments: slowed pace Pain after walking when she first sits down  11/10/23 CERVICAL ROM:   Active ROM A/PROM (deg) 11/10/23 AROM 12/05/23  Flexion    Extension    Right lateral flexion 28 22  Left lateral flexion 10 18  Right rotation 58  40  Left rotation 30 40   (Blank rows = not tested)  Neuromuscular re-ed: Berg=44/56   [facility] Adult PT Treatment:                                                DATE: 12/07/2023 Manual Therapy: Gentle cervical traction/passive elongation Rib mobility (Lt>Rt) --> underneath border of ribs; over pressure at ribs on exhale to promote lateral mobility on inhale Neuromuscular re-ed: Supine: Cervical rotation isometric (Lt) with assist from  therapist (supine) Chin tucks AROM Chin tuck + gentle cervical elongation PROM Anterior pelvic tilt in hooklying with towel roll at lumbar spine LTR in anterior  pelvic tilt with towel roll Standing cat (lumbar extension) with towel roll behind low back at wall Therapeutic Activity: Supine: Gentle neck AROM --> head turns/nods Shoulder horizontal abduction + red TB Bridges x10 Walking with postural cues   [facility] Adult PT Treatment:                                                DATE: 12/05/23 Therapeutic Exercise: Supine Gentle neck AROM Gentle neck PROM with overpressure within tolerance Isometrics L rotation x 5 reps with 3-5 second holds-- this held ROM, but patient continues with L cervical musculature pain NECK AROM: see chart Supine horizontal abduction red band-- x 12 reps - notes mild pain L side with this movement Manual Therapy: STM L scalenes, L upper trap Sidelying scapular mobilization L UE  Neuromuscular re-ed: Supine chin tucks into pillow and then into P.T.s hands  Backwards walking along countertop engaging core Marching in place and then moving forward emphasizing upright core/trunk positioning Alternating foot taps to 6 step Physical Performance/test measurement:     [facility] Adult PT Treatment:                                                DATE: 11/30/2023 Manual Therapy: STM cervical paraspinals, upper trap, scalenes Neuromuscular re-ed: Supine chin tucks into pillow 2x5 Standing 1#dowel reach forward (serve the platter) --> very difficult to maintain upright posture Supine on towel roll (vertical along spine): W arm open/close Scissor arms + 1#DB Therapeutic Activity: Hooklying cervical head turns + breathing cues Supine shoulder flexion + dowel with breathing cues Seated shoulder flexion with dowel + breathing cues Standing shoulder flexion with dowel + breathing cues --> very difficult Supine chest press + 3#dowel + breathing cues Upright standing posture + holding yoga block behind back --> green bolster Walking + holding green bolster behind back --> 2#dowel behind back Walking + 5#DB suitcase carry     PATIENT EDUCATION:  Education details: Updated HEP Person educated: Patient Education method: Programmer, multimedia, Facilities manager, and Handouts Education comprehension: verbalized understanding, returned demonstration, and needs further education  HOME EXERCISE PROGRAM: Access Code: [email or web address] URL: [email or web address] Date: 11/30/2023 Prepared by: Lamarr Price  Program Notes THESE SHOULD NOT INCREASE PAIN. If so, wait until you return to therapy.Straw breathing: Inhale through nose, exhale through straw  Exercises - Supine Active Straight Leg Raise  - 1 x daily - 5 x weekly - 1 sets - 10 reps - Heel Raises with Counter Support  - 1 x daily - 5 x weekly - 1 sets - 10 reps - Beginner Bridge  - 1 x daily - 7 x weekly - 3 sets - 10 reps - Sit to Stand Without Arm Support  - 2-3 x daily - 7 x weekly - 1 sets - 5 reps - Seated Cervical Flexion AROM  - 1 x daily - 7 x weekly - 1 sets - 10 reps - Supine Cervical Rotation AROM on Pillow  - 1 x daily - 7 x weekly - 1 sets - 10 reps - Seated Isometric Cervical Sidebending  -  2 x daily - 7 x weekly - 1 sets - 10 reps - Supine Dead Bug with Leg Extension  - 1 x daily - 7 x weekly - 3 sets - 10 reps - Standing Pursed Lip Breathing with Ribcage Elevation  - 2-3 x daily - 7 x weekly - 1 sets - 10 reps - Seated Thoracic Lumbar Extension with Pectoralis Stretch  - 1-2 x daily - 7 x weekly - 1 sets - 10 reps - 5-10 sec hold - Supine Shoulder Flexion Extension AAROM with Dowel  - 1 x daily - 7 x weekly - 1-2 sets - 10 reps - Seated Shoulder Flexion AAROM with Dowel  - 1 x daily - 7 x weekly - 1-2 sets - 5 reps - Standing Shoulder Flexion AAROM with Dowel  - 1 x daily - 7 x weekly - 1-2 sets - 5 reps   ASSESSMENT: CLINICAL IMPRESSION: Decreased rib mobility noted on Lt side during diaphragmatic breathing in supine; tactile cues and manual overpressure promoted improved lateral ribcage mobility with deep breathing. Anterior pelvic tilt  variations progressed from supine to standing; patient demonstrates good response with anterior tilt and lumbar extension. Minimal instability demonstrated when walking, however patient reports feeling unsteady. Recommended patient follow-up with MD on MRI results.   EVAL: Patient is an 83  y.o. female who was seen today for physical therapy evaluation and treatment for compression fx and low back pain. Her pain varies from 5/10 up to 10/10 and is typically worse in the morning x first hour of waking, coughing, and after sitting and transitioning to stand. Evaluation was limited due to high irritability and precautions due to fx. She presents with impairments in ROM, strength, pain, and overall functional mobility. PT recommends lumbar brace due to characteristics of pain during evaluation (winces with coughing and sit<>stand)-- will reach out to request order.  GOALS: Goals reviewed with patient? Yes  UPDATED LONG TERM GOALS:  LONG TERM GOALS: Target date: 12/09/23  The patient will be indep with HEP progression. Baseline:  performing current HEP Goal status: REVISED  2.  The patient will improve Berg balance scale to > or equal to 48/56. Baseline: 44/56 on 11/10/23 Goal status: NEW  3.  The patient will improve 5 time sit<>stand to < or equal to 16 seconds. Baseline: 19.91 seconds Goal status: NEW  4.  The patient will improve neck AROM L rotation to 45 degrees, and L SB to 20 degrees. Baseline:  see above Goal status: PARTIALLY MET  PLAN:  PT FREQUENCY: 2x/week  PT DURATION: 4 weeks  PLANNED INTERVENTIONS: 97164- PT Re-evaluation, 97750- Physical Performance Testing, 97110-Therapeutic exercises, 97530- Therapeutic activity, [id number]- Neuromuscular re-education, 97535- Self Care, [id number]- Manual therapy, [id number]- Gait training, [phone]- Aquatic Therapy, Patient/Family education, Balance training, Cryotherapy, and Moist heat.  PLAN FOR NEXT SESSION: Re-cert next visit. Plan to renew next  week x 6 more weeks.  Lamarr GORMAN Price, PTA 12/07/2023, 12:02 PM

## 2023-12-12 ENCOUNTER — Ambulatory Visit: Admitting: Rehabilitative and Restorative Service Providers"

## 2023-12-12 ENCOUNTER — Encounter: Payer: Self-pay | Admitting: Rehabilitative and Restorative Service Providers"

## 2023-12-12 DIAGNOSIS — M5459 Other low back pain: Secondary | ICD-10-CM | POA: Diagnosis not present

## 2023-12-12 DIAGNOSIS — M6281 Muscle weakness (generalized): Secondary | ICD-10-CM

## 2023-12-12 DIAGNOSIS — R293 Abnormal posture: Secondary | ICD-10-CM

## 2023-12-12 DIAGNOSIS — M542 Cervicalgia: Secondary | ICD-10-CM

## 2023-12-12 NOTE — Therapy (Unsigned)
 OUTPATIENT PHYSICAL THERAPY THORACOLUMBAR TREATMENT AND RECERTIFICATION    Patient Name: Christy Gutierrez MRN: [id number] DOB:1941/02/06, 83 y.o., female Today's Date: 12/12/2023  END OF SESSION:  PT End of Session - 12/12/23 1110     Visit Number 25    Number of Visits 37    Date for Recertification  02/10/24    Authorization Type [employer] medicare    Progress Note Due on Visit 30    PT Start Time 1108    PT Stop Time 1148    PT Time Calculation (min) 40 min    Activity Tolerance Patient tolerated treatment well    Behavior During Therapy WFL for tasks assessed/performed          Past Medical History:  Diagnosis Date   Atrial fibrillation (HCC)    Back pain    Hypertension    Past Surgical History:  Procedure Laterality Date   SKIN CANCER EXCISION  05/2022   on the chest   TONSILLECTOMY     TUBAL LIGATION     bilateral   Patient Active Problem List   Diagnosis Date Noted   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   Atrial fibrillation (HCC) 10/06/2022   Near syncope 09/03/2020   Pulmonary nodule, right 06/26/2019   MDD (major depressive disorder), recurrent episode 12/26/2018   Emphysema lung (HCC) 03/28/2018   Episodic lightheadedness 08/08/2017   Essential hypertension 03/05/2016   Lumbar spondylosis 04/02/2015   Left knee pain 04/02/2015   Migraine 05/08/2013   Osteoporosis 11/10/2009   Vitamin D  deficiency 11/04/2009   Hyperlipidemia 11/04/2009   Backache 11/04/2009   FATIGUE 11/04/2009   Asymptomatic postmenopausal status 11/04/2009   PCP: Alvan Craven MD REFERRING PROVIDER: Alvan Craven MD REFERRING DIAG:  Diagnosis  S32.020A (ICD-10-CM) - Compression fracture of L2 vertebra, initial encounter (HCC)   Rationale for Evaluation and Treatment: Rehabilitation  THERAPY DIAG:  Other low back pain  Muscle weakness (generalized)  Cervicalgia  Abnormal posture  ONSET DATE: 09/13/23   SUBJECTIVE:                                                                                                                                                                                           SUBJEtCTIVE STATEMENT: The patient felt improvement today in her neck without having pain upon waking. She notes neck pain, pressure with reaching both arms forward and maintaining posture (gets pressure in abdomen), and balance. She is doing her HEP and is back to doing her chores.   EVAL: The patient reports that she had a fall on May 28 and hit her head and bumped her arm. She did not notice immediate back  pain, but notes pain began 2 weeks later when she mowed the yard on a riding [employer], [employer]. She is having significant pain that is constant in nature. She cannot get down on the floor (she would get down to pray or exercise prior to this pain) and notes difficulty trying to get up.   PERTINENT HISTORY:  Osteoporosis, H/o back pain from early 90s. A-fib, emphysema, migraine, HTN.  PAIN:  Are you having pain? Yes: NPRS scale: neck: 2/10; can get occasional low back pain  Pain location:  neck Pain description: soreness, gets to be sharp pain and muscle spasms Aggravating factors: getting up after sitting Relieving factors: heating pad,   PRECAUTIONS: Back *precautions due to acute compression fx and osteoporosis  RED FLAGS: Compression fracture: Yes: fall on May 28 with pain beginning 2 weeks post fall   Bowel and bladder: denies issues Denies saddle paresthesias  WEIGHT BEARING RESTRICTIONS: No  FALLS:  Has patient fallen in last 6 months? Yes. Number of falls 1 time in May; she describes 2-3 over the past year  LIVING ENVIRONMENT: Lives with: lives alone Lives in: House/apartment Stairs: Yes: Internal: 12 to basement, 4-5 going into home;  steps; one rail Has following equipment at home: None  PLOF: Independent  PATIENT GOALS: reduce, walk upright, return to doing exercises  OBJECTIVE:  Note: Objective measures were completed at Evaluation unless  otherwise noted.  DIAGNOSTIC FINDINGS:  IMPRESSION: 1. New mild compression deformity of L2, possibly acute. 2. Stable L1 compression deformity. 3. Moderate multilevel degenerative change.   PATIENT SURVEYS:  Modified Oswestry: 54%  COGNITION: Overall cognitive status: Within functional limits for tasks assessed     SENSATION: WFL  POSTURE: No Significant postural limitations  PALPATION: None today-- due to   LUMBAR ROM: Deferred due to recent compression fx  LOWER EXTREMITY ROM:   WFLs  LOWER EXTREMITY MMT:  deferred due to pain-- able to do SLR  MMT Right eval Left eval  Hip flexion    Hip extension    Hip abduction    Hip adduction    Hip internal rotation    Hip external rotation    Knee flexion    Knee extension    Ankle dorsiflexion    Ankle plantarflexion    Ankle inversion    Ankle eversion     (Blank rows = not tested)  LUMBAR SPECIAL TESTS:  Deferred due to compression fx  FUNCTIONAL TESTS:  Log roll for bed mobility instructed and performed  GAIT: Distance walked: 20/13.16 seconds=1.52 ft/sec Assistive device utilized: None Level of assistance: Complete Independence Comments: slowed pace Pain after walking when she first sits down  CERVICAL ROM:   Active ROM A/PROM (deg) 11/10/23 AROM 12/05/23 AROM 12/12/23  Flexion   32 Pain with return to midline  Extension   32   Right lateral flexion 28 22 25   Left lateral flexion 10 18 18   Right rotation 58  40 45  Left rotation 30 40 36   (Blank rows = not tested)  Neuromuscular re-ed: Berg=44/56 Berg 12/05/23=4656  [facility] Adult PT Treatment:                                                DATE: 12/12/23 Therapeutic Exercise: Measuring AROM neck Supine Serratus punches R and L x 2# x 12 reps Seated Neck AROM rotation and  flexion/extension Neuromuscular re-ed: Standing posture re-ed with yoga block behind her head and bilateral UE reaching with wall posterior Standing breath support with  cues for reaching up on inhale (sliding both hands along door frame) and exhaling with bringing arms to starting position Therapeutic Activity: Sit<>stand x5 reps Supine with yoga block under neck while performing bilateral shoulder flexion, W to T x 10 reps Standing 1# one UE lift into shoulder flexion to 90 degrees with back against the wall  Standing wall leans with scapular protraction/retraction    [facility] Adult PT Treatment:                                                DATE: 12/07/2023 Manual Therapy: Gentle cervical traction/passive elongation Rib mobility (Lt>Rt) --> underneath border of ribs; over pressure at ribs on exhale to promote lateral mobility on inhale Neuromuscular re-ed: Supine: Cervical rotation isometric (Lt) with assist from therapist (supine) Chin tucks AROM Chin tuck + gentle cervical elongation PROM Anterior pelvic tilt in hooklying with towel roll at lumbar spine LTR in anterior pelvic tilt with towel roll Standing cat (lumbar extension) with towel roll behind low back at wall Therapeutic Activity: Supine: Gentle neck AROM --> head turns/nods Shoulder horizontal abduction + red TB Bridges x10 Walking with postural cues   [facility] Adult PT Treatment:                                                DATE: 12/05/23 Therapeutic Exercise: Supine Gentle neck AROM Gentle neck PROM with overpressure within tolerance Isometrics L rotation x 5 reps with 3-5 second holds-- this held ROM, but patient continues with L cervical musculature pain NECK AROM: see chart Supine horizontal abduction red band-- x 12 reps - notes mild pain L side with this movement Manual Therapy: STM L scalenes, L upper trap Sidelying scapular mobilization L UE  Neuromuscular re-ed: Supine chin tucks into pillow and then into P.T.s hands  Backwards walking along countertop engaging core Marching in place and then moving forward emphasizing upright core/trunk positioning Alternating foot taps  to 6 step Physical Performance/test measurement: Berg=46/56    [facility] Adult PT Treatment:                                                DATE: 11/30/2023 Manual Therapy: STM cervical paraspinals, upper trap, scalenes Neuromuscular re-ed: Supine chin tucks into pillow 2x5 Standing 1#dowel reach forward (serve the platter) --> very difficult to maintain upright posture Supine on towel roll (vertical along spine): W arm open/close Scissor arms + 1#DB Therapeutic Activity: Hooklying cervical head turns + breathing cues Supine shoulder flexion + dowel with breathing cues Seated shoulder flexion with dowel + breathing cues Standing shoulder flexion with dowel + breathing cues --> very difficult Supine chest press + 3#dowel + breathing cues Upright standing posture + holding yoga block behind back --> green bolster Walking + holding green bolster behind back --> 2#dowel behind back Walking + 5#DB suitcase carry    PATIENT EDUCATION:  Education details: Updated HEP Person educated: Patient Education method: Programmer, multimedia, Demonstration, and  Handouts Education comprehension: verbalized understanding, returned demonstration, and needs further education  HOME EXERCISE PROGRAM: Access Code: [email or web address] URL: [email or web address] Date: 11/30/2023 Prepared by: Lamarr Price  Program Notes THESE SHOULD NOT INCREASE PAIN. If so, wait until you return to therapy.Straw breathing: Inhale through nose, exhale through straw  Exercises - Supine Active Straight Leg Raise  - 1 x daily - 5 x weekly - 1 sets - 10 reps - Heel Raises with Counter Support  - 1 x daily - 5 x weekly - 1 sets - 10 reps - Beginner Bridge  - 1 x daily - 7 x weekly - 3 sets - 10 reps - Sit to Stand Without Arm Support  - 2-3 x daily - 7 x weekly - 1 sets - 5 reps - Seated Cervical Flexion AROM  - 1 x daily - 7 x weekly - 1 sets - 10 reps - Supine Cervical Rotation AROM on Pillow  - 1 x daily - 7 x weekly - 1 sets  - 10 reps - Seated Isometric Cervical Sidebending  - 2 x daily - 7 x weekly - 1 sets - 10 reps - Supine Dead Bug with Leg Extension  - 1 x daily - 7 x weekly - 3 sets - 10 reps - Standing Pursed Lip Breathing with Ribcage Elevation  - 2-3 x daily - 7 x weekly - 1 sets - 10 reps - Seated Thoracic Lumbar Extension with Pectoralis Stretch  - 1-2 x daily - 7 x weekly - 1 sets - 10 reps - 5-10 sec hold - Supine Shoulder Flexion Extension AAROM with Dowel  - 1 x daily - 7 x weekly - 1-2 sets - 10 reps - Seated Shoulder Flexion AAROM with Dowel  - 1 x daily - 7 x weekly - 1-2 sets - 5 reps - Standing Shoulder Flexion AAROM with Dowel  - 1 x daily - 7 x weekly - 1-2 sets - 5 reps   ASSESSMENT: CLINICAL IMPRESSION: The patient partially met LTGs. PT updated plan to include 8 more weeks with focus on neck pain/mobility, postural and core strength, and balance for functional activities. PT to continue to progress to patient tolerance. We did discuss follow up with Dr. Charles in order to review options for ongoing neck pain. Patient reports at times in her history getting injections for medical mgmt of neck pain-- she has had MRI ordered but no MD follow up since that time. The patient is making steady progress in therapy with emphasis on return to prior functional status before a fall she sustained earlier this year.   EVAL: Patient is an 83  y.o. female who was seen today for physical therapy evaluation and treatment for compression fx and low back pain. Her pain varies from 5/10 up to 10/10 and is typically worse in the morning x first hour of waking, coughing, and after sitting and transitioning to stand. Evaluation was limited due to high irritability and precautions due to fx. She presents with impairments in ROM, strength, pain, and overall functional mobility. PT recommends lumbar brace due to characteristics of pain during evaluation (winces with coughing and sit<>stand)-- will reach out to request  order.  GOALS: Goals reviewed with patient? Yes  UPDATED LONG TERM GOALS:  LONG TERM GOALS: Target date: 12/09/23  The patient will be indep with HEP progression. Baseline:  performing current HEP Goal status: MET   2.  The patient will improve Berg balance scale to > or equal to 48/56. Baseline:  44/56 on 11/10/23 Goal status: PARTIALLY MET 46/56 on 12/05/23  3.  The patient will improve 5 time sit<>stand to < or equal to 16 seconds. Baseline: 19.91 seconds Goal status: NOT MET on 12/12/23, continues x 20 seconds  4.  The patient will improve neck AROM L rotation to 45 degrees, and L SB to 20 degrees. Baseline:  see above Goal status: PARTIALLY MET-- 18 deg L SB and 35 deg L rot  UPDATED GOALS: SHORT TERM GOALS: Target date: 01/11/24  The patient will improve L cervical rotation to > or equal to 45 degrees.  Baseline:  see chart Goal status:  NEW  2. The patient will report pain < or equal to 2/10 at worst. Baseline: pain varies day to day Goal status: NEW  LONG TERM GOALS: Target date: 02/10/24   The patient will be indep with HEP progression. Baseline:  performing current HEP Goal status: UPDATED  2.  The patient will improve Berg balance scale to > or equal to 48/56. Baseline: 46/56  Goal status: UPDATED  3.  The patient will improve 5 time sit<>stand to < or equal to 16 seconds. Baseline: 19.91 seconds Goal status: UPDATED  PLAN:  PT FREQUENCY: 2x/week  PT DURATION: 4 weeks  PLANNED INTERVENTIONS: [id number]- PT Re-evaluation, 97750- Physical Performance Testing, 97110-Therapeutic exercises, 97530- Therapeutic activity, [id number]- Neuromuscular re-education, 97535- Self Care, [id number]- Manual therapy, [id number]- Gait training, [id number]- Aquatic Therapy, Patient/Family education, Balance training, Cryotherapy, and Moist heat.  PLAN FOR NEXT SESSION: cervical strengthening, postural strengthening, core engagement, balance training, endurance.   Avir Deruiter,  PT 12/12/2023, 9:23 PM

## 2023-12-15 ENCOUNTER — Ambulatory Visit

## 2023-12-15 DIAGNOSIS — M5459 Other low back pain: Secondary | ICD-10-CM

## 2023-12-15 DIAGNOSIS — M542 Cervicalgia: Secondary | ICD-10-CM

## 2023-12-15 DIAGNOSIS — M6281 Muscle weakness (generalized): Secondary | ICD-10-CM

## 2023-12-15 DIAGNOSIS — R293 Abnormal posture: Secondary | ICD-10-CM

## 2023-12-15 NOTE — Therapy (Signed)
 OUTPATIENT PHYSICAL THERAPY THORACOLUMBAR TREATMENT     Patient Name: Tishawna Larouche MRN: [id number] DOB:[birth year], 83 y.o., female Today's Date: 12/15/2023  END OF SESSION:  PT End of Session - 12/15/23 1057     Visit Number 26    Number of Visits 37    Date for Recertification  02/10/24    Authorization Type aetna medicare    Authorization - Visit Number --    Progress Note Due on Visit 30    PT Start Time 1100    PT Stop Time 1145    PT Time Calculation (min) 45 min    Activity Tolerance Patient tolerated treatment well    Behavior During Therapy WFL for tasks assessed/performed         Past Medical History:  Diagnosis Date   Atrial fibrillation (HCC)    Back pain    Hypertension    Past Surgical History:  Procedure Laterality Date   SKIN CANCER EXCISION  05/2022   on the chest   TONSILLECTOMY     TUBAL LIGATION     bilateral   Patient Active Problem List   Diagnosis Date Noted   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   Atrial fibrillation (HCC) 10/06/2022   Near syncope 09/03/2020   Pulmonary nodule, right 06/26/2019   MDD (major depressive disorder), recurrent episode 12/26/2018   Emphysema lung (HCC) 03/28/2018   Episodic lightheadedness 08/08/2017   Essential hypertension 03/05/2016   Lumbar spondylosis 04/02/2015   Left knee pain 04/02/2015   Migraine 05/08/2013   Osteoporosis 11/10/2009   Vitamin D  deficiency 11/04/2009   Hyperlipidemia 11/04/2009   Backache 11/04/2009   FATIGUE 11/04/2009   Asymptomatic postmenopausal status 11/04/2009   PCP: Alvan Craven MD REFERRING PROVIDER: Alvan Craven MD REFERRING DIAG:  Diagnosis  S32.020A (ICD-10-CM) - Compression fracture of L2 vertebra, initial encounter (HCC)   Rationale for Evaluation and Treatment: Rehabilitation  THERAPY DIAG:  Other low back pain  Muscle weakness (generalized)  Cervicalgia  Abnormal posture  ONSET DATE: 09/13/23   SUBJECTIVE:                                                                                                                                                                                           SUBJEtCTIVE STATEMENT: Patient reports I have the same neck pain, it's always there. Patient states she still has pain when turning head to the left. Patient states she has an appointment with Dr. Sharlene on Tuesday.   EVAL: The patient reports that she had a fall on May 28 and hit her head and bumped her arm. 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 She is having significant pain that is constant in nature. She cannot get down on the floor (she would get down to pray or exercise prior to this pain) and notes difficulty trying to get up.   PERTINENT HISTORY:  Osteoporosis, H/o back pain from early 90s. A-fib, emphysema, migraine, HTN.  PAIN:  Are you having pain? Yes: NPRS scale: neck: 2/10; can get occasional low back pain  Pain location:  neck Pain description: soreness, gets to be sharp pain and muscle spasms Aggravating factors: getting up after sitting Relieving factors: heating pad,   PRECAUTIONS: Back *precautions due to acute compression fx and osteoporosis  RED FLAGS: Compression fracture: Yes: fall on May 28 with pain beginning 2 weeks post fall   Bowel and bladder: denies issues Denies saddle paresthesias  WEIGHT BEARING RESTRICTIONS: No  FALLS:  Has patient fallen in last 6 months? Yes. Number of falls 1 time in May; she describes 2-3 over the past year  LIVING ENVIRONMENT: Lives with: lives alone Lives in: House/apartment Stairs: Yes: Internal: 12 to basement, 4-5 going into home;  steps; one rail Has following equipment at home: None  PLOF: Independent  PATIENT GOALS: reduce, walk upright, return to doing exercises  OBJECTIVE:  Note: Objective measures were completed at Evaluation unless otherwise noted.  DIAGNOSTIC FINDINGS:   IMPRESSION: 1. New mild compression deformity of L2, possibly acute. 2. Stable L1 compression deformity. 3. Moderate multilevel degenerative change.   PATIENT SURVEYS:  Modified Oswestry: 54%  COGNITION: Overall cognitive status: Within functional limits for tasks assessed     SENSATION: WFL  POSTURE: No Significant postural limitations  PALPATION: None today-- due to   LUMBAR ROM: Deferred due to recent compression fx  LOWER EXTREMITY ROM:   WFLs  LOWER EXTREMITY MMT:  deferred due to pain-- able to do SLR  MMT Right eval Left eval  Hip flexion    Hip extension    Hip abduction    Hip adduction    Hip internal rotation    Hip external rotation    Knee flexion    Knee extension    Ankle dorsiflexion    Ankle plantarflexion    Ankle inversion    Ankle eversion     (Blank rows = not tested)  LUMBAR SPECIAL TESTS:  Deferred due to compression fx  FUNCTIONAL TESTS:  Log roll for bed mobility instructed and performed  GAIT: Distance walked: 20/13.16 seconds=1.52 ft/sec Assistive device utilized: None Level of assistance: Complete Independence Comments: slowed pace Pain after walking when she first sits down  CERVICAL ROM:   Active ROM A/PROM (deg) 11/10/23 AROM 12/05/23 AROM 12/12/23  Flexion   32 Pain with return to midline  Extension   32   Right lateral flexion 28 22 25   Left lateral flexion 10 18 18   Right rotation 58  40 45  Left rotation 30 40 36   (Blank rows = not tested)  Neuromuscular re-ed: Berg=44/56 Berg 12/05/23=4656   [facility] Adult PT Treatment:                                                DATE: 12/15/2023 Manual Therapy: Skin rolling along front/lateral ribs Gentle rib mobility --> underneath border of ribs Rib MWM on inhale/exhale +  overpressure to promote  lateral mobility on inhale STM cervical paraspinals, UT/LS Neuromuscular re-ed: Supine cervical retraction with towel roll 10 x 5 sec Seated --> standing elevated ribcage  breathing with side arm raises Therapeutic Activity: Forearms on wall --> breathing cues + thoracic extension Walking with reciprocal arm swing & trunk mobility Seated trunk rotation --> focus on moving from ribs & breathing   [facility] Adult PT Treatment:                                                DATE: 12/12/23 Therapeutic Exercise: Measuring AROM neck Supine Serratus punches R and L x 2# x 12 reps Seated Neck AROM rotation and flexion/extension Neuromuscular re-ed: Standing posture re-ed with yoga block behind her head and bilateral UE reaching with wall posterior Standing breath support with cues for reaching up on inhale (sliding both hands along door frame) and exhaling with bringing arms to starting position Therapeutic Activity: Sit<>stand x5 reps Supine with yoga block under neck while performing bilateral shoulder flexion, W to T x 10 reps Standing 1# one UE lift into shoulder flexion to 90 degrees with back against the wall  Standing wall leans with scapular protraction/retraction    [facility] Adult PT Treatment:                                                DATE: 12/07/2023 Manual Therapy: Gentle cervical traction/passive elongation Rib mobility (Lt>Rt) --> underneath border of ribs; over pressure at ribs on exhale to promote lateral mobility on inhale Neuromuscular re-ed: Supine: Cervical rotation isometric (Lt) with assist from therapist (supine) Chin tucks AROM Chin tuck + gentle cervical elongation PROM Anterior pelvic tilt in hooklying with towel roll at lumbar spine LTR in anterior pelvic tilt with towel roll Standing cat (lumbar extension) with towel roll behind low back at wall Therapeutic Activity: Supine: Gentle neck AROM --> head turns/nods Shoulder horizontal abduction + red TB Bridges x10 Walking with postural cues   PATIENT EDUCATION:  Education details: Updated HEP Person educated: Patient Education method: Programmer, multimedia, Facilities manager, and  Handouts Education comprehension: verbalized understanding, returned demonstration, and needs further education  HOME EXERCISE PROGRAM: Access Code: [email or web address] URL: [email or web address] Date: 11/30/2023 Prepared by: Lamarr Price  Program Notes THESE SHOULD NOT INCREASE PAIN. If so, wait until you return to therapy.Straw breathing: Inhale through nose, exhale through straw  Exercises - Supine Active Straight Leg Raise  - 1 x daily - 5 x weekly - 1 sets - 10 reps - Heel Raises with Counter Support  - 1 x daily - 5 x weekly - 1 sets - 10 reps - Beginner Bridge  - 1 x daily - 7 x weekly - 3 sets - 10 reps - Sit to Stand Without Arm Support  - 2-3 x daily - 7 x weekly - 1 sets - 5 reps - Seated Cervical Flexion AROM  - 1 x daily - 7 x weekly - 1 sets - 10 reps - Supine Cervical Rotation AROM on Pillow  - 1 x daily - 7 x weekly - 1 sets - 10 reps - Seated Isometric Cervical Sidebending  - 2 x daily - 7 x weekly - 1 sets - 10 reps - Supine Dead Bug with Leg  Extension  - 1 x daily - 7 x weekly - 3 sets - 10 reps - Standing Pursed Lip Breathing with Ribcage Elevation  - 2-3 x daily - 7 x weekly - 1 sets - 10 reps - Seated Thoracic Lumbar Extension with Pectoralis Stretch  - 1-2 x daily - 7 x weekly - 1 sets - 10 reps - 5-10 sec hold - Supine Shoulder Flexion Extension AAROM with Dowel  - 1 x daily - 7 x weekly - 1-2 sets - 10 reps - Seated Shoulder Flexion AAROM with Dowel  - 1 x daily - 7 x weekly - 1-2 sets - 5 reps - Standing Shoulder Flexion AAROM with Dowel  - 1 x daily - 7 x weekly - 1-2 sets - 5 reps   ASSESSMENT: CLINICAL IMPRESSION: Good response with manual interventions; noted improvement in lateral ribcage mobility when breathing, as well as patient reporting ability to take deeper inhale/exhale. Improved reciprocal arm swing and trunk rotation demonstrated when walking. Recommended patient add in self-massage along proximal ribs to alleviate tension and seated trunk  rotation focusing on moving from the ribs.  EVAL: Patient is an 83  y.o. female who was seen today for physical therapy evaluation and treatment for compression fx and low back pain. Her pain varies from 5/10 up to 10/10 and is typically worse in the morning x first hour of waking, coughing, and after sitting and transitioning to stand. Evaluation was limited due to high irritability and precautions due to fx. She presents with impairments in ROM, strength, pain, and overall functional mobility. PT recommends lumbar brace due to characteristics of pain during evaluation (winces with coughing and sit<>stand)-- will reach out to request order.  GOALS: Goals reviewed with patient? Yes   UPDATED GOALS: SHORT TERM GOALS: Target date: 01/11/24  The patient will improve L cervical rotation to > or equal to 45 degrees.  Baseline:  see chart Goal status:  NEW  2. The patient will report pain < or equal to 2/10 at worst. Baseline: pain varies day to day Goal status: NEW  LONG TERM GOALS: Target date: 02/10/24   The patient will be indep with HEP progression. Baseline:  performing current HEP Goal status: UPDATED  2.  The patient will improve Berg balance scale to > or equal to 48/56. Baseline: 46/56  Goal status: UPDATED  3.  The patient will improve 5 time sit<>stand to < or equal to 16 seconds. Baseline: 19.91 seconds Goal status: UPDATED  PLAN:  PT FREQUENCY: 2x/week  PT DURATION: 4 weeks  PLANNED INTERVENTIONS: 97164- PT Re-evaluation, 97750- Physical Performance Testing, 97110-Therapeutic exercises, 97530- Therapeutic activity, [id number]- Neuromuscular re-education, 97535- Self Care, [id number]- Manual therapy, [id number]- Gait training, [phone]- Aquatic Therapy, Patient/Family education, Balance training, Cryotherapy, and Moist heat.  PLAN FOR NEXT SESSION: cervical strengthening, postural strengthening, core engagement, balance training, endurance.   Lamarr GORMAN Price, PTA 12/15/2023, 11:46  AM

## 2023-12-19 ENCOUNTER — Ambulatory Visit

## 2023-12-19 DIAGNOSIS — M5459 Other low back pain: Secondary | ICD-10-CM | POA: Diagnosis not present

## 2023-12-19 DIAGNOSIS — M6281 Muscle weakness (generalized): Secondary | ICD-10-CM

## 2023-12-19 DIAGNOSIS — R293 Abnormal posture: Secondary | ICD-10-CM

## 2023-12-19 DIAGNOSIS — M542 Cervicalgia: Secondary | ICD-10-CM

## 2023-12-19 NOTE — Therapy (Signed)
 OUTPATIENT PHYSICAL THERAPY THORACOLUMBAR TREATMENT     Patient Name: Christy Gutierrez MRN: [id number] DOB:February 01, 1941, 83 y.o., female Today's Date: 12/19/2023  END OF SESSION:  PT End of Session - 12/19/23 1100     Visit Number 27    Number of Visits 37    Date for Recertification  02/10/24    Authorization Type aetna medicare    Progress Note Due on Visit 30    PT Start Time 1100    PT Stop Time 1147    PT Time Calculation (min) 47 min    Activity Tolerance Patient tolerated treatment well    Behavior During Therapy WFL for tasks assessed/performed         Past Medical History:  Diagnosis Date   Atrial fibrillation (HCC)    Back pain    Hypertension    Past Surgical History:  Procedure Laterality Date   SKIN CANCER EXCISION  05/2022   on the chest   TONSILLECTOMY     TUBAL LIGATION     bilateral   Patient Active Problem List   Diagnosis Date Noted   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   Atrial fibrillation (HCC) 10/06/2022   Near syncope 09/03/2020   Pulmonary nodule, right 06/26/2019   MDD (major depressive disorder), recurrent episode 12/26/2018   Emphysema lung (HCC) 03/28/2018   Episodic lightheadedness 08/08/2017   Essential hypertension 03/05/2016   Lumbar spondylosis 04/02/2015   Left knee pain 04/02/2015   Migraine 05/08/2013   Osteoporosis 11/10/2009   Vitamin D  deficiency 11/04/2009   Hyperlipidemia 11/04/2009   Backache 11/04/2009   FATIGUE 11/04/2009   Asymptomatic postmenopausal status 11/04/2009   PCP: Alvan Craven MD REFERRING PROVIDER: Alvan Craven MD REFERRING DIAG:  Diagnosis  S32.020A (ICD-10-CM) - Compression fracture of L2 vertebra, initial encounter (HCC)   Rationale for Evaluation and Treatment: Rehabilitation  THERAPY DIAG:  Other low back pain  Muscle weakness (generalized)  Cervicalgia  Abnormal posture  ONSET DATE: 09/13/23   SUBJECTIVE:                                                                                                                                                                                           SUBJEtCTIVE STATEMENT: Patient reports I got the most done around the house then I ever have; states she woke up today with some neck pain, which she took some OTC pain medication. Patient reports 3/10 neck pain, continues to have pain on the Lt side when turning her head to the Lt.   EVAL: The patient reports that she had a fall on May 28 and hit her head and bumped her arm. 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 She is having significant pain that is constant in nature. She cannot get down on the floor (she would get down to pray or exercise prior to this pain) and notes difficulty trying to get up.   PERTINENT HISTORY:  Osteoporosis, H/o back pain from early 90s. A-fib, emphysema, migraine, HTN.  PAIN:  Are you having pain? Yes: NPRS scale: neck: 3/10; can get occasional low back pain  Pain location:  neck Pain description: soreness, gets to be sharp pain and muscle spasms Aggravating factors: getting up after sitting Relieving factors: heating pad,   PRECAUTIONS: Back *precautions due to acute compression fx and osteoporosis  RED FLAGS: Compression fracture: Yes: fall on May 28 with pain beginning 2 weeks post fall   Bowel and bladder: denies issues Denies saddle paresthesias  WEIGHT BEARING RESTRICTIONS: No  FALLS:  Has patient fallen in last 6 months? Yes. Number of falls 1 time in May; she describes 2-3 over the past year  LIVING ENVIRONMENT: Lives with: lives alone Lives in: House/apartment Stairs: Yes: Internal: 12 to basement, 4-5 going into home;  steps; one rail Has following equipment at home: None  PLOF: Independent  PATIENT GOALS: reduce, walk upright, return to doing exercises  OBJECTIVE:  Note: Objective measures were completed at Evaluation unless otherwise  noted.  DIAGNOSTIC FINDINGS:  IMPRESSION: 1. New mild compression deformity of L2, possibly acute. 2. Stable L1 compression deformity. 3. Moderate multilevel degenerative change.   PATIENT SURVEYS:  Modified Oswestry: 54%  COGNITION: Overall cognitive status: Within functional limits for tasks assessed     SENSATION: WFL  POSTURE: No Significant postural limitations  PALPATION: None today-- due to   LUMBAR ROM: Deferred due to recent compression fx  LOWER EXTREMITY ROM:   WFLs  LOWER EXTREMITY MMT:  deferred due to pain-- able to do SLR  MMT Right eval Left eval  Hip flexion    Hip extension    Hip abduction    Hip adduction    Hip internal rotation    Hip external rotation    Knee flexion    Knee extension    Ankle dorsiflexion    Ankle plantarflexion    Ankle inversion    Ankle eversion     (Blank rows = not tested)  LUMBAR SPECIAL TESTS:  Deferred due to compression fx  FUNCTIONAL TESTS:  Log roll for bed mobility instructed and performed  GAIT: Distance walked: 20/13.16 seconds=1.52 ft/sec Assistive device utilized: None Level of assistance: Complete Independence Comments: slowed pace Pain after walking when she first sits down  CERVICAL ROM:   Active ROM A/PROM (deg) 11/10/23 AROM 12/05/23 AROM 12/12/23  Flexion   32 Pain with return to midline  Extension   32   Right lateral flexion 28 22 25   Left lateral flexion 10 18 18   Right rotation 58  40 45  Left rotation 30 40 36   (Blank rows = not tested)  Neuromuscular re-ed: Berg=44/56 Berg 12/05/23=4656   [facility] Adult PT Treatment:                                                DATE: 12/19/2023 Manual Therapy: STM cervical paraspinals, suboccipitals, UT/LS Suboccipital TPR Passive cervical rotation with UT stretch at  end range  Skin rolling along front/lateral ribs Gentle rib mobility --> underneath border of ribs Neuromuscular re-ed: Supine cervical isometrics --> tactile cue Supine  cervical retraction 10 x 3 sec Therapeutic Activity: Supine on horizontal towel roll for thoracic extension + shoulder flexion  Pin & stretch SCM Seated trunk rotation with hands sliding on thighs   [facility] Adult PT Treatment:                                                DATE: 12/15/2023 Manual Therapy: Skin rolling along front/lateral ribs Gentle rib mobility --> underneath border of ribs Rib MWM on inhale/exhale +  overpressure to promote lateral mobility on inhale STM cervical paraspinals, UT/LS Neuromuscular re-ed: Supine cervical retraction with towel roll 10 x 5 sec Seated --> standing elevated ribcage breathing with side arm raises Therapeutic Activity: Forearms on wall --> breathing cues + thoracic extension Walking with reciprocal arm swing & trunk mobility Seated trunk rotation --> focus on moving from ribs & breathing   [facility] Adult PT Treatment:                                                DATE: 12/12/23 Therapeutic Exercise: Measuring AROM neck Supine Serratus punches R and L x 2# x 12 reps Seated Neck AROM rotation and flexion/extension Neuromuscular re-ed: Standing posture re-ed with yoga block behind her head and bilateral UE reaching with wall posterior Standing breath support with cues for reaching up on inhale (sliding both hands along door frame) and exhaling with bringing arms to starting position Therapeutic Activity: Sit<>stand x5 reps Supine with yoga block under neck while performing bilateral shoulder flexion, W to T x 10 reps Standing 1# one UE lift into shoulder flexion to 90 degrees with back against the wall  Standing wall leans with scapular protraction/retraction    [facility] Adult PT Treatment:                                                DATE: 12/07/2023 Manual Therapy: Gentle cervical traction/passive elongation Rib mobility (Lt>Rt) --> underneath border of ribs; over pressure at ribs on exhale to promote lateral mobility on  inhale Neuromuscular re-ed: Supine: Cervical rotation isometric (Lt) with assist from therapist (supine) Chin tucks AROM Chin tuck + gentle cervical elongation PROM Anterior pelvic tilt in hooklying with towel roll at lumbar spine LTR in anterior pelvic tilt with towel roll Standing cat (lumbar extension) with towel roll behind low back at wall Therapeutic Activity: Supine: Gentle neck AROM --> head turns/nods Shoulder horizontal abduction + red TB Bridges x10 Walking with postural cues   PATIENT EDUCATION:  Education details: Updated HEP Person educated: Patient Education method: Programmer, multimedia, Facilities manager, and Handouts Education comprehension: verbalized understanding, returned demonstration, and needs further education  HOME EXERCISE PROGRAM: Access Code: [email or web address] URL: [email or web address] Date: 12/19/2023 Prepared by: Lamarr Price  Program Notes THESE SHOULD NOT INCREASE PAIN. If so, wait until you return to therapy.Straw breathing: Inhale through nose, exhale through straw  Exercises - Supine Active Straight Leg Raise  - 1 x daily - 5 x  weekly - 1 sets - 10 reps - Heel Raises with Counter Support  - 1 x daily - 5 x weekly - 1 sets - 10 reps - Beginner Bridge  - 1 x daily - 7 x weekly - 3 sets - 10 reps - Sit to Stand Without Arm Support  - 2-3 x daily - 7 x weekly - 1 sets - 5 reps - Seated Cervical Flexion AROM  - 1 x daily - 7 x weekly - 1 sets - 10 reps - Supine Cervical Rotation AROM on Pillow  - 1 x daily - 7 x weekly - 1 sets - 10 reps - Seated Isometric Cervical Sidebending  - 2 x daily - 7 x weekly - 1 sets - 10 reps - Supine Dead Bug with Leg Extension  - 1 x daily - 7 x weekly - 3 sets - 10 reps - Standing Pursed Lip Breathing with Ribcage Elevation  - 2-3 x daily - 7 x weekly - 1 sets - 10 reps - Seated Thoracic Lumbar Extension with Pectoralis Stretch  - 1-2 x daily - 7 x weekly - 1 sets - 10 reps - 5-10 sec hold - Supine Shoulder Flexion  Extension AAROM with Dowel  - 1 x daily - 7 x weekly - 1-2 sets - 10 reps - Seated Shoulder Flexion AAROM with Dowel  - 1 x daily - 7 x weekly - 1-2 sets - 5 reps - Standing Shoulder Flexion AAROM with Dowel  - 1 x daily - 7 x weekly - 1-2 sets - 5 reps - Sternocleidomastoid Stretch  - 1 x daily - 7 x weekly - 3 sets - 10 reps - Seated Trunk Rotation - Hands on Shoulders  - 1 x daily - 7 x weekly - 3 sets - 10 reps  ASSESSMENT: CLINICAL IMPRESSION: Continued ribcage mobilization and diaphragmatic breathing variations; improved lateral ribcage mobility with cueing for balloon breathing. Decreased myofascial tension noted with palpitation along anterior/lateral ribcage and along sternum; patient reports more improved ease with breathing and tolerating more physical activity throughout the day. Initial neck tension decreased significantly by end of session; encouraged patient to continue HEP daily to address strength and pain deficits.  EVAL: Patient is an 83  y.o. female who was seen today for physical therapy evaluation and treatment for compression fx and low back pain. Her pain varies from 5/10 up to 10/10 and is typically worse in the morning x first hour of waking, coughing, and after sitting and transitioning to stand. Evaluation was limited due to high irritability and precautions due to fx. She presents with impairments in ROM, strength, pain, and overall functional mobility. PT recommends lumbar brace due to characteristics of pain during evaluation (winces with coughing and sit<>stand)-- will reach out to request order.  GOALS: Goals reviewed with patient? Yes   UPDATED GOALS: SHORT TERM GOALS: Target date: 01/11/24  The patient will improve L cervical rotation to > or equal to 45 degrees.  Baseline:  see chart Goal status:  NEW  2. The patient will report pain < or equal to 2/10 at worst. Baseline: pain varies day to day Goal status: NEW  LONG TERM GOALS: Target date: 02/10/24    The patient will be indep with HEP progression. Baseline:  performing current HEP Goal status: UPDATED  2.  The patient will improve Berg balance scale to > or equal to 48/56. Baseline: 46/56  Goal status: UPDATED  3.  The patient will improve 5 time  sit<>stand to < or equal to 16 seconds. Baseline: 19.91 seconds Goal status: UPDATED  PLAN:  PT FREQUENCY: 2x/week  PT DURATION: 4 weeks  PLANNED INTERVENTIONS: 97164- PT Re-evaluation, 97750- Physical Performance Testing, 97110-Therapeutic exercises, 97530- Therapeutic activity, [id number]- Neuromuscular re-education, 97535- Self Care, [id number]- Manual therapy, [id number]- Gait training, [phone]- Aquatic Therapy, Patient/Family education, Balance training, Cryotherapy, and Moist heat.  PLAN FOR NEXT SESSION: cervical strengthening, postural strengthening, core engagement, balance training, endurance, ribcage mobility/trunk roation.   Lamarr GORMAN Price, PTA 12/19/2023, 11:49 AM

## 2023-12-20 ENCOUNTER — Ambulatory Visit (INDEPENDENT_AMBULATORY_CARE_PROVIDER_SITE_OTHER)

## 2023-12-20 ENCOUNTER — Ambulatory Visit: Payer: Self-pay

## 2023-12-20 ENCOUNTER — Other Ambulatory Visit: Payer: Self-pay

## 2023-12-20 ENCOUNTER — Ambulatory Visit

## 2023-12-20 VITALS — BP 128/88 | Ht 66.0 in | Wt 145.0 lb

## 2023-12-20 DIAGNOSIS — M7918 Myalgia, other site: Secondary | ICD-10-CM | POA: Diagnosis not present

## 2023-12-20 DIAGNOSIS — M542 Cervicalgia: Secondary | ICD-10-CM | POA: Diagnosis not present

## 2023-12-20 MED ORDER — METHYLPREDNISOLONE ACETATE 40 MG/ML IJ SUSP
40.0000 mg | Freq: Once | INTRAMUSCULAR | Status: AC
  Administered 2023-12-20: 40 mg via INTRAMUSCULAR

## 2023-12-20 NOTE — Progress Notes (Signed)
   Subjective:    Patient ID: Christy Gutierrez, female    DOB: 83 y.o., [birth year]   MRN: [id number]  Chief Complaint: Cervical spine spondylosis - MRI review  Discussed the use of AI scribe software for clinical note transcription with the patient, who gave verbal consent to proceed.  History of Present Illness Christy Gutierrez is an 83 year old female with chronic neck pain and arthritis who presents with persistent neck pain.  Cervicalgia and limited range of motion - Persistent neck pain located at the inferior cervical region - Difficulty turning head, especially to one side - Pain and tightness most pronounced on the left side of the neck - Pain present upon waking and persists throughout the day - Physical therapy twice weekly provides some improvement, with pain becoming more manageable after sessions - Has achieved approximately 75% improvement in daily activities, including ability to complete house cleaning for the first time in months  History of vertebral compression fractures and chronic back pain - History of compression fractures secondary to a fall - Initial presentation included severe back pain - Chronic arthritis contributing to ongoing musculoskeletal symptoms     Objective:   Vitals:   12/20/23 1424  BP: 128/88   Cervical Spine There are several areas of prominent painful nodules and taut bands of tissue contained in the musculature from the base of the neck through the upper back.  Trigger point injection with Ultrasound Guidance Procedure Note Mairim Bade 03-19-1940 Indications: Pain Procedure Details Following the description of risks including infection, bleeding, and damage to surrounding structures patient provided written consent for bilateral neck musculature trigger point injection with ultrasound guidance. Ultrasound was used to identify the applicable locations (for which they were 6). The patient was then prepped in the usual fashion with chlorhexidine.  Following topical anesthetization with ethyl chloride, the patient was injected into the aforementioned 6 locations with a solution of 1 cc sodium HCO3 8.4%, 40 mg Depo-Medrol, 4 cc of lidocaine 1% (1 cc each was injected into each location). This was well visualized under ultrasound, please see associated photographic documentation. Patient tolerated well without complication. Precautions provided. Cleaned and dressing applied.      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  Endorsing 75% improvement in function with physical therapy only. Pain is more pronounced on the left side, although MRI shows more right-sided issues. Shared decision-making considered injections to manage pain and improve function after discussion of risks and benefits.  Continue physical therapy. Administer trigger point injections in six spots (three on each side) with Depo-Medrol, bicarb, and lidocaine. Consider referral to a spine specialist for further evaluation and management if limited benefit from trigger point injections.  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 Pain is less severe than neck pain but persists. Physical therapy has improved function and managed pain. The role of guarding and muscle strain in exacerbating symptoms was discussed. Continue physical therapy to manage symptoms and improve function.

## 2023-12-21 ENCOUNTER — Ambulatory Visit

## 2023-12-21 DIAGNOSIS — M542 Cervicalgia: Secondary | ICD-10-CM

## 2023-12-21 DIAGNOSIS — M6281 Muscle weakness (generalized): Secondary | ICD-10-CM

## 2023-12-21 DIAGNOSIS — R293 Abnormal posture: Secondary | ICD-10-CM

## 2023-12-21 DIAGNOSIS — M5459 Other low back pain: Secondary | ICD-10-CM

## 2023-12-21 NOTE — Therapy (Signed)
 OUTPATIENT PHYSICAL THERAPY THORACOLUMBAR TREATMENT     Patient Name: Christy Gutierrez MRN: [id number] DOB:08-22-1940, 83 y.o., female Today's Date: 12/21/2023  END OF SESSION:  PT End of Session - 12/21/23 1104     Visit Number 28    Number of Visits 37    Date for Recertification  02/10/24    Authorization Type [employer] medicare    Progress Note Due on Visit 30    PT Start Time 1104    PT Stop Time 1147    PT Time Calculation (min) 43 min    Activity Tolerance Patient tolerated treatment well    Behavior During Therapy WFL for tasks assessed/performed         Past Medical History:  Diagnosis Date   Atrial fibrillation (HCC)    Back pain    Hypertension    Past Surgical History:  Procedure Laterality Date   SKIN CANCER EXCISION  05/2022   on the chest   TONSILLECTOMY     TUBAL LIGATION     bilateral   Patient Active Problem List   Diagnosis Date Noted   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   Atrial fibrillation (HCC) 10/06/2022   Near syncope 09/03/2020   Pulmonary nodule, right 06/26/2019   MDD (major depressive disorder), recurrent episode 12/26/2018   Emphysema lung (HCC) 03/28/2018   Episodic lightheadedness 08/08/2017   Essential hypertension 03/05/2016   Lumbar spondylosis 04/02/2015   Left knee pain 04/02/2015   Migraine 05/08/2013   Osteoporosis 11/10/2009   Vitamin D  deficiency 11/04/2009   Hyperlipidemia 11/04/2009   Backache 11/04/2009   FATIGUE 11/04/2009   Asymptomatic postmenopausal status 11/04/2009   PCP: Alvan Craven MD REFERRING PROVIDER: Alvan Craven MD REFERRING DIAG:  Diagnosis  S32.020A (ICD-10-CM) - Compression fracture of L2 vertebra, initial encounter (HCC)   Rationale for Evaluation and Treatment: Rehabilitation  THERAPY DIAG:  Other low back pain  Muscle weakness (generalized)  Cervicalgia  Abnormal posture  ONSET DATE: 09/13/23   SUBJECTIVE:                                                                                                                                                                                           SUBJEtCTIVE STATEMENT: Patient reports 2/10 pain in neck today, states she got injections on both sides of neck yesterday and feels she is able to turn her head further and with less pain in Lt.   EVAL: The patient reports that she had a fall on May 28 and hit her head and bumped her arm. She did not notice immediate back pain, but notes pain began 2 weeks later when she mowed the yard  on a riding [employer]. She is having significant pain that is constant in nature. She cannot get down on the floor (she would get down to pray or exercise prior to this pain) and notes difficulty trying to get up.   PERTINENT HISTORY:  Osteoporosis, H/o back pain from early 90s. A-fib, emphysema, migraine, HTN.  PAIN:  Are you having pain? Yes: NPRS scale: neck: 2/10; can get occasional low back pain  Pain location:  neck Pain description: soreness, gets to be sharp pain and muscle spasms Aggravating factors: getting up after sitting Relieving factors: heating pad,   PRECAUTIONS: Back *precautions due to acute compression fx and osteoporosis  RED FLAGS: Compression fracture: Yes: fall on May 28 with pain beginning 2 weeks post fall   Bowel and bladder: denies issues Denies saddle paresthesias  WEIGHT BEARING RESTRICTIONS: No  FALLS:  Has patient fallen in last 6 months? Yes. Number of falls 1 time in May; she describes 2-3 over the past year  LIVING ENVIRONMENT: Lives with: lives alone Lives in: House/apartment Stairs: Yes: Internal: 12 to basement, 4-5 going into home;  steps; one rail Has following equipment at home: None  PLOF: Independent  PATIENT GOALS: reduce, walk upright, return to doing exercises  OBJECTIVE:  Note: Objective measures were completed at Evaluation unless otherwise noted.  DIAGNOSTIC FINDINGS:  IMPRESSION: 1. New mild compression deformity of L2, possibly  acute. 2. Stable L1 compression deformity. 3. Moderate multilevel degenerative change.   PATIENT SURVEYS:  Modified Oswestry: 54%  COGNITION: Overall cognitive status: Within functional limits for tasks assessed     SENSATION: WFL  POSTURE: No Significant postural limitations  PALPATION: None today-- due to   LUMBAR ROM: Deferred due to recent compression fx  LOWER EXTREMITY ROM:   WFLs  LOWER EXTREMITY MMT:  deferred due to pain-- able to do SLR  MMT Right eval Left eval  Hip flexion    Hip extension    Hip abduction    Hip adduction    Hip internal rotation    Hip external rotation    Knee flexion    Knee extension    Ankle dorsiflexion    Ankle plantarflexion    Ankle inversion    Ankle eversion     (Blank rows = not tested)  LUMBAR SPECIAL TESTS:  Deferred due to compression fx  FUNCTIONAL TESTS:  Log roll for bed mobility instructed and performed  GAIT: Distance walked: 20/13.16 seconds=1.52 ft/sec Assistive device utilized: None Level of assistance: Complete Independence Comments: slowed pace Pain after walking when she first sits down  CERVICAL ROM:   Active ROM A/PROM (deg) 11/10/23 AROM 12/05/23 AROM 12/12/23  Flexion   32 Pain with return to midline  Extension   32   Right lateral flexion 28 22 25   Left lateral flexion 10 18 18   Right rotation 58  40 45  Left rotation 30 40 36   (Blank rows = not tested)  Neuromuscular re-ed: Berg=44/56 Berg 12/05/23=4656   [facility] Adult PT Treatment:                                                DATE: 12/21/2023 Manual Therapy: Skin rolling along ribcage STM cervical paraspinals, suboccipitals, very gentle cervical traction Neuromuscular re-ed: Supine:  Cervical isometrics --> Lt rotation, extension Cervical retraction  Scap punches + 2#DB Shoulder horizontal  abduction + red TB --> palms up x10, palm down + head turns x 10 Therapeutic Activity: Seated trunk rotation + hand slide on thighs -->  added push/pull with feet Seated --> standing low bicep curls + 2#DB Standing wall slides --> added 1#DB  Walking + reciprocal arm swing holding 2#DB  Walking with focus in trunk rotational release Oppositional wall slides with chest lift   [facility] Adult PT Treatment:                                                DATE: 12/19/2023 Manual Therapy: STM cervical paraspinals, suboccipitals, UT/LS Suboccipital TPR Passive cervical rotation with UT stretch at end range  Skin rolling along front/lateral ribs Gentle rib mobility --> underneath border of ribs Neuromuscular re-ed: Supine cervical isometrics --> tactile cue Supine cervical retraction 10 x 3 sec Therapeutic Activity: Supine on horizontal towel roll for thoracic extension + shoulder flexion  Pin & stretch SCM Seated trunk rotation with hands sliding on thighs   [facility] Adult PT Treatment:                                                DATE: 12/15/2023 Manual Therapy: Skin rolling along front/lateral ribs Gentle rib mobility --> underneath border of ribs Rib MWM on inhale/exhale +  overpressure to promote lateral mobility on inhale STM cervical paraspinals, UT/LS Neuromuscular re-ed: Supine cervical retraction with towel roll 10 x 5 sec Seated --> standing elevated ribcage breathing with side arm raises Therapeutic Activity: Forearms on wall --> breathing cues + thoracic extension Walking with reciprocal arm swing & trunk mobility Seated trunk rotation --> focus on moving from ribs & breathing   PATIENT EDUCATION:  Education details: Updated HEP Person educated: Patient Education method: Explanation, Demonstration, and Handouts Education comprehension: verbalized understanding, returned demonstration, and needs further education  HOME EXERCISE PROGRAM: Access Code: [email or web address] URL: [email or web address] Date: 12/19/2023 Prepared by: Lamarr Price  Program Notes THESE SHOULD NOT INCREASE PAIN. If so, wait  until you return to therapy.Straw breathing: Inhale through nose, exhale through straw  Exercises - Supine Active Straight Leg Raise  - 1 x daily - 5 x weekly - 1 sets - 10 reps - Heel Raises with Counter Support  - 1 x daily - 5 x weekly - 1 sets - 10 reps - Beginner Bridge  - 1 x daily - 7 x weekly - 3 sets - 10 reps - Sit to Stand Without Arm Support  - 2-3 x daily - 7 x weekly - 1 sets - 5 reps - Seated Cervical Flexion AROM  - 1 x daily - 7 x weekly - 1 sets - 10 reps - Supine Cervical Rotation AROM on Pillow  - 1 x daily - 7 x weekly - 1 sets - 10 reps - Seated Isometric Cervical Sidebending  - 2 x daily - 7 x weekly - 1 sets - 10 reps - Supine Dead Bug with Leg Extension  - 1 x daily - 7 x weekly - 3 sets - 10 reps - Standing Pursed Lip Breathing with Ribcage Elevation  - 2-3 x daily - 7 x weekly - 1 sets - 10 reps - Seated Thoracic Lumbar Extension with Pectoralis Stretch  -  1-2 x daily - 7 x weekly - 1 sets - 10 reps - 5-10 sec hold - Supine Shoulder Flexion Extension AAROM with Dowel  - 1 x daily - 7 x weekly - 1-2 sets - 10 reps - Seated Shoulder Flexion AAROM with Dowel  - 1 x daily - 7 x weekly - 1-2 sets - 5 reps - Standing Shoulder Flexion AAROM with Dowel  - 1 x daily - 7 x weekly - 1-2 sets - 5 reps - Sternocleidomastoid Stretch  - 1 x daily - 7 x weekly - 3 sets - 10 reps - Seated Trunk Rotation - Hands on Shoulders  - 1 x daily - 7 x weekly - 3 sets - 10 reps  ASSESSMENT: CLINICAL IMPRESSION: Progressed postural strengthening with added light resistance with standing exercises; patient tolerated addition of light weight with shoulder flexion wall slides well with minimal discomfort on Lt side of neck/shoulder. Noted decreased trunk rotation and tendency to hold arms in slight extension when walking; cueing postural modifications improved trunk mobility with decreased postural tension, however mild unsteadiness noted (no LOB).   EVAL: Patient is an 83  y.o. female who was  seen today for physical therapy evaluation and treatment for compression fx and low back pain. Her pain varies from 5/10 up to 10/10 and is typically worse in the morning x first hour of waking, coughing, and after sitting and transitioning to stand. Evaluation was limited due to high irritability and precautions due to fx. She presents with impairments in ROM, strength, pain, and overall functional mobility. PT recommends lumbar brace due to characteristics of pain during evaluation (winces with coughing and sit<>stand)-- will reach out to request order.  GOALS: Goals reviewed with patient? Yes   UPDATED GOALS: SHORT TERM GOALS: Target date: 01/11/24  The patient will improve L cervical rotation to > or equal to 45 degrees.  Baseline:  see chart Goal status:  NEW  2. The patient will report pain < or equal to 2/10 at worst. Baseline: pain varies day to day Goal status: NEW  LONG TERM GOALS: Target date: 02/10/24   The patient will be indep with HEP progression. Baseline:  performing current HEP Goal status: UPDATED  2.  The patient will improve Berg balance scale to > or equal to 48/56. Baseline: 46/56  Goal status: UPDATED  3.  The patient will improve 5 time sit<>stand to < or equal to 16 seconds. Baseline: 19.91 seconds Goal status: UPDATED  PLAN:  PT FREQUENCY: 2x/week  PT DURATION: 4 weeks  PLANNED INTERVENTIONS: [id number]- PT Re-evaluation, 97750- Physical Performance Testing, 97110-Therapeutic exercises, 97530- Therapeutic activity, [id number]- Neuromuscular re-education, 97535- Self Care, [id number]- Manual therapy, [id number]- Gait training, [phone]- Aquatic Therapy, Patient/Family education, Balance training, Cryotherapy, and Moist heat.  PLAN FOR NEXT SESSION: cervical strengthening, postural strengthening, core engagement, balance training, endurance, ribcage mobility/trunk roation.   Lamarr GORMAN Price, PTA 12/21/2023, 11:52 AM

## 2023-12-26 ENCOUNTER — Ambulatory Visit

## 2023-12-26 DIAGNOSIS — M5459 Other low back pain: Secondary | ICD-10-CM | POA: Diagnosis not present

## 2023-12-26 DIAGNOSIS — R293 Abnormal posture: Secondary | ICD-10-CM

## 2023-12-26 DIAGNOSIS — M542 Cervicalgia: Secondary | ICD-10-CM

## 2023-12-26 DIAGNOSIS — M6281 Muscle weakness (generalized): Secondary | ICD-10-CM

## 2023-12-26 NOTE — Therapy (Signed)
 OUTPATIENT PHYSICAL THERAPY THORACOLUMBAR TREATMENT     Patient Name: Christy Gutierrez MRN: [id number] DOB:1940-04-20, 83 y.o., female Today's Date: 12/26/2023  END OF SESSION:  PT End of Session - 12/26/23 1108     Visit Number 29    Number of Visits 37    Date for Recertification  02/10/24    Authorization Type aetna medicare    Authorization - Visit Number 29    Progress Note Due on Visit 30    PT Start Time 1107    PT Stop Time 1150    PT Time Calculation (min) 43 min    Activity Tolerance Patient tolerated treatment well    Behavior During Therapy WFL for tasks assessed/performed         Past Medical History:  Diagnosis Date   Atrial fibrillation (HCC)    Back pain    Hypertension    Past Surgical History:  Procedure Laterality Date   SKIN CANCER EXCISION  05/2022   on the chest   TONSILLECTOMY     TUBAL LIGATION     bilateral   Patient Active Problem List   Diagnosis Date Noted   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   Atrial fibrillation (HCC) 10/06/2022   Near syncope 09/03/2020   Pulmonary nodule, right 06/26/2019   MDD (major depressive disorder), recurrent episode 12/26/2018   Emphysema lung (HCC) 03/28/2018   Episodic lightheadedness 08/08/2017   Essential hypertension 03/05/2016   Lumbar spondylosis 04/02/2015   Left knee pain 04/02/2015   Migraine 05/08/2013   Osteoporosis 11/10/2009   Vitamin D  deficiency 11/04/2009   Hyperlipidemia 11/04/2009   Backache 11/04/2009   FATIGUE 11/04/2009   Asymptomatic postmenopausal status 11/04/2009   PCP: Alvan Craven MD REFERRING PROVIDER: Alvan Craven MD REFERRING DIAG:  Diagnosis  S32.020A (ICD-10-CM) - Compression fracture of L2 vertebra, initial encounter (HCC)   Rationale for Evaluation and Treatment: Rehabilitation  THERAPY DIAG:  Other low back pain  Muscle weakness (generalized)  Cervicalgia  Abnormal posture  ONSET DATE: 09/13/23   SUBJECTIVE:                                                                                                                                                                                           SUBJEtCTIVE STATEMENT: Patient reports she had a good time at her son's horse show and did a lot of walking over two days, states later in the day she would feel like the tight bad was pulling me down. Patient states she had a few wobbles in the stalls but no falls or fear of falling. Patient states she has less pain in neck when turning to the  left since getting injections. Patient states no new increase in neck or back pain today.  EVAL: The patient reports that she had a fall on May 28 and hit her head and bumped her arm. 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 She is having significant pain that is constant in nature. She cannot get down on the floor (she would get down to pray or exercise prior to this pain) and notes difficulty trying to get up.   PERTINENT HISTORY:  Osteoporosis, H/o back pain from early 90s. A-fib, emphysema, migraine, HTN.  PAIN:  Are you having pain? Yes: NPRS scale: neck: 2/10; can get occasional low back pain  Pain location:  neck Pain description: soreness, gets to be sharp pain and muscle spasms Aggravating factors: getting up after sitting Relieving factors: heating pad,   PRECAUTIONS: Back *precautions due to acute compression fx and osteoporosis  RED FLAGS: Compression fracture: Yes: fall on May 28 with pain beginning 2 weeks post fall   Bowel and bladder: denies issues Denies saddle paresthesias  WEIGHT BEARING RESTRICTIONS: No  FALLS:  Has patient fallen in last 6 months? Yes. Number of falls 1 time in May; she describes 2-3 over the past year  LIVING ENVIRONMENT: Lives with: lives alone Lives in: House/apartment Stairs: Yes: Internal: 12 to basement, 4-5 going into home;  steps; one rail Has following equipment at home:  None  PLOF: Independent  PATIENT GOALS: reduce, walk upright, return to doing exercises  OBJECTIVE:  Note: Objective measures were completed at Evaluation unless otherwise noted.  DIAGNOSTIC FINDINGS:  IMPRESSION: 1. New mild compression deformity of L2, possibly acute. 2. Stable L1 compression deformity. 3. Moderate multilevel degenerative change.   PATIENT SURVEYS:  Modified Oswestry: 54%  COGNITION: Overall cognitive status: Within functional limits for tasks assessed     SENSATION: WFL  POSTURE: No Significant postural limitations  PALPATION: None today-- due to   LUMBAR ROM: Deferred due to recent compression fx  LOWER EXTREMITY ROM:   WFLs  LOWER EXTREMITY MMT:  deferred due to pain-- able to do SLR  MMT Right eval Left eval  Hip flexion    Hip extension    Hip abduction    Hip adduction    Hip internal rotation    Hip external rotation    Knee flexion    Knee extension    Ankle dorsiflexion    Ankle plantarflexion    Ankle inversion    Ankle eversion     (Blank rows = not tested)  LUMBAR SPECIAL TESTS:  Deferred due to compression fx  FUNCTIONAL TESTS:  Log roll for bed mobility instructed and performed  GAIT: Distance walked: 20/13.16 seconds=1.52 ft/sec Assistive device utilized: None Level of assistance: Complete Independence Comments: slowed pace Pain after walking when she first sits down  CERVICAL ROM:   Active ROM A/PROM (deg) 11/10/23 AROM 12/05/23 AROM 12/12/23  Flexion   32 Pain with return to midline  Extension   32   Right lateral flexion 28 22 25   Left lateral flexion 10 18 18   Right rotation 58  40 45  Left rotation 30 40 36   (Blank rows = not tested)  Neuromuscular re-ed: Berg=44/56 Berg 12/05/23=4656   [facility] Adult PT Treatment:  DATE: 12/26/2023 Therapeutic Exercise: Seated cervical flexion AROM Cervical stretches --> UT, SMC, scalenes Neuromuscular re-ed: Rolling  orange PB up the wall + breathing in overhead reach Modified tandem stance balance Stride stance weight shifting forward + opp arm overhead reaching to target Therapeutic Activity: Seated:  Trunk rotation, initiating from ribs --> hand slides on thighs Modified cat/cow variations (small cat) with hands behind head Side bend with support from orange PB Seated hip hinge --> hands on thighs Sit <--> stand with hands on thighs Walking with reciprocal arm swing & trunk rotation Standing hip abd x10 (bil)   [facility] Adult PT Treatment:                                                DATE: 12/21/2023 Manual Therapy: Skin rolling along ribcage STM cervical paraspinals, suboccipitals, very gentle cervical traction Neuromuscular re-ed: Supine:  Cervical isometrics --> Lt rotation, extension Cervical retraction  Scap punches + 2#DB Shoulder horizontal abduction + red TB --> palms up x10, palm down + head turns x 10 Therapeutic Activity: Seated trunk rotation + hand slide on thighs --> added push/pull with feet Seated --> standing low bicep curls + 2#DB Standing wall slides --> added 1#DB  Walking + reciprocal arm swing holding 2#DB  Walking with focus in trunk rotational release Oppositional wall slides with chest lift   [facility] Adult PT Treatment:                                                DATE: 12/19/2023 Manual Therapy: STM cervical paraspinals, suboccipitals, UT/LS Suboccipital TPR Passive cervical rotation with UT stretch at end range  Skin rolling along front/lateral ribs Gentle rib mobility --> underneath border of ribs Neuromuscular re-ed: Supine cervical isometrics --> tactile cue Supine cervical retraction 10 x 3 sec Therapeutic Activity: Supine on horizontal towel roll for thoracic extension + shoulder flexion  Pin & stretch SCM Seated trunk rotation with hands sliding on thighs   PATIENT EDUCATION:  Education details: Updated HEP Person educated: Patient Education  method: Programmer, Multimedia, Demonstration, and Handouts Education comprehension: verbalized understanding, returned demonstration, and needs further education  HOME EXERCISE PROGRAM: Access Code: [email or web address] URL: [email or web address] Date: 12/26/2023 Prepared by: Lamarr Price  Program Notes THESE SHOULD NOT INCREASE PAIN. If so, wait until you return to therapy.Straw breathing: Inhale through nose, exhale through straw  Exercises - Supine Active Straight Leg Raise  - 1 x daily - 5 x weekly - 1 sets - 10 reps - Beginner Bridge  - 1 x daily - 7 x weekly - 3 sets - 10 reps - Sit to Stand Without Arm Support  - 2-3 x daily - 7 x weekly - 1 sets - 5 reps - Seated Cervical Flexion AROM  - 1 x daily - 7 x weekly - 1 sets - 10 reps - Supine Cervical Rotation AROM on Pillow  - 1 x daily - 7 x weekly - 1 sets - 10 reps - Seated Isometric Cervical Sidebending  - 2 x daily - 7 x weekly - 1 sets - 10 reps - Standing Pursed Lip Breathing with Ribcage Elevation  - 2-3 x daily - 7 x weekly - 1 sets - 10 reps - Seated  Thoracic Lumbar Extension with Pectoralis Stretch  - 1-2 x daily - 7 x weekly - 1 sets - 10 reps - 5-10 sec hold - Sternocleidomastoid Stretch  - 1 x daily - 7 x weekly - 3 sets - 10 reps - Seated Trunk Rotation - Hands on Shoulders  - 1 x daily - 7 x weekly - 3 sets - 10 reps - Standing Tandem Balance with Counter Support  - 1 x daily - 7 x weekly - 1 sets - 3-5 reps - 10-30 sec hold - Stride Stance Weight Shift with Opposite Arm Reach  - 1 x daily - 7 x weekly - 3 sets - 10 reps  ASSESSMENT: CLINICAL IMPRESSION: Session focused on standing exercises to progress endurance and challenge dynamic balance. Cueing wider stance with tandem balance improved stability; less stability with tendency towards Lt side LOB with Rt leg forward in tandem variation. Oppositional overhead reaching incorporated with forward weight shifting; patient tolerated well with minimal neck pain and improved  breathing mechanics.   EVAL: Patient is an 83  y.o. female who was seen today for physical therapy evaluation and treatment for compression fx and low back pain. Her pain varies from 5/10 up to 10/10 and is typically worse in the morning x first hour of waking, coughing, and after sitting and transitioning to stand. Evaluation was limited due to high irritability and precautions due to fx. She presents with impairments in ROM, strength, pain, and overall functional mobility. PT recommends lumbar brace due to characteristics of pain during evaluation (winces with coughing and sit<>stand)-- will reach out to request order.  GOALS: Goals reviewed with patient? Yes   UPDATED GOALS: SHORT TERM GOALS: Target date: 01/11/24  The patient will improve L cervical rotation to > or equal to 45 degrees.  Baseline:  see chart Goal status:  NEW  2. The patient will report pain < or equal to 2/10 at worst. Baseline: pain varies day to day Goal status: NEW  LONG TERM GOALS: Target date: 02/10/24   The patient will be indep with HEP progression. Baseline:  performing current HEP Goal status: UPDATED  2.  The patient will improve Berg balance scale to > or equal to 48/56. Baseline: 46/56  Goal status: UPDATED  3.  The patient will improve 5 time sit<>stand to < or equal to 16 seconds. Baseline: 19.91 seconds Goal status: UPDATED  PLAN:  PT FREQUENCY: 2x/week  PT DURATION: 4 weeks  PLANNED INTERVENTIONS: 97164- PT Re-evaluation, 97750- Physical Performance Testing, 97110-Therapeutic exercises, 97530- Therapeutic activity, [id number]- Neuromuscular re-education, 97535- Self Care, [id number]- Manual therapy, [id number]- Gait training, [phone]- Aquatic Therapy, Patient/Family education, Balance training, Cryotherapy, and Moist heat.  PLAN FOR NEXT SESSION: dynamic balance, overhead reaching activities; cervical strengthening, postural strengthening, core engagement, balance training, endurance, ribcage  mobility/trunk roation.   Lamarr GORMAN Price, PTA 12/26/2023, 11:56 AM

## 2023-12-27 ENCOUNTER — Ambulatory Visit

## 2023-12-28 ENCOUNTER — Ambulatory Visit

## 2023-12-28 DIAGNOSIS — M542 Cervicalgia: Secondary | ICD-10-CM

## 2023-12-28 DIAGNOSIS — M6281 Muscle weakness (generalized): Secondary | ICD-10-CM

## 2023-12-28 DIAGNOSIS — M5459 Other low back pain: Secondary | ICD-10-CM

## 2023-12-28 DIAGNOSIS — R293 Abnormal posture: Secondary | ICD-10-CM

## 2023-12-28 NOTE — Therapy (Signed)
 OUTPATIENT PHYSICAL THERAPY THORACOLUMBAR TREATMENT  Progress Note Reporting Period 11/23/2023 to 12/28/2023  See note below for Objective Data and Assessment of Progress/Goals.       Patient Name: Christy Gutierrez MRN: [id number] DOB:09/07/1940, 83 y.o., female Today's Date: 12/28/2023  END OF SESSION:  PT End of Session - 12/28/23 1103     Visit Number 30    Number of Visits 37    Date for Recertification  02/10/24    Authorization Type aetna medicare    Authorization - Visit Number 30    Progress Note Due on Visit 30    PT Start Time 1103    Activity Tolerance Patient tolerated treatment well    Behavior During Therapy [facility] for tasks assessed/performed         Past Medical History:  Diagnosis Date   Atrial fibrillation (HCC)    Back pain    Hypertension    Past Surgical History:  Procedure Laterality Date   SKIN CANCER EXCISION  05/2022   on the chest   TONSILLECTOMY     TUBAL LIGATION     bilateral   Patient Active Problem List   Diagnosis Date Noted   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   Atrial fibrillation (HCC) 10/06/2022   Near syncope 09/03/2020   Pulmonary nodule, right 06/26/2019   MDD (major depressive disorder), recurrent episode 12/26/2018   Emphysema lung (HCC) 03/28/2018   Episodic lightheadedness 08/08/2017   Essential hypertension 03/05/2016   Lumbar spondylosis 04/02/2015   Left knee pain 04/02/2015   Migraine 05/08/2013   Osteoporosis 11/10/2009   Vitamin D  deficiency 11/04/2009   Hyperlipidemia 11/04/2009   Backache 11/04/2009   FATIGUE 11/04/2009   Asymptomatic postmenopausal status 11/04/2009   PCP: Alvan Craven MD REFERRING PROVIDER: Alvan Craven MD REFERRING DIAG:  Diagnosis  S32.020A (ICD-10-CM) - Compression fracture of L2 vertebra, initial encounter (HCC)   Rationale for Evaluation and Treatment: Rehabilitation  THERAPY DIAG:  Other low back pain  Muscle weakness (generalized)  Cervicalgia  Abnormal  posture  ONSET DATE: 09/13/23   SUBJECTIVE:                                                                                                                                                                                          SUBJEtCTIVE STATEMENT: Patient reports she felt good after last session, no increased pain in neck  or back. Patient states she sees some improvement with activities where she is holding something out in front of her; states she is able to do chores for longer before needing to take a break. Patient states she continues to have  a band of tightness around  midsection that makes everything uncomfortable.   EVAL: The patient reports that she had a fall on May 28 and hit her head and bumped her arm. 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 She is having significant pain that is constant in nature. She cannot get down on the floor (she would get down to pray or exercise prior to this pain) and notes difficulty trying to get up.   PERTINENT HISTORY:  Osteoporosis, H/o back pain from early 90s. A-fib, emphysema, migraine, HTN.  PAIN:  Are you having pain? Yes: NPRS scale: neck: 2/10 (Lt); can get occasional low back pain lots of physical activity  Pain location:  neck Pain description: soreness, gets to be sharp pain and muscle spasms Aggravating factors: getting up after sitting Relieving factors: heating pad,   PRECAUTIONS: Back *precautions due to acute compression fx and osteoporosis  RED FLAGS: Compression fracture: Yes: fall on May 28 with pain beginning 2 weeks post fall   Bowel and bladder: denies issues Denies saddle paresthesias  WEIGHT BEARING RESTRICTIONS: No  FALLS:  Has patient fallen in last 6 months? Yes. Number of falls 1 time in May; she describes 2-3 over the past year  LIVING ENVIRONMENT: Lives with: lives alone Lives in: House/apartment Stairs: Yes: Internal: 12  to basement, 4-5 going into home;  steps; one rail Has following equipment at home: None  PLOF: Independent  PATIENT GOALS: reduce, walk upright, return to doing exercises  OBJECTIVE:  Note: Objective measures were completed at Evaluation unless otherwise noted.  DIAGNOSTIC FINDINGS:  IMPRESSION: 1. New mild compression deformity of L2, possibly acute. 2. Stable L1 compression deformity. 3. Moderate multilevel degenerative change.   PATIENT SURVEYS:  Modified Oswestry: 54%  COGNITION: Overall cognitive status: Within functional limits for tasks assessed     SENSATION: WFL  POSTURE: No Significant postural limitations  PALPATION: None today-- due to   LUMBAR ROM: Deferred due to recent compression fx  LOWER EXTREMITY ROM:   WFLs  LOWER EXTREMITY MMT:  deferred due to pain-- able to do SLR  MMT Right eval Left eval  Hip flexion    Hip extension    Hip abduction    Hip adduction    Hip internal rotation    Hip external rotation    Knee flexion    Knee extension    Ankle dorsiflexion    Ankle plantarflexion    Ankle inversion    Ankle eversion     (Blank rows = not tested)  LUMBAR SPECIAL TESTS:  Deferred due to compression fx  FUNCTIONAL TESTS:  Log roll for bed mobility instructed and performed  GAIT: Distance walked: 20/13.16 seconds=1.52 ft/sec Assistive device utilized: None Level of assistance: Complete Independence Comments: slowed pace Pain after walking when she first sits down  CERVICAL ROM:   Active ROM A/PROM (deg) 11/10/23 AROM 12/05/23 AROM 12/12/23 AROM 12/28/23  Flexion   32 Pain with return to midline 34 Pain on Lt side coming back up  Extension   32  33  Right lateral flexion 28 22 25 18   Left lateral flexion 10 18 18 14   Right rotation 58  40 45 35  Left rotation 30 40 36 34   (Blank rows = not tested)  Neuromuscular re-ed: Berg=44/56 Berg 12/05/23=4656   [facility] Adult PT Treatment:  DATE: 12/28/2023 Therapeutic Exercise: Seated cervical stretches --> upper trap, platysma Corner pec stretch --> arms low, mid, high Cervical measurements (see above)  Neuromuscular re-ed: Standing arm slides up wall in corner --> tactile cues to promote elevating ribcage away from pelvis Rows + green TB with cues for chest lift Therapeutic Activity: Seated thoracic extension with rolled yoga mat vertical along spine Walking + navigating busy environment Seated forward reaching towards 90 degrees with 1#DB    [facility] Adult PT Treatment:                                                DATE: 12/26/2023 Therapeutic Exercise: Seated cervical flexion AROM Cervical stretches --> UT, SMC, scalenes Neuromuscular re-ed: Rolling orange PB up the wall + breathing in overhead reach Modified tandem stance balance Stride stance weight shifting forward + opp arm overhead reaching to target Therapeutic Activity: Seated:  Trunk rotation, initiating from ribs --> hand slides on thighs Modified cat/cow variations (small cat) with hands behind head Side bend with support from orange PB Seated hip hinge --> hands on thighs Sit <--> stand with hands on thighs Walking with reciprocal arm swing & trunk rotation Standing hip abd x10 (bil)   [facility] Adult PT Treatment:                                                DATE: 12/21/2023 Manual Therapy: Skin rolling along ribcage STM cervical paraspinals, suboccipitals, very gentle cervical traction Neuromuscular re-ed: Supine:  Cervical isometrics --> Lt rotation, extension Cervical retraction  Scap punches + 2#DB Shoulder horizontal abduction + red TB --> palms up x10, palm down + head turns x 10 Therapeutic Activity: Seated trunk rotation + hand slide on thighs --> added push/pull with feet Seated --> standing low bicep curls + 2#DB Standing wall slides --> added 1#DB  Walking + reciprocal arm swing holding 2#DB  Walking with focus in trunk  rotational release Oppositional wall slides with chest lift   PATIENT EDUCATION:  Education details: Updated HEP Person educated: Patient Education method: Programmer, Multimedia, Facilities Manager, and Handouts Education comprehension: verbalized understanding, returned demonstration, and needs further education  HOME EXERCISE PROGRAM: Access Code: [email or web address] URL: [email or web address] Date: 12/26/2023 Prepared by: Lamarr Price  Program Notes THESE SHOULD NOT INCREASE PAIN. If so, wait until you return to therapy.Straw breathing: Inhale through nose, exhale through straw  Exercises - Supine Active Straight Leg Raise  - 1 x daily - 5 x weekly - 1 sets - 10 reps - Beginner Bridge  - 1 x daily - 7 x weekly - 3 sets - 10 reps - Sit to Stand Without Arm Support  - 2-3 x daily - 7 x weekly - 1 sets - 5 reps - Seated Cervical Flexion AROM  - 1 x daily - 7 x weekly - 1 sets - 10 reps - Supine Cervical Rotation AROM on Pillow  - 1 x daily - 7 x weekly - 1 sets - 10 reps - Seated Isometric Cervical Sidebending  - 2 x daily - 7 x weekly - 1 sets - 10 reps - Standing Pursed Lip Breathing with Ribcage Elevation  - 2-3 x daily - 7 x weekly - 1 sets - 10 reps -  Seated Thoracic Lumbar Extension with Pectoralis Stretch  - 1-2 x daily - 7 x weekly - 1 sets - 10 reps - 5-10 sec hold - Sternocleidomastoid Stretch  - 1 x daily - 7 x weekly - 3 sets - 10 reps - Seated Trunk Rotation - Hands on Shoulders  - 1 x daily - 7 x weekly - 3 sets - 10 reps - Standing Tandem Balance with Counter Support  - 1 x daily - 7 x weekly - 1 sets - 3-5 reps - 10-30 sec hold - Stride Stance Weight Shift with Opposite Arm Reach  - 1 x daily - 7 x weekly - 3 sets - 10 reps  ASSESSMENT: CLINICAL IMPRESSION: No significant change in cervical AROM (see above); patient continues to have pain when turning head to the Lt. Continued overhead and forward reaching activities to challenge spinal stabilization and trunk elongation.  Patient challenged with addition of light weights with forward reaching at 90 degrees. Verbal and tactile cues provided to promote ribcage elevation with overhead reaching. Noted tendency to remain close to objects along perimeter when walking through busy environment due to fear of falling. Patient will continue to benefit from skilled therapy to address postural deficits and improve ability to participate in ADLs safely and with increased ease.   EVAL: Patient is an 83  y.o. female who was seen today for physical therapy evaluation and treatment for compression fx and low back pain. Her pain varies from 5/10 up to 10/10 and is typically worse in the morning x first hour of waking, coughing, and after sitting and transitioning to stand. Evaluation was limited due to high irritability and precautions due to fx. She presents with impairments in ROM, strength, pain, and overall functional mobility. PT recommends lumbar brace due to characteristics of pain during evaluation (winces with coughing and sit<>stand)-- will reach out to request order.  GOALS: Goals reviewed with patient? Yes   UPDATED GOALS: SHORT TERM GOALS: Target date: 01/11/24  The patient will improve L cervical rotation to > or equal to 45 degrees.  Baseline:  see chart Goal status:  NEW  2. The patient will report pain < or equal to 2/10 at worst. Baseline: pain varies day to day Goal status: NEW  LONG TERM GOALS: Target date: 02/10/24   The patient will be indep with HEP progression. Baseline:  performing current HEP Goal status: UPDATED  2.  The patient will improve Berg balance scale to > or equal to 48/56. Baseline: 46/56  Goal status: UPDATED  3.  The patient will improve 5 time sit<>stand to < or equal to 16 seconds. Baseline: 19.91 seconds Goal status: UPDATED  PLAN:  PT FREQUENCY: 2x/week  PT DURATION: 4 weeks  PLANNED INTERVENTIONS: 97164- PT Re-evaluation, 97750- Physical Performance Testing,  97110-Therapeutic exercises, 97530- Therapeutic activity, [id number]- Neuromuscular re-education, 97535- Self Care, 02859- Manual therapy, [id number]- Gait training, [phone]- Aquatic Therapy, Patient/Family education, Balance training, Cryotherapy, and Moist heat.  PLAN FOR NEXT SESSION: dynamic balance, overhead reaching activities; cervical strengthening, postural strengthening, core engagement, balance training, endurance, ribcage mobility/trunk roation.   Lamarr GORMAN Price, PTA 12/28/2023, 11:05 AM

## 2023-12-30 NOTE — Progress Notes (Signed)
 HPI: FU atrial fibrillation. ABIs November 2023 showed 0.71 on the left and 0.87 on the right. TSH July 2024 1.26. Echocardiogram August 2024 showed normal LV function, mild left atrial enlargement, mild to moderate mitral regurgitation, mild aortic insufficiency. Patient previously noted to have atrial fibrillation intraoperatively. Since last seen, she denies dyspnea, chest pain or syncope.  Occasional palpitations.  Mild fatigue.  Current Outpatient Medications  Medication Sig Dispense Refill   acetaminophen  (TYLENOL ) 650 MG CR tablet Take 1 tablet (650 mg total) by mouth every 8 (eight) hours as needed for pain.     Acetaminophen -Caffeine 500-65 MG TABS Take by mouth.     albuterol  (VENTOLIN  HFA) 108 (90 Base) MCG/ACT inhaler TAKE 2 PUFFS BY MOUTH EVERY 6 HOURS AS NEEDED FOR WHEEZE OR SHORTNESS OF BREATH 18 g 3   betamethasone  valerate ointment (VALISONE ) 0.1 % Apply topically daily. 45 g 1   calcitonin, salmon, (MIACALCIN/FORTICAL) 200 UNIT/ACT nasal spray Place 1 spray into alternate nostrils daily. 8.1 mL 1   Calcium Carb-Cholecalciferol [phone] MG-UNIT TABS Take by mouth.     co-enzyme Q-10 30 MG capsule Take 30 mg by mouth 3 (three) times daily.     diclofenac  sodium (VOLTAREN ) 1 % GEL Apply 4 g topically 4 (four) times daily. To affected joint. 100 g 11   diltiazem  (CARDIZEM  CD) 120 MG 24 hr capsule Take 1 capsule (120 mg total) by mouth daily. 90 capsule 3   ELIQUIS  5 MG TABS tablet TAKE 1 TABLET TWICE A DAY 180 tablet 1   escitalopram  (LEXAPRO ) 20 MG tablet TAKE 1 TABLET DAILY 90 tablet 0   fexofenadine  (ALLEGRA ) 180 MG tablet Take 1 tablet (180 mg total) by mouth at bedtime. 90 tablet 3   fluticasone  (FLONASE ) 50 MCG/ACT nasal spray Place 1 spray into both nostrils daily. 16 g 3   icosapent  Ethyl (VASCEPA ) 1 g capsule Take 2 capsules (2 g total) by mouth 2 (two) times daily. 360 capsule 3   lisinopril  (ZESTRIL ) 5 MG tablet TAKE 1 TABLET (5 MG TOTAL) BY MOUTH DAILY. 90 tablet 1    Multiple Vitamins-Minerals (MULTIVITAMIN WITH MINERALS) tablet Take 1 tablet by mouth daily.     SPIRIVA  RESPIMAT 2.5 MCG/ACT AERS USE 1 INHALATION ORALLY    DAILY 8 g 3   No current facility-administered medications for this visit.     Past Medical History:  Diagnosis Date   Atrial fibrillation (HCC)    Back pain    Hypertension     Past Surgical History:  Procedure Laterality Date   SKIN CANCER EXCISION  05/2022   on the chest   TONSILLECTOMY     TUBAL LIGATION     bilateral    Social History   Socioeconomic History   Marital status: Widowed    Spouse name: Not on file   Number of children: 2   Years of education: 51   Highest education level: 12th grade  Occupational History   Occupation: Human Resources Officer    Comment: retired  Tobacco Use   Smoking status: Never   Smokeless tobacco: Never  Vaping Use   Vaping status: Never Used  Substance and Sexual Activity   Alcohol use: No   Drug use: Never   Sexual activity: Not Currently  Other Topics Concern   Not on file  Social History Narrative   Lives alone. She has two children who live close by. Works on the farm still.   Social Drivers of Corporate Investment Banker  Strain: Low Risk  (09/04/2023)   Overall Financial Resource Strain (CARDIA)    Difficulty of Paying Living Expenses: Not hard at all  Food Insecurity: No Food Insecurity (09/04/2023)   Hunger Vital Sign    Worried About Running Out of Food in the Last Year: Never true    Ran Out of Food in the Last Year: Never true  Transportation Needs: No Transportation Needs (09/04/2023)   PRAPARE - Administrator, Civil Service (Medical): No    Lack of Transportation (Non-Medical): No  Physical Activity: Inactive (09/04/2023)   Exercise Vital Sign    Days of Exercise per Week: 0 days    Minutes of Exercise per Session: Not on file  Stress: No Stress Concern Present (09/04/2023)   [employer] of Occupational Health - Occupational Stress Questionnaire     Feeling of Stress: Not at all  Social Connections: Moderately Isolated (09/04/2023)   Social Connection and Isolation Panel    Frequency of Communication with Friends and Family: More than three times a week    Frequency of Social Gatherings with Friends and Family: Twice a week    Attends Religious Services: More than 4 times per year    Active Member of [employer] or Organizations: No    Attends Banker Meetings: Not on file    Marital Status: Widowed  Intimate Partner Violence: Not At Risk (07/19/2023)   Humiliation, Afraid, Rape, and Kick questionnaire    Fear of Current or Ex-Partner: No    Emotionally Abused: No    Physically Abused: No    Sexually Abused: No    Family History  Problem Relation Age of Onset   Alcohol abuse Father    Cancer Brother     ROS: no fevers or chills, productive cough, hemoptysis, dysphasia, odynophagia, melena, hematochezia, dysuria, hematuria, rash, seizure activity, orthopnea, PND, pedal edema, claudication. Remaining systems are negative.  Physical Exam: Well-developed well-nourished in no acute distress.  Skin is warm and dry.  HEENT is normal.  Neck is supple.  Chest is clear to auscultation with normal expansion.  Cardiovascular exam is irregular Abdominal exam nontender or distended. No masses palpated. Extremities show no edema. neuro grossly intact  EKG Interpretation Date/Time:  Monday January 09 2024 15:02:38 EST Ventricular Rate:  100 PR Interval:    QRS Duration:  88 QT Interval:  388 QTC Calculation: 500 R Axis:   265  Text Interpretation: Atrial fibrillation Right superior axis deviation Low voltage QRS Cannot rule out Anterior infarct Confirmed by Pietro Rogue ([id number]) on 01/09/2024 3:08:04 PM     A/P  1 atrial fibrillation-this was previously documented intraoperatively July 2024.  The duration of her atrial fibrillation therefore is unknown.  I previously recommended attempt at cardioversion but she  preferred to be conservative with rate control and still feels that way.  She understands the longer she is in this less likely sinus could be restored.  Continue cardizem ; continue apixaban .  Will arrange 3-day Zio patch to make sure heart rate is controlled.  2 history of aortic and mitral regurgitation-plan FU echo.   3 hypertension-BP controlled; continue present meds.   Rogue Pietro, MD

## 2024-01-01 ENCOUNTER — Other Ambulatory Visit: Payer: Self-pay | Admitting: Family Medicine

## 2024-01-01 DIAGNOSIS — F32A Depression, unspecified: Secondary | ICD-10-CM

## 2024-01-02 ENCOUNTER — Ambulatory Visit: Attending: Family Medicine

## 2024-01-02 DIAGNOSIS — M6281 Muscle weakness (generalized): Secondary | ICD-10-CM | POA: Diagnosis present

## 2024-01-02 DIAGNOSIS — R293 Abnormal posture: Secondary | ICD-10-CM | POA: Diagnosis present

## 2024-01-02 DIAGNOSIS — M542 Cervicalgia: Secondary | ICD-10-CM | POA: Insufficient documentation

## 2024-01-02 DIAGNOSIS — M5459 Other low back pain: Secondary | ICD-10-CM | POA: Insufficient documentation

## 2024-01-02 NOTE — Therapy (Signed)
 OUTPATIENT PHYSICAL THERAPY THORACOLUMBAR TREATMENT      Patient Name: Christy Gutierrez MRN: [id number] DOB:11-17-1940, 83 y.o., female Today's Date: 01/02/2024  END OF SESSION:  PT End of Session - 01/02/24 1059     Visit Number 31    Number of Visits 37    Date for Recertification  02/10/24    Authorization Type aetna medicare    PT Start Time 1100    PT Stop Time 1143    PT Time Calculation (min) 43 min    Activity Tolerance Patient tolerated treatment well    Behavior During Therapy WFL for tasks assessed/performed         Past Medical History:  Diagnosis Date   Atrial fibrillation (HCC)    Back pain    Hypertension    Past Surgical History:  Procedure Laterality Date   SKIN CANCER EXCISION  05/2022   on the chest   TONSILLECTOMY     TUBAL LIGATION     bilateral   Patient Active Problem List   Diagnosis Date Noted   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   Atrial fibrillation (HCC) 10/06/2022   Near syncope 09/03/2020   Pulmonary nodule, right 06/26/2019   MDD (major depressive disorder), recurrent episode 12/26/2018   Emphysema lung (HCC) 03/28/2018   Episodic lightheadedness 08/08/2017   Essential hypertension 03/05/2016   Lumbar spondylosis 04/02/2015   Left knee pain 04/02/2015   Migraine 05/08/2013   Osteoporosis 11/10/2009   Vitamin D  deficiency 11/04/2009   Hyperlipidemia 11/04/2009   Backache 11/04/2009   FATIGUE 11/04/2009   Asymptomatic postmenopausal status 11/04/2009   PCP: Alvan Craven MD REFERRING PROVIDER: Alvan Craven MD REFERRING DIAG:  Diagnosis  S32.020A (ICD-10-CM) - Compression fracture of L2 vertebra, initial encounter (HCC)   Rationale for Evaluation and Treatment: Rehabilitation  THERAPY DIAG:  Other low back pain  Muscle weakness (generalized)  Cervicalgia  Abnormal posture  ONSET DATE: 09/13/23   SUBJECTIVE:                                                                                                                                                                                           SUBJEtCTIVE STATEMENT: Patient reports she did a lot of yard work over the weekend and cleaning her house, states her neck was hurting today and took OTC pain medication prior to PT session today. Patient states she continues to have pain on Lt side on neck but it is not nearly as bad as it was prior to the injections. Patient states she is able to hold a note longer when singing.  EVAL: The patient reports that she had a fall on May  28 and hit her head and bumped her arm. 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 She is having significant pain that is constant in nature. She cannot get down on the floor (she would get down to pray or exercise prior to this pain) and notes difficulty trying to get up.   PERTINENT HISTORY:  Osteoporosis, H/o back pain from early 90s. A-fib, emphysema, migraine, HTN.  PAIN:  Are you having pain? Yes: NPRS scale: neck: 2/10 (Lt); can get occasional low back pain lots of physical activity  Pain location:  neck Pain description: soreness, gets to be sharp pain and muscle spasms Aggravating factors: getting up after sitting Relieving factors: heating pad,   PRECAUTIONS: Back *precautions due to acute compression fx and osteoporosis  RED FLAGS: Compression fracture: Yes: fall on May 28 with pain beginning 2 weeks post fall   Bowel and bladder: denies issues Denies saddle paresthesias  WEIGHT BEARING RESTRICTIONS: No  FALLS:  Has patient fallen in last 6 months? Yes. Number of falls 1 time in May; she describes 2-3 over the past year  LIVING ENVIRONMENT: Lives with: lives alone Lives in: House/apartment Stairs: Yes: Internal: 12 to basement, 4-5 going into home;  steps; one rail Has following equipment at home: None  PLOF: Independent  PATIENT GOALS: reduce, walk upright, return to doing  exercises  OBJECTIVE:  Note: Objective measures were completed at Evaluation unless otherwise noted.  DIAGNOSTIC FINDINGS:  IMPRESSION: 1. New mild compression deformity of L2, possibly acute. 2. Stable L1 compression deformity. 3. Moderate multilevel degenerative change.   PATIENT SURVEYS:  Modified Oswestry: 54%  COGNITION: Overall cognitive status: Within functional limits for tasks assessed     SENSATION: WFL  POSTURE: No Significant postural limitations  PALPATION: None today-- due to   LUMBAR ROM: Deferred due to recent compression fx  LOWER EXTREMITY ROM:   WFLs  LOWER EXTREMITY MMT:  deferred due to pain-- able to do SLR  MMT Right eval Left eval  Hip flexion    Hip extension    Hip abduction    Hip adduction    Hip internal rotation    Hip external rotation    Knee flexion    Knee extension    Ankle dorsiflexion    Ankle plantarflexion    Ankle inversion    Ankle eversion     (Blank rows = not tested)  LUMBAR SPECIAL TESTS:  Deferred due to compression fx  FUNCTIONAL TESTS:  Log roll for bed mobility instructed and performed  GAIT: Distance walked: 20/13.16 seconds=1.52 ft/sec Assistive device utilized: None Level of assistance: Complete Independence Comments: slowed pace Pain after walking when she first sits down  CERVICAL ROM:   Active ROM A/PROM (deg) 11/10/23 AROM 12/05/23 AROM 12/12/23 AROM 12/28/23  Flexion   32 Pain with return to midline 34 Pain on Lt side coming back up  Extension   32  33  Right lateral flexion 28 22 25 18   Left lateral flexion 10 18 18 14   Right rotation 58  40 45 35  Left rotation 30 40 36 34   (Blank rows = not tested)  Neuromuscular re-ed: Berg=44/56 Berg 12/05/23=4656   [facility] Adult PT Treatment:  DATE: 01/02/2024 Neuromuscular re-ed: Standing forward arm reaching at 90 degrees + 1#WW --> no weights  Seated forward arm reaching at 90 degrees + noodle  for postural awareness Unilateral reach forward at 90 degrees + opp hand behind the head Scapula retraction + chest lift 10 x10 sec hold Therapeutic Activity: Prone prop on elbows x 3 rounds Standing arm slides up wall in corner --> rib lift away from pelvis Supine on incline: Chest press 1#dowel --> palms down x10 , palms up x10   Arm raises + 1#WW (bil) -> palms down x10, palms up x10    [facility] Adult PT Treatment:                                                DATE: 12/28/2023 Therapeutic Exercise: Seated cervical stretches --> upper trap, platysma Corner pec stretch --> arms low, mid, high Cervical measurements (see above)  Neuromuscular re-ed: Standing arm slides up wall in corner --> tactile cues to promote elevating ribcage away from pelvis Rows + green TB with cues for chest lift Therapeutic Activity: Seated thoracic extension with rolled yoga mat vertical along spine Walking + navigating busy environment Seated forward reaching towards 90 degrees with 1#DB    [facility] Adult PT Treatment:                                                DATE: 12/26/2023 Therapeutic Exercise: Seated cervical flexion AROM Cervical stretches --> UT, SMC, scalenes Neuromuscular re-ed: Rolling orange PB up the wall + breathing in overhead reach Modified tandem stance balance Stride stance weight shifting forward + opp arm overhead reaching to target Therapeutic Activity: Seated:  Trunk rotation, initiating from ribs --> hand slides on thighs Modified cat/cow variations (small cat) with hands behind head Side bend with support from orange PB Seated hip hinge --> hands on thighs Sit <--> stand with hands on thighs Walking with reciprocal arm swing & trunk rotation Standing hip abd x10 (bil)   PATIENT EDUCATION:  Education details: Updated HEP Person educated: Patient Education method: Explanation, Demonstration, and Handouts Education comprehension: verbalized understanding, returned  demonstration, and needs further education  HOME EXERCISE PROGRAM: Access Code: [email or web address] URL: [email or web address] Date: 12/26/2023 Prepared by: Lamarr Price  Program Notes THESE SHOULD NOT INCREASE PAIN. If so, wait until you return to therapy.Straw breathing: Inhale through nose, exhale through straw  Exercises - Supine Active Straight Leg Raise  - 1 x daily - 5 x weekly - 1 sets - 10 reps - Beginner Bridge  - 1 x daily - 7 x weekly - 3 sets - 10 reps - Sit to Stand Without Arm Support  - 2-3 x daily - 7 x weekly - 1 sets - 5 reps - Seated Cervical Flexion AROM  - 1 x daily - 7 x weekly - 1 sets - 10 reps - Supine Cervical Rotation AROM on Pillow  - 1 x daily - 7 x weekly - 1 sets - 10 reps - Seated Isometric Cervical Sidebending  - 2 x daily - 7 x weekly - 1 sets - 10 reps - Standing Pursed Lip Breathing with Ribcage Elevation  - 2-3 x daily - 7 x weekly - 1 sets - 10  reps - Seated Thoracic Lumbar Extension with Pectoralis Stretch  - 1-2 x daily - 7 x weekly - 1 sets - 10 reps - 5-10 sec hold - Sternocleidomastoid Stretch  - 1 x daily - 7 x weekly - 3 sets - 10 reps - Seated Trunk Rotation - Hands on Shoulders  - 1 x daily - 7 x weekly - 3 sets - 10 reps - Standing Tandem Balance with Counter Support  - 1 x daily - 7 x weekly - 1 sets - 3-5 reps - 10-30 sec hold - Stride Stance Weight Shift with Opposite Arm Reach  - 1 x daily - 7 x weekly - 3 sets - 10 reps  ASSESSMENT: CLINICAL IMPRESSION: Patient continues to have difficulty maintaining upright trunk posture with reaching forward at 90 degrees; improved upright posture when modifying arms to scaption range. Reaching activity modified to focus on scapula retraction mechanics and maintaining shoulder stability with forward reaching.    EVAL: Patient is an 83  y.o. female who was seen today for physical therapy evaluation and treatment for compression fx and low back pain. Her pain varies from 5/10 up to 10/10 and is  typically worse in the morning x first hour of waking, coughing, and after sitting and transitioning to stand. Evaluation was limited due to high irritability and precautions due to fx. She presents with impairments in ROM, strength, pain, and overall functional mobility. PT recommends lumbar brace due to characteristics of pain during evaluation (winces with coughing and sit<>stand)-- will reach out to request order.  GOALS: Goals reviewed with patient? Yes   UPDATED GOALS: SHORT TERM GOALS: Target date: 01/11/24  The patient will improve L cervical rotation to > or equal to 45 degrees.  Baseline:  see chart Goal status:  NEW  2. The patient will report pain < or equal to 2/10 at worst. Baseline: pain varies day to day Goal status: NEW  LONG TERM GOALS: Target date: 02/10/24   The patient will be indep with HEP progression. Baseline:  performing current HEP Goal status: UPDATED  2.  The patient will improve Berg balance scale to > or equal to 48/56. Baseline: 46/56  Goal status: UPDATED  3.  The patient will improve 5 time sit<>stand to < or equal to 16 seconds. Baseline: 19.91 seconds Goal status: UPDATED  PLAN:  PT FREQUENCY: 2x/week  PT DURATION: 4 weeks  PLANNED INTERVENTIONS: [id number]- PT Re-evaluation, 97750- Physical Performance Testing, 97110-Therapeutic exercises, 97530- Therapeutic activity, [id number]- Neuromuscular re-education, 97535- Self Care, [id number]- Manual therapy, [id number]- Gait training, [phone]- Aquatic Therapy, Patient/Family education, Balance training, Cryotherapy, and Moist heat.  PLAN FOR NEXT SESSION: dynamic balance, overhead reaching activities; cervical strengthening, postural strengthening, core engagement, balance training, endurance, ribcage mobility/trunk roation.   Lamarr GORMAN Price, PTA 01/02/2024, 11:44 AM

## 2024-01-03 ENCOUNTER — Other Ambulatory Visit: Payer: Self-pay

## 2024-01-03 ENCOUNTER — Ambulatory Visit (INDEPENDENT_AMBULATORY_CARE_PROVIDER_SITE_OTHER)

## 2024-01-03 VITALS — BP 118/86 | Ht 66.0 in | Wt 145.0 lb

## 2024-01-03 DIAGNOSIS — M7918 Myalgia, other site: Secondary | ICD-10-CM

## 2024-01-03 DIAGNOSIS — M542 Cervicalgia: Secondary | ICD-10-CM

## 2024-01-03 DIAGNOSIS — M47812 Spondylosis without myelopathy or radiculopathy, cervical region: Secondary | ICD-10-CM

## 2024-01-03 NOTE — Addendum Note (Signed)
 Addended by: MARCINE HARLENE SAILOR on: 01/03/2024 01:54 PM   Modules accepted: Orders

## 2024-01-03 NOTE — Progress Notes (Signed)
   Subjective:    Patient ID: Christy Gutierrez, female    DOB: 83 y.o., August 30, 1940   MRN: [id number]  Chief Complaint: Neck pain  Discussed the use of AI scribe software for clinical note transcription with the patient, who gave verbal consent to proceed.  Trigger point injections helped perhaps 80% initially but then this effect faded and today she has had discomfort in that area return to some degree. Still attending PT 2x/week. History of Present Illness Christy Gutierrez is an 83 year old female who presents with worsening neck pain after initial improvement from trigger point injections.  Cervicalgia (neck pain) - Worsening neck pain after initial improvement from trigger point injection - Initial improvement of approximately 80% following injection, allowing easier head movement, especially while driving - Current pain at approximately 40% improvement, averaging 60% improvement overall since injection - Pain primarily localized to the left side of the neck, described as a 'half moon' shape, radiating from one side to the other  Upper extremity functional limitation - Difficulty holding arms out or carrying items at shoulder level - No improvement in ability to perform exercises requiring arm extension or holding objects despite ongoing therapy  Physical therapy response - Attends physical therapy twice weekly - Physical therapy has provided benefit for neck pain and ambulation - Therapy is addressing upper extremity functional limitations      Objective:   Vitals:   01/03/24 1108  BP: 118/86   Cervical Spine There are several areas of prominent painful nodules and taut bands of tissue contained in the musculature from the base of the neck through the upper back.  Trigger point injection with Ultrasound Guidance Procedure Note Christy Gutierrez 1940/03/22 Indications: Pain Procedure Details Following the description of risks including infection, bleeding, and damage to surrounding  structures patient provided written consent for bilateral neck musculature trigger point injection with ultrasound guidance. Ultrasound was used to identify the applicable locations (for which they were 6). The patient was then prepped in the usual fashion with chlorhexidine. Following topical anesthetization with ethyl chloride, the patient was injected into the aforementioned 6 locations with a solution of 2 cc sodium HCO3 8.4%, 40 mg Depo-Medrol, 6 cc of lidocaine 1% (1 cc each was injected into each location). This was well visualized under ultrasound, please see associated photographic documentation. Patient tolerated well without complication. Precautions provided. Cleaned and dressing applied.     Assessment & Plan:   Assessment & Plan Cervical spondylosis with left cervical and shoulder myalgia and cervicalgia   Chronic cervical spondylosis presents with left cervical and shoulder myalgia and cervicalgia. She initially experienced 80% pain relief following a previous injection, but now reports increased pain with only 40% relief. The pain is primarily on the left side, described as a half-moon pattern from the neck to the shoulder. Physical therapy has improved overall function but not significantly alleviated neck and shoulder pain. She prefers non-surgical management and is hesitant about surgery. Administer trigger point injections with Depo, bicitra, and lidocaine. Continue physical therapy focusing on neck and shoulder exercises. Discuss potential referral to a spine specialist for further evaluation and possible cervical spine-directed steroid injections if symptoms persist.

## 2024-01-04 ENCOUNTER — Ambulatory Visit

## 2024-01-04 DIAGNOSIS — M542 Cervicalgia: Secondary | ICD-10-CM

## 2024-01-04 DIAGNOSIS — M6281 Muscle weakness (generalized): Secondary | ICD-10-CM

## 2024-01-04 DIAGNOSIS — R293 Abnormal posture: Secondary | ICD-10-CM

## 2024-01-04 DIAGNOSIS — M5459 Other low back pain: Secondary | ICD-10-CM | POA: Diagnosis not present

## 2024-01-04 NOTE — Therapy (Signed)
 OUTPATIENT PHYSICAL THERAPY THORACOLUMBAR TREATMENT      Patient Name: Christy Gutierrez MRN: [id number] DOB:1940-09-11, 83 y.o., female Today's Date: 01/04/2024  END OF SESSION:  PT End of Session - 01/04/24 1109     Visit Number 32    Number of Visits 37    Date for Recertification  02/10/24    Authorization Type aetna medicare    PT Start Time 1105    PT Stop Time 1143    PT Time Calculation (min) 38 min    Activity Tolerance Patient tolerated treatment well    Behavior During Therapy WFL for tasks assessed/performed         Past Medical History:  Diagnosis Date   Atrial fibrillation (HCC)    Back pain    Hypertension    Past Surgical History:  Procedure Laterality Date   SKIN CANCER EXCISION  05/2022   on the chest   TONSILLECTOMY     TUBAL LIGATION     bilateral   Patient Active Problem List   Diagnosis Date Noted   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   Atrial fibrillation (HCC) 10/06/2022   Near syncope 09/03/2020   Pulmonary nodule, right 06/26/2019   MDD (major depressive disorder), recurrent episode 12/26/2018   Emphysema lung (HCC) 03/28/2018   Episodic lightheadedness 08/08/2017   Essential hypertension 03/05/2016   Lumbar spondylosis 04/02/2015   Left knee pain 04/02/2015   Migraine 05/08/2013   Osteoporosis 11/10/2009   Vitamin D  deficiency 11/04/2009   Hyperlipidemia 11/04/2009   Backache 11/04/2009   FATIGUE 11/04/2009   Asymptomatic postmenopausal status 11/04/2009   PCP: Alvan Craven MD REFERRING PROVIDER: Alvan Craven MD REFERRING DIAG:  Diagnosis  S32.020A (ICD-10-CM) - Compression fracture of L2 vertebra, initial encounter (HCC)   Rationale for Evaluation and Treatment: Rehabilitation  THERAPY DIAG:  Other low back pain  Muscle weakness (generalized)  Cervicalgia  Abnormal posture  ONSET DATE: 09/13/23   SUBJECTIVE:                                                                                                                                                                                           SUBJEtCTIVE STATEMENT: Patient reports she saw Sharlene who gave her another round of injections and is referring her to a spine specialist due to her continuing to have Lt neck pain. Patient states she was worn out after last PT session. Patient states she woke up today and noticed more ease of motion and less pain after injections.   EVAL: The patient reports that she had a fall on May 28 and hit her head and bumped her arm. 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 She is having significant pain that is constant in nature. She cannot get down on the floor (she would get down to pray or exercise prior to this pain) and notes difficulty trying to get up.   PERTINENT HISTORY:  Osteoporosis, H/o back pain from early 90s. A-fib, emphysema, migraine, HTN.  PAIN:  Are you having pain? Yes: NPRS scale: neck: 2/10 (Lt); can get occasional low back pain lots of physical activity  Pain location:  neck Pain description: soreness, gets to be sharp pain and muscle spasms Aggravating factors: getting up after sitting Relieving factors: heating pad,   PRECAUTIONS: Back *precautions due to acute compression fx and osteoporosis  RED FLAGS: Compression fracture: Yes: fall on May 28 with pain beginning 2 weeks post fall   Bowel and bladder: denies issues Denies saddle paresthesias  WEIGHT BEARING RESTRICTIONS: No  FALLS:  Has patient fallen in last 6 months? Yes. Number of falls 1 time in May; she describes 2-3 over the past year  LIVING ENVIRONMENT: Lives with: lives alone Lives in: House/apartment Stairs: Yes: Internal: 12 to basement, 4-5 going into home;  steps; one rail Has following equipment at home: None  PLOF: Independent  PATIENT GOALS: reduce, walk upright, return to doing exercises  OBJECTIVE:  Note: Objective measures were  completed at Evaluation unless otherwise noted.  DIAGNOSTIC FINDINGS:  IMPRESSION: 1. New mild compression deformity of L2, possibly acute. 2. Stable L1 compression deformity. 3. Moderate multilevel degenerative change.   PATIENT SURVEYS:  Modified Oswestry: 54%  COGNITION: Overall cognitive status: Within functional limits for tasks assessed     SENSATION: WFL  POSTURE: No Significant postural limitations  PALPATION: None today-- due to   LUMBAR ROM: Deferred due to recent compression fx  LOWER EXTREMITY ROM:   WFLs  LOWER EXTREMITY MMT:  deferred due to pain-- able to do SLR  MMT Right eval Left eval  Hip flexion    Hip extension    Hip abduction    Hip adduction    Hip internal rotation    Hip external rotation    Knee flexion    Knee extension    Ankle dorsiflexion    Ankle plantarflexion    Ankle inversion    Ankle eversion     (Blank rows = not tested)  LUMBAR SPECIAL TESTS:  Deferred due to compression fx  FUNCTIONAL TESTS:  Log roll for bed mobility instructed and performed  GAIT: Distance walked: 20/13.16 seconds=1.52 ft/sec Assistive device utilized: None Level of assistance: Complete Independence Comments: slowed pace Pain after walking when she first sits down  CERVICAL ROM:   Active ROM A/PROM (deg) 11/10/23 AROM 12/05/23 AROM 12/12/23 AROM 12/28/23  Flexion   32 Pain with return to midline 34 Pain on Lt side coming back up  Extension   32  33  Right lateral flexion 28 22 25 18   Left lateral flexion 10 18 18 14   Right rotation 58  40 45 35  Left rotation 30 40 36 34   (Blank rows = not tested)  Neuromuscular re-ed: Berg=44/56 Berg 12/05/23=4656   [facility] Adult PT Treatment:  DATE: 01/04/2024 Neuromuscular re-ed: Seated scapula retraction + chest lift 10 x 10 sec Standing overhead arm slides with thoracic extension SA wall slide with foam roller  Therapeutic Activity: Unilateral  reach forward at 90 degrees + opp hand behind the head x10 each side --> repeated with 1#DB Seated forward arm reaching at 90 degrees + no weight --> 1#DB palms up & palms down Prone prop on elbows --> tried adding cervical retraction but discontinued d/t pain Prone prop on elbows to tolerance Walking + 5#KB farmer carry 6 x 30'   [facility] Adult PT Treatment:                                                DATE: 01/02/2024 Neuromuscular re-ed: Standing forward arm reaching at 90 degrees + 1#WW --> no weights  Seated forward arm reaching at 90 degrees + noodle for postural awareness Unilateral reach forward at 90 degrees + opp hand behind the head Scapula retraction + chest lift 10 x10 sec hold Therapeutic Activity: Prone prop on elbows x 3 rounds Standing arm slides up wall in corner --> rib lift away from pelvis Supine on incline: Chest press 1#dowel --> palms down x10 , palms up x10   Arm raises + 1#WW (bil) -> palms down x10, palms up x10    [facility] Adult PT Treatment:                                                DATE: 12/28/2023 Therapeutic Exercise: Seated cervical stretches --> upper trap, platysma Corner pec stretch --> arms low, mid, high Cervical measurements (see above)  Neuromuscular re-ed: Standing arm slides up wall in corner --> tactile cues to promote elevating ribcage away from pelvis Rows + green TB with cues for chest lift Therapeutic Activity: Seated thoracic extension with rolled yoga mat vertical along spine Walking + navigating busy environment Seated forward reaching towards 90 degrees with 1#DB    [facility] Adult PT Treatment:                                                DATE: 12/26/2023 Therapeutic Exercise: Seated cervical flexion AROM Cervical stretches --> UT, SMC, scalenes Neuromuscular re-ed: Rolling orange PB up the wall + breathing in overhead reach Modified tandem stance balance Stride stance weight shifting forward + opp arm overhead reaching to  target Therapeutic Activity: Seated:  Trunk rotation, initiating from ribs --> hand slides on thighs Modified cat/cow variations (small cat) with hands behind head Side bend with support from orange PB Seated hip hinge --> hands on thighs Sit <--> stand with hands on thighs Walking with reciprocal arm swing & trunk rotation Standing hip abd x10 (bil)   PATIENT EDUCATION:  Education details: Updated HEP Person educated: Patient Education method: Explanation, Demonstration, and Handouts Education comprehension: verbalized understanding, returned demonstration, and needs further education  HOME EXERCISE PROGRAM: Access Code: [email or web address] URL: [email or web address] Date: 12/26/2023 Prepared by: Lamarr Price  Program Notes THESE SHOULD NOT INCREASE PAIN. If so, wait until you return to therapy.Straw breathing: Inhale through nose, exhale through straw  Exercises - Supine Active Straight Leg Raise  - 1 x daily - 5 x weekly - 1 sets - 10 reps - Beginner Bridge  - 1 x daily - 7 x weekly - 3 sets - 10 reps - Sit to Stand Without Arm Support  - 2-3 x daily - 7 x weekly - 1 sets - 5 reps - Seated Cervical Flexion AROM  - 1 x daily - 7 x weekly - 1 sets - 10 reps - Supine Cervical Rotation AROM on Pillow  - 1 x daily - 7 x weekly - 1 sets - 10 reps - Seated Isometric Cervical Sidebending  - 2 x daily - 7 x weekly - 1 sets - 10 reps - Standing Pursed Lip Breathing with Ribcage Elevation  - 2-3 x daily - 7 x weekly - 1 sets - 10 reps - Seated Thoracic Lumbar Extension with Pectoralis Stretch  - 1-2 x daily - 7 x weekly - 1 sets - 10 reps - 5-10 sec hold - Sternocleidomastoid Stretch  - 1 x daily - 7 x weekly - 3 sets - 10 reps - Seated Trunk Rotation - Hands on Shoulders  - 1 x daily - 7 x weekly - 3 sets - 10 reps - Standing Tandem Balance with Counter Support  - 1 x daily - 7 x weekly - 1 sets - 3-5 reps - 10-30 sec hold - Stride Stance Weight Shift with Opposite Arm Reach  - 1  x daily - 7 x weekly - 3 sets - 10 reps  ASSESSMENT: CLINICAL IMPRESSION: Patient is making steady progress with improved ribcage mobility and postural stability endurance. Noted improvement with thoracic extension mobility during overhead arm slides on wall. Patient reported more ease with breathing and better endurance with standing/walking activities.   EVAL: Patient is an 83  y.o. female who was seen today for physical therapy evaluation and treatment for compression fx and low back pain. Her pain varies from 5/10 up to 10/10 and is typically worse in the morning x first hour of waking, coughing, and after sitting and transitioning to stand. Evaluation was limited due to high irritability and precautions due to fx. She presents with impairments in ROM, strength, pain, and overall functional mobility. PT recommends lumbar brace due to characteristics of pain during evaluation (winces with coughing and sit<>stand)-- will reach out to request order.  GOALS: Goals reviewed with patient? Yes   UPDATED GOALS: SHORT TERM GOALS: Target date: 01/11/24  The patient will improve L cervical rotation to > or equal to 45 degrees.  Baseline:  see chart Goal status:  NEW  2. The patient will report pain < or equal to 2/10 at worst. Baseline: pain varies day to day Goal status: NEW  LONG TERM GOALS: Target date: 02/10/24   The patient will be indep with HEP progression. Baseline:  performing current HEP Goal status: UPDATED  2.  The patient will improve Berg balance scale to > or equal to 48/56. Baseline: 46/56  Goal status: UPDATED  3.  The patient will improve 5 time sit<>stand to < or equal to 16 seconds. Baseline: 19.91 seconds Goal status: UPDATED  PLAN:  PT FREQUENCY: 2x/week  PT DURATION: 4 weeks  PLANNED INTERVENTIONS: [id number]- PT Re-evaluation, 97750- Physical Performance Testing, 97110-Therapeutic exercises, 97530- Therapeutic activity, [id number]- Neuromuscular re-education, 97535-  Self Care, [id number]- Manual therapy, [id number]- Gait training, [phone]- Aquatic Therapy, Patient/Family education, Balance training, Cryotherapy, and Moist heat.  PLAN FOR NEXT SESSION: dynamic balance, overhead  reaching activities; cervical strengthening, postural strengthening, core engagement, balance training, endurance, ribcage mobility/trunk roation.   Lamarr GORMAN Price, PTA 01/04/2024, 11:44 AM

## 2024-01-09 ENCOUNTER — Encounter: Payer: Self-pay | Admitting: Rehabilitative and Restorative Service Providers"

## 2024-01-09 ENCOUNTER — Ambulatory Visit: Attending: Cardiology

## 2024-01-09 ENCOUNTER — Ambulatory Visit: Admitting: Rehabilitative and Restorative Service Providers"

## 2024-01-09 ENCOUNTER — Ambulatory Visit: Admitting: Cardiology

## 2024-01-09 ENCOUNTER — Encounter: Payer: Self-pay | Admitting: Cardiology

## 2024-01-09 VITALS — BP 110/60 | HR 100 | Ht 66.0 in | Wt 146.0 lb

## 2024-01-09 DIAGNOSIS — I4891 Unspecified atrial fibrillation: Secondary | ICD-10-CM

## 2024-01-09 DIAGNOSIS — M5459 Other low back pain: Secondary | ICD-10-CM

## 2024-01-09 DIAGNOSIS — I10 Essential (primary) hypertension: Secondary | ICD-10-CM

## 2024-01-09 DIAGNOSIS — R293 Abnormal posture: Secondary | ICD-10-CM

## 2024-01-09 DIAGNOSIS — I340 Nonrheumatic mitral (valve) insufficiency: Secondary | ICD-10-CM | POA: Diagnosis not present

## 2024-01-09 DIAGNOSIS — M6281 Muscle weakness (generalized): Secondary | ICD-10-CM

## 2024-01-09 DIAGNOSIS — M542 Cervicalgia: Secondary | ICD-10-CM

## 2024-01-09 NOTE — Therapy (Signed)
 OUTPATIENT PHYSICAL THERAPY THORACOLUMBAR TREATMENT     Patient Name: Christy Gutierrez MRN: [id number] DOB:[birth year], 83 y.o., female Today's Date: 01/09/2024  END OF SESSION:  PT End of Session - 01/09/24 1058     Visit Number 33    Number of Visits 37    Date for Recertification  02/10/24    Authorization Type aetna medicare    PT Start Time 1100    PT Stop Time 1143    PT Time Calculation (min) 43 min    Activity Tolerance Patient tolerated treatment well    Behavior During Therapy WFL for tasks assessed/performed          Past Medical History:  Diagnosis Date   Atrial fibrillation (HCC)    Back pain    Hypertension    Past Surgical History:  Procedure Laterality Date   SKIN CANCER EXCISION  05/2022   on the chest   TONSILLECTOMY     TUBAL LIGATION     bilateral   Patient Active Problem List   Diagnosis Date Noted   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   Atrial fibrillation (HCC) 10/06/2022   Near syncope 09/03/2020   Pulmonary nodule, right 06/26/2019   MDD (major depressive disorder), recurrent episode 12/26/2018   Emphysema lung (HCC) 03/28/2018   Episodic lightheadedness 08/08/2017   Essential hypertension 03/05/2016   Lumbar spondylosis 04/02/2015   Left knee pain 04/02/2015   Migraine 05/08/2013   Osteoporosis 11/10/2009   Vitamin D  deficiency 11/04/2009   Hyperlipidemia 11/04/2009   Backache 11/04/2009   FATIGUE 11/04/2009   Asymptomatic postmenopausal status 11/04/2009   PCP: Alvan Craven MD REFERRING PROVIDER: Alvan Craven MD REFERRING DIAG:  Diagnosis  S32.020A (ICD-10-CM) - Compression fracture of L2 vertebra, initial encounter (HCC)   Rationale for Evaluation and Treatment: Rehabilitation  THERAPY DIAG:  Other low back pain  Muscle weakness (generalized)  Cervicalgia  Abnormal posture  ONSET DATE: 09/13/23   SUBJECTIVE:                                                                                                                                                                                           SUBJEtCTIVE STATEMENT: The patient did not feel like the injections last week helped. She woke with HA yesterday and didn't feel well. She feels some improvement with walking and reaching tasks. She couldn't do HEP over the weekend due to pain. She arrives today with 4/10 pain in L cervical spine musculature. Monday is my worse day because she does a lot of cleaning and churcho n Sunday.  EVAL: The patient reports that she had a fall on May 28 and hit her  head and bumped her arm. 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 She is having significant pain that is constant in nature. She cannot get down on the floor (she would get down to pray or exercise prior to this pain) and notes difficulty trying to get up.   PERTINENT HISTORY:  Osteoporosis, H/o back pain from early 90s. A-fib, emphysema, migraine, HTN.  PAIN:  Are you having pain? Yes: NPRS scale: neck: 4/10 (Lt) Pain location:  neck Pain description: soreness, gets to be sharp pain and muscle spasms Aggravating factors: getting up after sitting Relieving factors: heating pad,   PRECAUTIONS: Back *precautions due to acute compression fx and osteoporosis  RED FLAGS: Compression fracture: Yes: fall on May 28 with pain beginning 2 weeks post fall   Bowel and bladder: denies issues Denies saddle paresthesias  WEIGHT BEARING RESTRICTIONS: No  FALLS:  Has patient fallen in last 6 months? Yes. Number of falls 1 time in May; she describes 2-3 over the past year  LIVING ENVIRONMENT: Lives with: lives alone Lives in: House/apartment Stairs: Yes: Internal: 12 to basement, 4-5 going into home;  steps; one rail Has following equipment at home: None  PLOF: Independent  PATIENT GOALS: reduce, walk upright, return to doing exercises  OBJECTIVE:  Note: Objective measures were completed  at Evaluation unless otherwise noted.  DIAGNOSTIC FINDINGS:  IMPRESSION: 1. New mild compression deformity of L2, possibly acute. 2. Stable L1 compression deformity. 3. Moderate multilevel degenerative change.   PATIENT SURVEYS:  Modified Oswestry: 54%  COGNITION: Overall cognitive status: Within functional limits for tasks assessed     SENSATION: WFL  POSTURE: No Significant postural limitations  PALPATION: None today-- due to   LUMBAR ROM: Deferred due to recent compression fx  LOWER EXTREMITY ROM:   WFLs  LOWER EXTREMITY MMT:  deferred due to pain-- able to do SLR  MMT Right eval Left eval  Hip flexion    Hip extension    Hip abduction    Hip adduction    Hip internal rotation    Hip external rotation    Knee flexion    Knee extension    Ankle dorsiflexion    Ankle plantarflexion    Ankle inversion    Ankle eversion     (Blank rows = not tested)  LUMBAR SPECIAL TESTS:  Deferred due to compression fx  FUNCTIONAL TESTS:  Log roll for bed mobility instructed and performed  GAIT: Distance walked: 20/13.16 seconds=1.52 ft/sec Assistive device utilized: None Level of assistance: Complete Independence Comments: slowed pace Pain after walking when she first sits down  CERVICAL ROM:   Active ROM A/PROM (deg) 11/10/23 AROM 12/05/23 AROM 12/12/23 AROM 12/28/23 AROM 01/09/24  Flexion   32 Pain with return to midline 34 Pain on Lt side coming back up   Extension   32  33   Right lateral flexion 28 22 25 18    Left lateral flexion 10 18 18 14    Right rotation 58  40 45 35 43  Left rotation 30 40 36 34 24 *painful today   (Blank rows = not tested)  Neuromuscular re-ed: Berg=44/56 Berg 12/05/23=4656  [facility] Adult PT Treatment:  DATE: 01/09/24 Therapeutic Exercise: AROM neck-- see chart above Supine neck isometrics lateral flexion R nad L x 3 reps x 5 second holds Manual Therapy: STM cervical musculature  supine PROM neck in supine Neuromuscular re-ed: Postural re-education sitting on physiodisc working on lumbar pelvic tilt anteriorly to align spine and then doing ER and alternating reaching Sitting on physiodisc working on increasing length between ribs and pelvis Serratus wall slide on foam roller x 10 reps Seated scapular retraction  while on physiodisc Therapeutic Activity: Anatomical wall press with palms out and scapular activation x 10 reps Standing forward reach with yellow band taught to engage serratus bilat x 10 reps Gait: Gait activities working on arm swing with gait x 200 ft  Resisted gait pressing into an unweighted sled to engage core and gluts x 60 ft   [facility] Adult PT Treatment:                                                DATE: 01/04/2024 Neuromuscular re-ed: Seated scapula retraction + chest lift 10 x 10 sec Standing overhead arm slides with thoracic extension SA wall slide with foam roller  Therapeutic Activity: Unilateral reach forward at 90 degrees + opp hand behind the head x10 each side --> repeated with 1#DB Seated forward arm reaching at 90 degrees + no weight --> 1#DB palms up & palms down Prone prop on elbows --> tried adding cervical retraction but discontinued d/t pain Prone prop on elbows to tolerance Walking + 5#KB farmer carry 6 x 30'   [facility] Adult PT Treatment:                                                DATE: 01/02/2024 Neuromuscular re-ed: Standing forward arm reaching at 90 degrees + 1#WW --> no weights  Seated forward arm reaching at 90 degrees + noodle for postural awareness Unilateral reach forward at 90 degrees + opp hand behind the head Scapula retraction + chest lift 10 x10 sec hold Therapeutic Activity: Prone prop on elbows x 3 rounds Standing arm slides up wall in corner --> rib lift away from pelvis Supine on incline: Chest press 1#dowel --> palms down x10 , palms up x10   Arm raises + 1#WW (bil) -> palms down x10, palms up x10     [facility] Adult PT Treatment:                                                DATE: 12/28/2023 Therapeutic Exercise: Seated cervical stretches --> upper trap, platysma Corner pec stretch --> arms low, mid, high Cervical measurements (see above)  Neuromuscular re-ed: Standing arm slides up wall in corner --> tactile cues to promote elevating ribcage away from pelvis Rows + green TB with cues for chest lift Therapeutic Activity: Seated thoracic extension with rolled yoga mat vertical along spine Walking + navigating busy environment Seated forward reaching towards 90 degrees with 1#DB   PATIENT EDUCATION:  Education details: Updated HEP Person educated: Patient Education method: Explanation, Demonstration, and Handouts Education comprehension: verbalized understanding, returned demonstration, and needs further education  HOME EXERCISE PROGRAM: Access Code: [email or web address] URL: [email or web address] Date: 12/26/2023 Prepared by: Lamarr Price  Program Notes THESE SHOULD NOT INCREASE PAIN. If so, wait until you return to therapy.Straw breathing: Inhale through nose, exhale through straw  Exercises - Supine Active Straight Leg Raise  - 1 x daily - 5 x weekly - 1 sets - 10 reps - Beginner Bridge  - 1 x daily - 7 x weekly - 3 sets - 10 reps - Sit to Stand Without Arm Support  - 2-3 x daily - 7 x weekly - 1 sets - 5 reps - Seated Cervical Flexion AROM  - 1 x daily - 7 x weekly - 1 sets - 10 reps - Supine Cervical Rotation AROM on Pillow  - 1 x daily - 7 x weekly - 1 sets - 10 reps - Seated Isometric Cervical Sidebending  - 2 x daily - 7 x weekly - 1 sets - 10 reps - Standing Pursed Lip Breathing with Ribcage Elevation  - 2-3 x daily - 7 x weekly - 1 sets - 10 reps - Seated Thoracic Lumbar Extension with Pectoralis Stretch  - 1-2 x daily - 7 x weekly - 1 sets - 10 reps - 5-10 sec hold - Sternocleidomastoid Stretch  - 1 x daily - 7 x weekly - 3 sets - 10 reps - Seated Trunk  Rotation - Hands on Shoulders  - 1 x daily - 7 x weekly - 3 sets - 10 reps - Standing Tandem Balance with Counter Support  - 1 x daily - 7 x weekly - 1 sets - 3-5 reps - 10-30 sec hold - Stride Stance Weight Shift with Opposite Arm Reach  - 1 x daily - 7 x weekly - 3 sets - 10 reps  ASSESSMENT: CLINICAL IMPRESSION: The patient is continuing with neck pain and decreased ROM. She is improving with gait distance, functional strength, and ADL performance. PT anticipates being able to continue working for 2 more weeks on HEP, strengthening. Then may hold visits until patient is seen by spine specialist for her neck.   EVAL: Patient is an 83  y.o. female who was seen today for physical therapy evaluation and treatment for compression fx and low back pain. Her pain varies from 5/10 up to 10/10 and is typically worse in the morning x first hour of waking, coughing, and after sitting and transitioning to stand. Evaluation was limited due to high irritability and precautions due to fx. She presents with impairments in ROM, strength, pain, and overall functional mobility. PT recommends lumbar brace due to characteristics of pain during evaluation (winces with coughing and sit<>stand)-- will reach out to request order.  GOALS: Goals reviewed with patient? Yes   UPDATED GOALS: SHORT TERM GOALS: Target date: 01/11/24  The patient will improve L cervical rotation to > or equal to 45 degrees.  Baseline:  see chart Goal status:  NOT MET   2. The patient will report pain < or equal to 2/10 at worst. Baseline: pain varies day to day Goal status: NOT MET-- patient continuing with pain  LONG TERM GOALS: Target date: 02/10/24   The patient will be indep with HEP progression. Baseline:  performing current HEP Goal status: UPDATED  2.  The patient will improve Berg balance scale to > or equal to 48/56. Baseline: 46/56  Goal status: UPDATED  3.  The patient will improve 5 time sit<>stand to < or equal to  16 seconds. Baseline: 19.91 seconds Goal status: UPDATED  PLAN:  PT FREQUENCY: 2x/week  PT DURATION: 4 weeks  PLANNED INTERVENTIONS: [id number]- PT Re-evaluation, 97750- Physical Performance Testing, 97110-Therapeutic exercises, 97530- Therapeutic activity, [id number]- Neuromuscular re-education, 97535- Self Care, 02859- Manual therapy, [id number]- Gait training, [phone]- Aquatic Therapy, Patient/Family education, Balance training, Cryotherapy, and Moist heat.  PLAN FOR NEXT SESSION: dynamic balance, overhead reaching activities; cervical strengthening, postural strengthening, core engagement, balance training, endurance, ribcage mobility/trunk roation.  *Plan to put on hold at end of scheduled visits 11/26 until after spine specialist.   Christy Gutierrez, PT 01/09/2024, 11:03 AM

## 2024-01-09 NOTE — Progress Notes (Unsigned)
 Enrolled for [employer] to mail a ZIO XT long term holter monitor to the patients address on file.

## 2024-01-09 NOTE — Patient Instructions (Addendum)
 Testing/Procedures:   Your physician has requested that you have an echocardiogram. Echocardiography is a painless test that uses sound waves to create images of your heart. It provides your doctor with information about the size and shape of your heart and how well your heart's chambers and valves are working. This procedure takes approximately one hour. There are no restrictions for this procedure. Please do NOT wear cologne, perfume, aftershave, or lotions (deodorant is allowed). Please arrive 15 minutes prior to your appointment time.  Please note: We ask at that you not bring children with you during ultrasound (echo/ vascular) testing. Due to room size and safety concerns, children are not allowed in the ultrasound rooms during exams. Our front office staff cannot provide observation of children in our lobby area while testing is being conducted. An adult accompanying a patient to their appointment will only be allowed in the ultrasound room at the discretion of the ultrasound technician under special circumstances. We apologize for any inconvenience. [facility] ST FLOOR IMAGING DEPARTMENT  ZIO XT- Long Term Monitor Instructions  Your physician has requested you wear a ZIO patch monitor for 3 days.  This is a single patch monitor. [employer] supplies one patch monitor per enrollment. Additional stickers are not available. Please do not apply patch if you will be having a Nuclear Stress Test,  Echocardiogram, Cardiac CT, MRI, or Chest Xray during the period you would be wearing the  monitor. The patch cannot be worn during these tests. You cannot remove and re-apply the  ZIO XT patch monitor.  Your ZIO patch monitor will be mailed 3 day USPS to your address on file. It may take 3-5 days  to receive your monitor after you have been enrolled.  Once you have received your monitor, please review the enclosed instructions. Your monitor  has already been registered assigning a  specific monitor serial # to you.  Billing and Patient Assistance Program Information  We have supplied [employer] with any of your insurance information on file for billing purposes. [employer] offers a sliding scale Patient Assistance Program for patients that do not have  insurance, or whose insurance does not completely cover the cost of the ZIO monitor.  You must apply for the Patient Assistance Program to qualify for this discounted rate.  To apply, please call [employer] at [phone], select option 4, select option 2, ask to apply for  Patient Assistance Program. Meredeth will ask your household income, and how many people  are in your household. They will quote your out-of-pocket cost based on that information.  [employer] will also be able to set up a 12-month, interest-free payment plan if needed.  Applying the monitor   Shave hair from upper left chest.  Hold abrader disc by orange tab. Rub abrader in 40 strokes over the upper left chest as  indicated in your monitor instructions.  Clean area with 4 enclosed alcohol pads. Let dry.  Apply patch as indicated in monitor instructions. Patch will be placed under collarbone on left  side of chest with arrow pointing upward.  Rub patch adhesive wings for 2 minutes. Remove white label marked 1. Remove the white  label marked 2. Rub patch adhesive wings for 2 additional minutes.  While looking in a mirror, press and release button in center of patch. A small green light will  flash 3-4 times. This will be your only indicator that the monitor has been turned on.  Do not shower for the first 24  hours. You may shower after the first 24 hours.  Press the button if you feel a symptom. You will hear a small click. Record Date, Time and  Symptom in the Patient Logbook.  When you are ready to remove the patch, follow instructions on the last 2 pages of Patient  Logbook. Stick patch monitor onto the last page of Patient Logbook.  Place Patient  Logbook in the blue and white box. Use locking tab on box and tape box closed  securely. The blue and white box has prepaid postage on it. Please place it in the mailbox as  soon as possible. Your physician should have your test results approximately 7 days after the  monitor has been mailed back to [facility].  Call [facility] Customer Care at [phone] if you have questions regarding  your ZIO XT patch monitor. Call them immediately if you see an orange light blinking on your  monitor.  If your monitor falls off in less than 4 days, contact our Monitor department at [phone].  If your monitor becomes loose or falls off after 4 days call [employer] at [phone] for  suggestions on securing your monitor   Follow-Up: At [facility], you and your health needs are our priority.  As part of our continuing mission to provide you with exceptional heart care, our providers are all part of one team.  This team includes your primary Cardiologist (physician) and Advanced Practice Providers or APPs (Physician Assistants and Nurse Practitioners) who all work together to provide you with the care you need, when you need it.  Your next appointment:   6 month(s)  Provider:   Redell Shallow MD

## 2024-01-12 ENCOUNTER — Ambulatory Visit: Admitting: Rehabilitative and Restorative Service Providers"

## 2024-01-12 ENCOUNTER — Encounter: Payer: Self-pay | Admitting: Rehabilitative and Restorative Service Providers"

## 2024-01-12 DIAGNOSIS — M5459 Other low back pain: Secondary | ICD-10-CM | POA: Diagnosis not present

## 2024-01-12 DIAGNOSIS — M6281 Muscle weakness (generalized): Secondary | ICD-10-CM

## 2024-01-12 DIAGNOSIS — M542 Cervicalgia: Secondary | ICD-10-CM

## 2024-01-12 DIAGNOSIS — R293 Abnormal posture: Secondary | ICD-10-CM

## 2024-01-12 NOTE — Therapy (Signed)
 OUTPATIENT PHYSICAL THERAPY THORACOLUMBAR TREATMENT     Patient Name: Christy Gutierrez MRN: [id number] DOB:1940/09/26, 83 y.o., female Today's Date: 01/12/2024  END OF SESSION:  PT End of Session - 01/12/24 1106     Visit Number 34    Number of Visits 37    Date for Recertification  02/10/24    Authorization Type aetna medicare    Progress Note Due on Visit 40    PT Start Time 1106    PT Stop Time 1145    PT Time Calculation (min) 39 min    Activity Tolerance Patient tolerated treatment well    Behavior During Therapy WFL for tasks assessed/performed          Past Medical History:  Diagnosis Date   Atrial fibrillation (HCC)    Back pain    Hypertension    Past Surgical History:  Procedure Laterality Date   SKIN CANCER EXCISION  05/2022   on the chest   TONSILLECTOMY     TUBAL LIGATION     bilateral   Patient Active Problem List   Diagnosis Date Noted   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   Atrial fibrillation (HCC) 10/06/2022   Near syncope 09/03/2020   Pulmonary nodule, right 06/26/2019   MDD (major depressive disorder), recurrent episode 12/26/2018   Emphysema lung (HCC) 03/28/2018   Episodic lightheadedness 08/08/2017   Essential hypertension 03/05/2016   Lumbar spondylosis 04/02/2015   Left knee pain 04/02/2015   Migraine 05/08/2013   Osteoporosis 11/10/2009   Vitamin D  deficiency 11/04/2009   Hyperlipidemia 11/04/2009   Backache 11/04/2009   FATIGUE 11/04/2009   Asymptomatic postmenopausal status 11/04/2009   PCP: Alvan Craven MD REFERRING PROVIDER: Alvan Craven MD REFERRING DIAG:  Diagnosis  S32.020A (ICD-10-CM) - Compression fracture of L2 vertebra, initial encounter (HCC)   Rationale for Evaluation and Treatment: Rehabilitation  THERAPY DIAG:  Other low back pain  Muscle weakness (generalized)  Cervicalgia  Abnormal posture  ONSET DATE: 09/13/23   SUBJECTIVE:                                                                                                                                                                                           SUBJEtCTIVE STATEMENT: The patient got scheduled for spine specialist ([name]) in December. She is feeling better than earlier this week b/c she gets pain after the weekends. She began carrying wood in each day for her wood stove. Her daughter helped her figure out a system to make it work.  She notes feeling comfortable in the house now with all walking and heavier chores near the house (blowing leaves on level ground), however  walking down hill, uphill, and on unlevel surfaces is still challenging.   EVAL: The patient reports that she had a fall on May 28 and hit her head and bumped her arm. 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 She is having significant pain that is constant in nature. She cannot get down on the floor (she would get down to pray or exercise prior to this pain) and notes difficulty trying to get up.   PERTINENT HISTORY:  Osteoporosis, H/o back pain from early 90s. A-fib, emphysema, migraine, HTN.  PAIN:  Are you having pain? Yes: NPRS scale: neck: 2/10 (Lt) Pain location:  neck Pain description: soreness, gets to be sharp pain and muscle spasms Aggravating factors: getting up after sitting Relieving factors: heating pad,   PRECAUTIONS: Back *precautions due to acute compression fx and osteoporosis  RED FLAGS: Compression fracture: Yes: fall on May 28 with pain beginning 2 weeks post fall   Bowel and bladder: denies issues Denies saddle paresthesias  WEIGHT BEARING RESTRICTIONS: No  FALLS:  Has patient fallen in last 6 months? Yes. Number of falls 1 time in May; she describes 2-3 over the past year  LIVING ENVIRONMENT: Lives with: lives alone Lives in: House/apartment Stairs: Yes: Internal: 12 to basement, 4-5 going into home;  steps; one rail Has following equipment at  home: None  PLOF: Independent  PATIENT GOALS: reduce, walk upright, return to doing exercises  OBJECTIVE:  Note: Objective measures were completed at Evaluation unless otherwise noted.  DIAGNOSTIC FINDINGS:  IMPRESSION: 1. New mild compression deformity of L2, possibly acute. 2. Stable L1 compression deformity. 3. Moderate multilevel degenerative change.   PATIENT SURVEYS:  Modified Oswestry: 54%  COGNITION: Overall cognitive status: Within functional limits for tasks assessed     SENSATION: WFL  POSTURE: No Significant postural limitations  PALPATION: None today-- due to   LUMBAR ROM: Deferred due to recent compression fx  LOWER EXTREMITY ROM:   WFLs  LOWER EXTREMITY MMT:  deferred due to pain-- able to do SLR   LUMBAR SPECIAL TESTS:  Deferred due to compression fx  FUNCTIONAL TESTS:  Log roll for bed mobility instructed and performed  GAIT: Distance walked: 20/13.16 seconds=1.52 ft/sec Assistive device utilized: None Level of assistance: Complete Independence Comments: slowed pace Pain after walking when she first sits down  CERVICAL ROM:   Active ROM A/PROM (deg) 11/10/23 AROM 12/05/23 AROM 12/12/23 AROM 12/28/23 AROM 01/09/24  Flexion   32 Pain with return to midline 34 Pain on Lt side coming back up   Extension   32  33   Right lateral flexion 28 22 25 18    Left lateral flexion 10 18 18 14    Right rotation 58  40 45 35 43  Left rotation 30 40 36 34 24 *painful today   (Blank rows = not tested)  Neuromuscular re-ed: Berg=44/56 Berg 12/05/23=4656    [facility] Adult PT Treatment:                                                DATE: 01/12/24 Neuromuscular re-ed: Seated on physiodisc working on lumbar pelvic tilt into anterior tilt with spinal alignment, then adding UE reaching, then 1# weights for reaching tasks while maintaining posture Berg=49/56 Therapeutic Activity: Standing theraband lat  pulldown with feet in stride 10 reps x 2 sets Standing  shoulder extension with core engagement and breathing cues x 10 reps Standing overhead reach with cues for posture and core engagement Yoga block anterior to overhead with sit<>stand working on trunk lengthening and core engagement Gait: Gait activities with cues for core engagement, anterior pelvic tilt cues to reduce x 200 ft Patient continues to fatigue with gait and demonstrates a scapular retraction with more mechanical arm swing worsening with fatigue   [facility] PT Assessment - 01/12/24 0001       Standardized Balance Assessment   Standardized Balance Assessment Berg Balance Test      Berg Balance Test   Sit to Stand Able to stand without using hands and stabilize independently    Standing Unsupported Able to stand safely 2 minutes    Sitting with Back Unsupported but Feet Supported on Floor or Stool Able to sit safely and securely 2 minutes    Stand to Sit Sits safely with minimal use of hands    Transfers Able to transfer safely, minor use of hands    Standing Unsupported with Eyes Closed Able to stand 10 seconds safely    Standing Unsupported with Feet Together Able to place feet together independently and stand 1 minute safely    From Standing, Reach Forward with Outstretched Arm Can reach forward >12 cm safely (5)    From Standing Position, Pick up Object from Floor Able to pick up shoe safely and easily    From Standing Position, Turn to Look Behind Over each Shoulder Looks behind from both sides and weight shifts well    Turn 360 Degrees Able to turn 360 degrees safely in 4 seconds or less    Standing Unsupported, Alternately Place Feet on Step/Stool Able to complete 4 steps without aid or supervision    Standing Unsupported, One Foot in Front Able to plae foot ahead of the other independently and hold 30 seconds    Standing on One Leg Tries to lift leg/unable to hold 3 seconds but remains standing independently    Total Score 49    Berg comment: 49/56           [facility]  Adult PT Treatment:                                                DATE: 01/09/24 Therapeutic Exercise: AROM neck-- see chart above Supine neck isometrics lateral flexion R nad L x 3 reps x 5 second holds Manual Therapy: STM cervical musculature supine PROM neck in supine Neuromuscular re-ed: Postural re-education sitting on physiodisc working on lumbar pelvic tilt anteriorly to align spine and then doing ER and alternating reaching Sitting on physiodisc working on increasing length between ribs and pelvis Serratus wall slide on foam roller x 10 reps Seated scapular retraction  while on physiodisc Therapeutic Activity: Anatomical wall press with palms out and scapular activation x 10 reps Standing forward reach with yellow band taught to engage serratus bilat x 10 reps Gait: Gait activities working on arm swing with gait x 200 ft  Resisted gait pressing into an unweighted sled to engage core and gluts x 60 ft   [facility] Adult PT Treatment:  DATE: 01/04/2024 Neuromuscular re-ed: Seated scapula retraction + chest lift 10 x 10 sec Standing overhead arm slides with thoracic extension SA wall slide with foam roller  Therapeutic Activity: Unilateral reach forward at 90 degrees + opp hand behind the head x10 each side --> repeated with 1#DB Seated forward arm reaching at 90 degrees + no weight --> 1#DB palms up & palms down Prone prop on elbows --> tried adding cervical retraction but discontinued d/t pain Prone prop on elbows to tolerance Walking + 5#KB farmer carry 6 x 30'   [facility] Adult PT Treatment:                                                DATE: 01/02/2024 Neuromuscular re-ed: Standing forward arm reaching at 90 degrees + 1#WW --> no weights  Seated forward arm reaching at 90 degrees + noodle for postural awareness Unilateral reach forward at 90 degrees + opp hand behind the head Scapula retraction + chest lift 10 x10 sec  hold Therapeutic Activity: Prone prop on elbows x 3 rounds Standing arm slides up wall in corner --> rib lift away from pelvis Supine on incline: Chest press 1#dowel --> palms down x10 , palms up x10   Arm raises + 1#WW (bil) -> palms down x10, palms up x10    [facility] Adult PT Treatment:                                                DATE: 12/28/2023 Therapeutic Exercise: Seated cervical stretches --> upper trap, platysma Corner pec stretch --> arms low, mid, high Cervical measurements (see above)  Neuromuscular re-ed: Standing arm slides up wall in corner --> tactile cues to promote elevating ribcage away from pelvis Rows + green TB with cues for chest lift Therapeutic Activity: Seated thoracic extension with rolled yoga mat vertical along spine Walking + navigating busy environment Seated forward reaching towards 90 degrees with 1#DB   PATIENT EDUCATION:  Education details: Updated HEP Person educated: Patient Education method: Explanation, Demonstration, and Handouts Education comprehension: verbalized understanding, returned demonstration, and needs further education  HOME EXERCISE PROGRAM: Access Code: [email or web address] URL: [email or web address] Date: 12/26/2023 Prepared by: Lamarr Price  Program Notes THESE SHOULD NOT INCREASE PAIN. If so, wait until you return to therapy.Straw breathing: Inhale through nose, exhale through straw  Exercises - Supine Active Straight Leg Raise  - 1 x daily - 5 x weekly - 1 sets - 10 reps - Beginner Bridge  - 1 x daily - 7 x weekly - 3 sets - 10 reps - Sit to Stand Without Arm Support  - 2-3 x daily - 7 x weekly - 1 sets - 5 reps - Seated Cervical Flexion AROM  - 1 x daily - 7 x weekly - 1 sets - 10 reps - Supine Cervical Rotation AROM on Pillow  - 1 x daily - 7 x weekly - 1 sets - 10 reps - Seated Isometric Cervical Sidebending  - 2 x daily - 7 x weekly - 1 sets - 10 reps - Standing Pursed Lip Breathing with Ribcage Elevation  -  2-3 x daily - 7 x weekly - 1 sets - 10 reps - Seated Thoracic Lumbar Extension with Pectoralis  Stretch  - 1-2 x daily - 7 x weekly - 1 sets - 10 reps - 5-10 sec hold - Sternocleidomastoid Stretch  - 1 x daily - 7 x weekly - 3 sets - 10 reps - Seated Trunk Rotation - Hands on Shoulders  - 1 x daily - 7 x weekly - 3 sets - 10 reps - Standing Tandem Balance with Counter Support  - 1 x daily - 7 x weekly - 1 sets - 3-5 reps - 10-30 sec hold - Stride Stance Weight Shift with Opposite Arm Reach  - 1 x daily - 7 x weekly - 3 sets - 10 reps  ASSESSMENT: CLINICAL IMPRESSION: The patient's neck pain is back to her baseline with limited mobility. She is feeling increased strength and endurance for ADL and IADL performance and has returned to tasks including leaf blowing, and bringing wood in for her wood stove (with some modifications). She is continuing to progress with endurance and strength. PT planning to continue to work on current program emphasizing breath support, endurance, core strength, LE strength. Patient to d/c at end of current scheduled visits and will follow up with orthopedic spine specialist due to continued neck pain.   EVAL: Patient is an 83  y.o. female who was seen today for physical therapy evaluation and treatment for compression fx and low back pain. Her pain varies from 5/10 up to 10/10 and is typically worse in the morning x first hour of waking, coughing, and after sitting and transitioning to stand. Evaluation was limited due to high irritability and precautions due to fx. She presents with impairments in ROM, strength, pain, and overall functional mobility. PT recommends lumbar brace due to characteristics of pain during evaluation (winces with coughing and sit<>stand)-- will reach out to request order.  GOALS: Goals reviewed with patient? Yes   UPDATED GOALS: SHORT TERM GOALS: Target date: 01/11/24  The patient will improve L cervical rotation to > or equal to 45 degrees.   Baseline:  see chart Goal status:  NOT MET   2. The patient will report pain < or equal to 2/10 at worst. Baseline: pain varies day to day Goal status: NOT MET-- patient continuing with pain  LONG TERM GOALS: Target date: 02/10/24   The patient will be indep with HEP progression. Baseline:  performing current HEP Goal status: UPDATED  2.  The patient will improve Berg balance scale to > or equal to 48/56. Baseline: 46/56  Goal status: MET on 01/12/24  3.  The patient will improve 5 time sit<>stand to < or equal to 16 seconds. Baseline: 19.91 seconds Goal status: UPDATED  PLAN:  PT FREQUENCY: 2x/week  PT DURATION: 4 weeks  PLANNED INTERVENTIONS: [id number]- PT Re-evaluation, 97750- Physical Performance Testing, 97110-Therapeutic exercises, 97530- Therapeutic activity, [id number]- Neuromuscular re-education, 97535- Self Care, [id number]- Manual therapy, [id number]- Gait training, [id number]- Aquatic Therapy, Patient/Family education, Balance training, Cryotherapy, and Moist heat.  PLAN FOR NEXT SESSION: dynamic balance, overhead reaching activities; cervical strengthening, postural strengthening, core engagement, balance training, endurance, ribcage mobility/trunk roation.  *Plan to put on hold (or d/c) at end of scheduled visits 11/26 until after spine specialist.   Ione Sandusky, PT 01/12/2024, 11:06 AM

## 2024-01-16 ENCOUNTER — Ambulatory Visit: Admitting: Rehabilitative and Restorative Service Providers"

## 2024-01-16 DIAGNOSIS — R293 Abnormal posture: Secondary | ICD-10-CM

## 2024-01-16 DIAGNOSIS — M542 Cervicalgia: Secondary | ICD-10-CM

## 2024-01-16 DIAGNOSIS — M5459 Other low back pain: Secondary | ICD-10-CM | POA: Diagnosis not present

## 2024-01-16 DIAGNOSIS — M6281 Muscle weakness (generalized): Secondary | ICD-10-CM

## 2024-01-16 NOTE — Therapy (Signed)
 OUTPATIENT PHYSICAL THERAPY THORACOLUMBAR TREATMENT     Patient Name: Christy Gutierrez MRN: [id number] DOB:Apr 25, 1940, 83 y.o., female Today's Date: 01/16/2024  END OF SESSION:  PT End of Session - 01/16/24 1056     Visit Number 35    Number of Visits 37    Date for Recertification  02/10/24    Authorization Type [employer] medicare    Progress Note Due on Visit 40    PT Start Time 1057    PT Stop Time 1140    PT Time Calculation (min) 43 min    Activity Tolerance Patient tolerated treatment well    Behavior During Therapy WFL for tasks assessed/performed          Past Medical History:  Diagnosis Date   Atrial fibrillation (HCC)    Back pain    Hypertension    Past Surgical History:  Procedure Laterality Date   SKIN CANCER EXCISION  05/2022   on the chest   TONSILLECTOMY     TUBAL LIGATION     bilateral   Patient Active Problem List   Diagnosis Date Noted   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   Atrial fibrillation (HCC) 10/06/2022   Near syncope 09/03/2020   Pulmonary nodule, right 06/26/2019   MDD (major depressive disorder), recurrent episode 12/26/2018   Emphysema lung (HCC) 03/28/2018   Episodic lightheadedness 08/08/2017   Essential hypertension 03/05/2016   Lumbar spondylosis 04/02/2015   Left knee pain 04/02/2015   Migraine 05/08/2013   Osteoporosis 11/10/2009   Vitamin D  deficiency 11/04/2009   Hyperlipidemia 11/04/2009   Backache 11/04/2009   FATIGUE 11/04/2009   Asymptomatic postmenopausal status 11/04/2009   PCP: Alvan Craven MD REFERRING PROVIDER: Alvan Craven MD REFERRING DIAG:  Diagnosis  S32.020A (ICD-10-CM) - Compression fracture of L2 vertebra, initial encounter (HCC)   Rationale for Evaluation and Treatment: Rehabilitation  THERAPY DIAG:  Other low back pain  Muscle weakness (generalized)  Cervicalgia  Abnormal posture  ONSET DATE: 09/13/23   SUBJECTIVE:                                                                                                                                                                                           SUBJEtCTIVE STATEMENT: The patient reports she hurt Saturday and Sunday after picking up tree limbs for about an hour (with frequent rests). She noticed her back and her neck were both painful. Her grandson cut limbs away from the house and she picked them up. Pain is reduced today, but she continues with soreness (in legs, neck, and back). She is having to bend a lot to get wood stove up and running for the  season. She pulls the grate out and has to carry it, which is heavy.  The patient reports when she left on Wednesday that was the best she has felt in a long time.   EVAL: The patient reports that she had a fall on May 28 and hit her head and bumped her arm. 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 She is having significant pain that is constant in nature. She cannot get down on the floor (she would get down to pray or exercise prior to this pain) and notes difficulty trying to get up.   PERTINENT HISTORY:  Osteoporosis, H/o back pain from early 90s. A-fib, emphysema, migraine, HTN.  PAIN:  Are you having pain? Yes: NPRS scale: neck: 4/10 (Lt) Pain location:  neck Pain description: soreness, gets to be sharp pain and muscle spasms Aggravating factors: getting up after sitting Relieving factors: heating pad,   PRECAUTIONS: Back *precautions due to acute compression fx and osteoporosis  RED FLAGS: Compression fracture: Yes: fall on May 28 with pain beginning 2 weeks post fall   Bowel and bladder: denies issues Denies saddle paresthesias  WEIGHT BEARING RESTRICTIONS: No  FALLS:  Has patient fallen in last 6 months? Yes. Number of falls 1 time in May; she describes 2-3 over the past year  LIVING ENVIRONMENT: Lives with: lives alone Lives in: House/apartment Stairs: Yes: Internal: 12 to basement,  4-5 going into home;  steps; one rail Has following equipment at home: None  PLOF: Independent  PATIENT GOALS: reduce, walk upright, return to doing exercises  OBJECTIVE:  Note: Objective measures were completed at Evaluation unless otherwise noted.  DIAGNOSTIC FINDINGS:  IMPRESSION: 1. New mild compression deformity of L2, possibly acute. 2. Stable L1 compression deformity. 3. Moderate multilevel degenerative change.   PATIENT SURVEYS:  Modified Oswestry: 54%  COGNITION: Overall cognitive status: Within functional limits for tasks assessed     SENSATION: WFL  POSTURE: No Significant postural limitations  PALPATION: None today-- due to   LUMBAR ROM: Deferred due to recent compression fx  LOWER EXTREMITY ROM:   WFLs  LOWER EXTREMITY MMT:  deferred due to pain-- able to do SLR   LUMBAR SPECIAL TESTS:  Deferred due to compression fx  FUNCTIONAL TESTS:  Log roll for bed mobility instructed and performed  GAIT: Distance walked: 20/13.16 seconds=1.52 ft/sec Assistive device utilized: None Level of assistance: Complete Independence Comments: slowed pace Pain after walking when she first sits down  CERVICAL ROM:  Active ROM A/PROM (deg) 11/10/23 AROM 12/05/23 AROM 12/12/23 AROM 12/28/23 AROM 01/09/24  Flexion   32 Pain with return to midline 34 Pain on Lt side coming back up   Extension   32  33   Right lateral flexion 28 22 25 18    Left lateral flexion 10 18 18 14    Right rotation 58  40 45 35 43  Left rotation 30 40 36 34 24 *painful today   (Blank rows = not tested)  Neuromuscular re-ed: Berg=44/56 Berg 12/05/23=4656   [facility] Adult PT Treatment:                                                DATE: 01/16/24 Therapeutic Exercise: Warm up with UBE for UE endurance and standing tolerance 2 min  forward at level 1 and 30 seconds backwards.  Sidelying cervical lateral flexion x 5 reps Sidelying lat flexion stretch over pool noodle within  tolerance Neuromuscular re-ed: Posture re-education with yellow band anchored superior and posterior to patient with pulling down from overhead in front of her x 8 reps with cues Yellow band with pull downs shoulder extension with postural cues for lengthening Seated on physiodisc working on lumbar pelvic tilt x 10 reps with breathing cues Therapeutic Activity: Gentle rocking lumbar rotation adding neck contralateral rotation Chest opening stretch supine Sit<>stand with ball overhead reaching tapping into breath support with cues Farmer's carry 5# KB in right hand, then left x 160 ft each Palloff press yellow band R and L x 8 reps   [facility] Adult PT Treatment:                                                DATE: 01/12/24 Neuromuscular re-ed: Seated on physiodisc working on lumbar pelvic tilt into anterior tilt with spinal alignment, then adding UE reaching, then 1# weights for reaching tasks while maintaining posture Berg=49/56 Therapeutic Activity: Standing theraband lat pulldown with feet in stride 10 reps x 2 sets Standing shoulder extension with core engagement and breathing cues x 10 reps Standing overhead reach with cues for posture and core engagement Yoga block anterior to overhead with sit<>stand working on trunk lengthening and core engagement Gait: Gait activities with cues for core engagement, anterior pelvic tilt cues to reduce x 200 ft Patient continues to fatigue with gait and demonstrates a scapular retraction with more mechanical arm swing worsening with fatigue    [facility] Adult PT Treatment:                                                DATE: 01/09/24 Therapeutic Exercise: AROM neck-- see chart above Supine neck isometrics lateral flexion R nad L x 3 reps x 5 second holds Manual Therapy: STM cervical musculature supine PROM neck in supine Neuromuscular re-ed: Postural re-education sitting on physiodisc working on lumbar pelvic tilt anteriorly to align spine and then  doing ER and alternating reaching Sitting on physiodisc working on increasing length between ribs and pelvis Serratus wall slide on foam roller x 10 reps Seated scapular retraction  while on physiodisc Therapeutic Activity: Anatomical wall press with palms out and scapular activation x 10 reps Standing forward reach with yellow band taught to engage serratus bilat x 10 reps Gait: Gait activities working on arm swing with gait x 200 ft  Resisted gait pressing into an unweighted sled to engage core and gluts x 60 ft   [facility] Adult PT Treatment:                                                DATE: 01/04/2024 Neuromuscular re-ed: Seated scapula retraction + chest lift 10 x 10 sec Standing overhead arm slides with thoracic extension SA wall slide with foam roller  Therapeutic Activity: Unilateral reach forward at 90 degrees + opp hand behind the head x10 each side --> repeated with 1#DB Seated forward arm reaching  at 90 degrees + no weight --> 1#DB palms up & palms down Prone prop on elbows --> tried adding cervical retraction but discontinued d/t pain Prone prop on elbows to tolerance Walking + 5#KB farmer carry 6 x 30'   PATIENT EDUCATION:  Education details: Updated HEP Person educated: Patient Education method: Explanation, Demonstration, and Handouts Education comprehension: verbalized understanding, returned demonstration, and needs further education  HOME EXERCISE PROGRAM: Access Code: [email or web address] URL: [email or web address] Date: 12/26/2023 Prepared by: Lamarr Price  Exercises - Supine Active Straight Leg Raise  - 1 x daily - 5 x weekly - 1 sets - 10 reps - Beginner Bridge  - 1 x daily - 7 x weekly - 3 sets - 10 reps - Sit to Stand Without Arm Support  - 2-3 x daily - 7 x weekly - 1 sets - 5 reps - Seated Cervical Flexion AROM  - 1 x daily - 7 x weekly - 1 sets - 10 reps - Supine Cervical Rotation AROM on Pillow  - 1 x daily - 7 x weekly - 1 sets - 10 reps -  Seated Isometric Cervical Sidebending  - 2 x daily - 7 x weekly - 1 sets - 10 reps - Standing Pursed Lip Breathing with Ribcage Elevation  - 2-3 x daily - 7 x weekly - 1 sets - 10 reps - Seated Thoracic Lumbar Extension with Pectoralis Stretch  - 1-2 x daily - 7 x weekly - 1 sets - 10 reps - 5-10 sec hold - Sternocleidomastoid Stretch  - 1 x daily - 7 x weekly - 3 sets - 10 reps - Seated Trunk Rotation - Hands on Shoulders  - 1 x daily - 7 x weekly - 3 sets - 10 reps - Standing Tandem Balance with Counter Support  - 1 x daily - 7 x weekly - 1 sets - 3-5 reps - 10-30 sec hold - Stride Stance Weight Shift with Opposite Arm Reach  - 1 x daily - 7 x weekly - 3 sets - 10 reps  ASSESSMENT: CLINICAL IMPRESSION: PT is continuing to progress with functional strengthening adding farmer's carry with kettle bells and standing UE reaching/strengthening tasks. PT is cuing for breath support and offering rest breaks. Patient is fatigued today from weekend. PT planning to continue to work on current program emphasizing breath support, endurance, core strength, LE strength. Patient to d/c at end of current scheduled visits and will follow up with orthopedic spine specialist due to continued neck pain. The patient continues to get reduced pain with physical therapy session that carries over when she leaves at end of session per report from last week. She feels the exercises will continue to benefit her when she is on hold awaiting spine specialist appointment.   EVAL: Patient is an 83  y.o. female who was seen today for physical therapy evaluation and treatment for compression fx and low back pain. Her pain varies from 5/10 up to 10/10 and is typically worse in the morning x first hour of waking, coughing, and after sitting and transitioning to stand. Evaluation was limited due to high irritability and precautions due to fx. She presents with impairments in ROM, strength, pain, and overall functional mobility. PT  recommends lumbar brace due to characteristics of pain during evaluation (winces with coughing and sit<>stand)-- will reach out to request order.  GOALS: Goals reviewed with patient? Yes   UPDATED GOALS: SHORT TERM GOALS: Target date: 01/11/24  The patient will improve L cervical rotation to >  or equal to 45 degrees.  Baseline:  see chart Goal status:  NOT MET   2. The patient will report pain < or equal to 2/10 at worst. Baseline: pain varies day to day Goal status: NOT MET-- patient continuing with pain  LONG TERM GOALS: Target date: 02/10/24   The patient will be indep with HEP progression. Baseline:  performing current HEP Goal status: UPDATED  2.  The patient will improve Berg balance scale to > or equal to 48/56. Baseline: 46/56  Goal status: MET on 01/12/24  3.  The patient will improve 5 time sit<>stand to < or equal to 16 seconds. Baseline: 19.91 seconds Goal status: MET--15.61 seconds  PLAN:  PT FREQUENCY: 2x/week  PT DURATION: 4 weeks  PLANNED INTERVENTIONS: [id number]- PT Re-evaluation, 97750- Physical Performance Testing, 97110-Therapeutic exercises, 97530- Therapeutic activity, [id number]- Neuromuscular re-education, 97535- Self Care, [id number]- Manual therapy, [id number]- Gait training, [phone]- Aquatic Therapy, Patient/Family education, Balance training, Cryotherapy, and Moist heat.  PLAN FOR NEXT SESSION: dynamic balance, overhead reaching activities; cervical strengthening, postural strengthening, core engagement, balance training, endurance, ribcage mobility/trunk roation.  *Plan to put on hold (or d/c) at end of scheduled visits 11/26 until after spine specialist. (PT may need to do renewal d/c summary for last session). Check HEP, may add theraband rows and shoulder extension for scapular depression.  Annalycia Done, PT 01/16/2024, 10:57 AM

## 2024-01-18 ENCOUNTER — Ambulatory Visit

## 2024-01-18 DIAGNOSIS — M6281 Muscle weakness (generalized): Secondary | ICD-10-CM

## 2024-01-18 DIAGNOSIS — M542 Cervicalgia: Secondary | ICD-10-CM

## 2024-01-18 DIAGNOSIS — M5459 Other low back pain: Secondary | ICD-10-CM

## 2024-01-18 DIAGNOSIS — R293 Abnormal posture: Secondary | ICD-10-CM

## 2024-01-18 NOTE — Therapy (Signed)
 OUTPATIENT PHYSICAL THERAPY THORACOLUMBAR TREATMENT     Patient Name: Christy Gutierrez MRN: [id number] DOB:10-07-1940, 83 y.o., female Today's Date: 01/18/2024  END OF SESSION:  PT End of Session - 01/18/24 1101     Visit Number 36    Number of Visits 37    Date for Recertification  02/10/24    Authorization Type [employer] medicare    PT Start Time 1102    PT Stop Time 1144    PT Time Calculation (min) 42 min    Activity Tolerance Patient tolerated treatment well    Behavior During Therapy WFL for tasks assessed/performed          Past Medical History:  Diagnosis Date   Atrial fibrillation (HCC)    Back pain    Hypertension    Past Surgical History:  Procedure Laterality Date   SKIN CANCER EXCISION  05/2022   on the chest   TONSILLECTOMY     TUBAL LIGATION     bilateral   Patient Active Problem List   Diagnosis Date Noted   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   Atrial fibrillation (HCC) 10/06/2022   Near syncope 09/03/2020   Pulmonary nodule, right 06/26/2019   MDD (major depressive disorder), recurrent episode 12/26/2018   Emphysema lung (HCC) 03/28/2018   Episodic lightheadedness 08/08/2017   Essential hypertension 03/05/2016   Lumbar spondylosis 04/02/2015   Left knee pain 04/02/2015   Migraine 05/08/2013   Osteoporosis 11/10/2009   Vitamin D  deficiency 11/04/2009   Hyperlipidemia 11/04/2009   Backache 11/04/2009   FATIGUE 11/04/2009   Asymptomatic postmenopausal status 11/04/2009   PCP: Alvan Craven MD REFERRING PROVIDER: Alvan Craven MD REFERRING DIAG:  Diagnosis  S32.020A (ICD-10-CM) - Compression fracture of L2 vertebra, initial encounter (HCC)   Rationale for Evaluation and Treatment: Rehabilitation  THERAPY DIAG:  Other low back pain  Muscle weakness (generalized)  Cervicalgia  Abnormal posture  ONSET DATE: 09/13/23   SUBJECTIVE:                                                                                                                                                                                           SUBJEtCTIVE STATEMENT: Patient reports she on day 2 of 3 of wearing heart monitor and has echocardiogram scheduled in beginning of December (follow-up on a-fib). Patient states hardest thing about the furnace rack is carrying it in front of her; thinks it weighs approx 8 lbs. Patient states she woke up not feeling well and tired.   EVAL: The patient reports that she had a fall on May 28 and hit her head and bumped her arm. 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 She is having significant pain that is constant in nature. She cannot get down on the floor (she would get down to pray or exercise prior to this pain) and notes difficulty trying to get up.   PERTINENT HISTORY:  Osteoporosis, H/o back pain from early 90s. A-fib, emphysema, migraine, HTN.  PAIN:  Are you having pain? Yes: NPRS scale: neck: 4/10 (Lt) Pain location:  neck Pain description: soreness, gets to be sharp pain and muscle spasms Aggravating factors: getting up after sitting Relieving factors: heating pad,   PRECAUTIONS: Back *precautions due to acute compression fx and osteoporosis  RED FLAGS: Compression fracture: Yes: fall on May 28 with pain beginning 2 weeks post fall   Bowel and bladder: denies issues Denies saddle paresthesias  WEIGHT BEARING RESTRICTIONS: No  FALLS:  Has patient fallen in last 6 months? Yes. Number of falls 1 time in May; she describes 2-3 over the past year  LIVING ENVIRONMENT: Lives with: lives alone Lives in: House/apartment Stairs: Yes: Internal: 12 to basement, 4-5 going into home;  steps; one rail Has following equipment at home: None  PLOF: Independent  PATIENT GOALS: reduce, walk upright, return to doing exercises  OBJECTIVE:  Note: Objective measures were completed at Evaluation unless otherwise noted.  DIAGNOSTIC  FINDINGS:  IMPRESSION: 1. New mild compression deformity of L2, possibly acute. 2. Stable L1 compression deformity. 3. Moderate multilevel degenerative change.   PATIENT SURVEYS:  Modified Oswestry: 54%  COGNITION: Overall cognitive status: Within functional limits for tasks assessed     SENSATION: WFL  POSTURE: No Significant postural limitations  PALPATION: None today-- due to   LUMBAR ROM: Deferred due to recent compression fx  LOWER EXTREMITY ROM:   WFLs  LOWER EXTREMITY MMT:  deferred due to pain-- able to do SLR   LUMBAR SPECIAL TESTS:  Deferred due to compression fx  FUNCTIONAL TESTS:  Log roll for bed mobility instructed and performed  GAIT: Distance walked: 20/13.16 seconds=1.52 ft/sec Assistive device utilized: None Level of assistance: Complete Independence Comments: slowed pace Pain after walking when she first sits down  CERVICAL ROM:  Active ROM A/PROM (deg) 11/10/23 AROM 12/05/23 AROM 12/12/23 AROM 12/28/23 AROM 01/09/24  Flexion   32 Pain with return to midline 34 Pain on Lt side coming back up   Extension   32  33   Right lateral flexion 28 22 25 18    Left lateral flexion 10 18 18 14    Right rotation 58  40 45 35 43  Left rotation 30 40 36 34 24 *painful today   (Blank rows = not tested)  Neuromuscular re-ed: Berg=44/56 Berg 12/05/23=4656   [facility] Adult PT Treatment:                                                DATE: 01/18/2024 Therapeutic Activity: UBE L3 x 2 min fwd & 2 min bkwd Standing resisted rows + blue TB  Standing bicep curls + 3#DB --> 4#DB Self Care: Discussion on daily chores and fatigue levels with them; brainstormed on ways to break-up chore schedule throughout week for optimal endurance and energy conservation   [facility] Adult PT Treatment:  DATE: 01/16/24 Therapeutic Exercise: Warm up with UBE for UE endurance and standing tolerance 2 min forward at level 1 and 30 seconds  backwards.  Sidelying cervical lateral flexion x 5 reps Sidelying lat flexion stretch over pool noodle within tolerance Neuromuscular re-ed: Posture re-education with yellow band anchored superior and posterior to patient with pulling down from overhead in front of her x 8 reps with cues Yellow band with pull downs shoulder extension with postural cues for lengthening Seated on physiodisc working on lumbar pelvic tilt x 10 reps with breathing cues Therapeutic Activity: Gentle rocking lumbar rotation adding neck contralateral rotation Chest opening stretch supine Sit<>stand with ball overhead reaching tapping into breath support with cues Farmer's carry 5# KB in right hand, then left x 160 ft each Palloff press yellow band R and L x 8 reps   [facility] Adult PT Treatment:                                                DATE: 01/12/24 Neuromuscular re-ed: Seated on physiodisc working on lumbar pelvic tilt into anterior tilt with spinal alignment, then adding UE reaching, then 1# weights for reaching tasks while maintaining posture Berg=49/56 Therapeutic Activity: Standing theraband lat pulldown with feet in stride 10 reps x 2 sets Standing shoulder extension with core engagement and breathing cues x 10 reps Standing overhead reach with cues for posture and core engagement Yoga block anterior to overhead with sit<>stand working on trunk lengthening and core engagement Gait: Gait activities with cues for core engagement, anterior pelvic tilt cues to reduce x 200 ft Patient continues to fatigue with gait and demonstrates a scapular retraction with more mechanical arm swing worsening with fatigue    [facility] Adult PT Treatment:                                                DATE: 01/09/24 Therapeutic Exercise: AROM neck-- see chart above Supine neck isometrics lateral flexion R nad L x 3 reps x 5 second holds Manual Therapy: STM cervical musculature supine PROM neck in supine Neuromuscular  re-ed: Postural re-education sitting on physiodisc working on lumbar pelvic tilt anteriorly to align spine and then doing ER and alternating reaching Sitting on physiodisc working on increasing length between ribs and pelvis Serratus wall slide on foam roller x 10 reps Seated scapular retraction  while on physiodisc Therapeutic Activity: Anatomical wall press with palms out and scapular activation x 10 reps Standing forward reach with yellow band taught to engage serratus bilat x 10 reps Gait: Gait activities working on arm swing with gait x 200 ft  Resisted gait pressing into an unweighted sled to engage core and gluts x 60 ft    PATIENT EDUCATION:  Education details: Updated HEP Person educated: Patient Education method: Programmer, Multimedia, Facilities Manager, and Handouts Education comprehension: verbalized understanding, returned demonstration, and needs further education  HOME EXERCISE PROGRAM: Access Code: [email or web address] URL: [email or web address] Date: 12/26/2023 Prepared by: Lamarr Price  Exercises - Supine Active Straight Leg Raise  - 1 x daily - 5 x weekly - 1 sets - 10 reps - Beginner Bridge  - 1 x daily - 7 x weekly - 3 sets - 10 reps - Sit to Stand Without  Arm Support  - 2-3 x daily - 7 x weekly - 1 sets - 5 reps - Seated Cervical Flexion AROM  - 1 x daily - 7 x weekly - 1 sets - 10 reps - Supine Cervical Rotation AROM on Pillow  - 1 x daily - 7 x weekly - 1 sets - 10 reps - Seated Isometric Cervical Sidebending  - 2 x daily - 7 x weekly - 1 sets - 10 reps - Standing Pursed Lip Breathing with Ribcage Elevation  - 2-3 x daily - 7 x weekly - 1 sets - 10 reps - Seated Thoracic Lumbar Extension with Pectoralis Stretch  - 1-2 x daily - 7 x weekly - 1 sets - 10 reps - 5-10 sec hold - Sternocleidomastoid Stretch  - 1 x daily - 7 x weekly - 3 sets - 10 reps - Seated Trunk Rotation - Hands on Shoulders  - 1 x daily - 7 x weekly - 3 sets - 10 reps - Standing Tandem Balance with  Counter Support  - 1 x daily - 7 x weekly - 1 sets - 3-5 reps - 10-30 sec hold - Stride Stance Weight Shift with Opposite Arm Reach  - 1 x daily - 7 x weekly - 3 sets - 10 reps  ASSESSMENT: CLINICAL IMPRESSION: Noted fatigue after completing UBE activity; patient recovered with seated rest break. Continued postural and UE strengthening exercises as tolerated due to fatigue. Discussion with patient on ways to conserve energy with daily chores and monitoring fatigue throughout week.   EVAL: Patient is an 83  y.o. female who was seen today for physical therapy evaluation and treatment for compression fx and low back pain. Her pain varies from 5/10 up to 10/10 and is typically worse in the morning x first hour of waking, coughing, and after sitting and transitioning to stand. Evaluation was limited due to high irritability and precautions due to fx. She presents with impairments in ROM, strength, pain, and overall functional mobility. PT recommends lumbar brace due to characteristics of pain during evaluation (winces with coughing and sit<>stand)-- will reach out to request order.  GOALS: Goals reviewed with patient? Yes   UPDATED GOALS: SHORT TERM GOALS: Target date: 01/11/24  The patient will improve L cervical rotation to > or equal to 45 degrees.  Baseline:  see chart Goal status:  NOT MET   2. The patient will report pain < or equal to 2/10 at worst. Baseline: pain varies day to day Goal status: NOT MET-- patient continuing with pain  LONG TERM GOALS: Target date: 02/10/24   The patient will be indep with HEP progression. Baseline:  performing current HEP Goal status: UPDATED  2.  The patient will improve Berg balance scale to > or equal to 48/56. Baseline: 46/56  Goal status: MET on 01/12/24  3.  The patient will improve 5 time sit<>stand to < or equal to 16 seconds. Baseline: 19.91 seconds Goal status: MET--15.61 seconds  PLAN:  PT FREQUENCY: 2x/week  PT DURATION: 4  weeks  PLANNED INTERVENTIONS: [id number]- PT Re-evaluation, 97750- Physical Performance Testing, 97110-Therapeutic exercises, 97530- Therapeutic activity, [id number]- Neuromuscular re-education, 97535- Self Care, [id number]- Manual therapy, [id number]- Gait training, [phone]- Aquatic Therapy, Patient/Family education, Balance training, Cryotherapy, and Moist heat.  PLAN FOR NEXT SESSION: Follow-up on fatigue (needed prolonged rest breaks last visit). Dynamic balance, overhead reaching activities; cervical strengthening, postural strengthening, core engagement, balance training, endurance, ribcage mobility/trunk roation.  *Plan to put on hold (or d/c) at end  of scheduled visits 11/26 until after spine specialist. (PT may need to do renewal d/c summary for last session). Check HEP, may add theraband rows and shoulder extension for scapular depression.  Lamarr GORMAN Price, PTA 01/18/2024, 11:46 AM

## 2024-01-21 ENCOUNTER — Other Ambulatory Visit: Payer: Self-pay | Admitting: Family Medicine

## 2024-01-23 ENCOUNTER — Ambulatory Visit: Admitting: Rehabilitative and Restorative Service Providers"

## 2024-01-23 ENCOUNTER — Encounter: Payer: Self-pay | Admitting: Rehabilitative and Restorative Service Providers"

## 2024-01-23 DIAGNOSIS — R293 Abnormal posture: Secondary | ICD-10-CM

## 2024-01-23 DIAGNOSIS — M5459 Other low back pain: Secondary | ICD-10-CM

## 2024-01-23 DIAGNOSIS — M6281 Muscle weakness (generalized): Secondary | ICD-10-CM

## 2024-01-23 DIAGNOSIS — M542 Cervicalgia: Secondary | ICD-10-CM

## 2024-01-23 NOTE — Therapy (Signed)
 OUTPATIENT PHYSICAL THERAPY THORACOLUMBAR TREATMENT     Patient Name: Christy Gutierrez MRN: [id number] DOB:May 01, 1940, 83 y.o., female Today's Date: 01/23/2024  END OF SESSION:  PT End of Session - 01/23/24 1108     Visit Number 37    Number of Visits 37    Date for Recertification  02/10/24    Authorization Type aetna medicare    Progress Note Due on Visit 40    PT Start Time 1106    PT Stop Time 1148    PT Time Calculation (min) 42 min    Activity Tolerance Patient tolerated treatment well    Behavior During Therapy WFL for tasks assessed/performed          Past Medical History:  Diagnosis Date   Atrial fibrillation (HCC)    Back pain    Hypertension    Past Surgical History:  Procedure Laterality Date   SKIN CANCER EXCISION  05/2022   on the chest   TONSILLECTOMY     TUBAL LIGATION     bilateral   Patient Active Problem List   Diagnosis Date Noted   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   Atrial fibrillation (HCC) 10/06/2022   Near syncope 09/03/2020   Pulmonary nodule, right 06/26/2019   MDD (major depressive disorder), recurrent episode 12/26/2018   Emphysema lung (HCC) 03/28/2018   Episodic lightheadedness 08/08/2017   Essential hypertension 03/05/2016   Lumbar spondylosis 04/02/2015   Left knee pain 04/02/2015   Migraine 05/08/2013   Osteoporosis 11/10/2009   Vitamin D  deficiency 11/04/2009   Hyperlipidemia 11/04/2009   Backache 11/04/2009   FATIGUE 11/04/2009   Asymptomatic postmenopausal status 11/04/2009   PCP: Alvan Craven MD REFERRING PROVIDER: Alvan Craven MD REFERRING DIAG:  Diagnosis  S32.020A (ICD-10-CM) - Compression fracture of L2 vertebra, initial encounter (HCC)   Rationale for Evaluation and Treatment: Rehabilitation  THERAPY DIAG:  Other low back pain  Muscle weakness (generalized)  Cervicalgia  Abnormal posture  ONSET DATE: 09/13/23   SUBJECTIVE:                                                                                                                                                                                           SUBJEtCTIVE STATEMENT: The patient reports she is done with the heart monitor. She sees spine specialist and cardiologist in December.  She notes connection with posture/breathing to low back-- I feel like I can't straighten up later in the day.  EVAL: The patient reports that she had a fall on May 28 and hit her head and bumped her arm. 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 She is having significant pain that is constant in nature. She cannot get down on the floor (she would get down to pray or exercise prior to this pain) and notes difficulty trying to get up.   PERTINENT HISTORY:  Osteoporosis, H/o back pain from early 90s. A-fib, emphysema, migraine, HTN.  PAIN:  Are you having pain? Yes: NPRS scale: neck: 4/10 (Lt) Pain location:  neck Pain description: soreness, gets to be sharp pain and muscle spasms Aggravating factors: getting up after sitting Relieving factors: heating pad,   PRECAUTIONS: Back *precautions due to acute compression fx and osteoporosis  RED FLAGS: Compression fracture: Yes: fall on May 28 with pain beginning 2 weeks post fall   Bowel and bladder: denies issues Denies saddle paresthesias  WEIGHT BEARING RESTRICTIONS: No  FALLS:  Has patient fallen in last 6 months? Yes. Number of falls 1 time in May; she describes 2-3 over the past year  LIVING ENVIRONMENT: Lives with: lives alone Lives in: House/apartment Stairs: Yes: Internal: 12 to basement, 4-5 going into home;  steps; one rail Has following equipment at home: None  PLOF: Independent  PATIENT GOALS: reduce, walk upright, return to doing exercises  OBJECTIVE:  Note: Objective measures were completed at Evaluation unless otherwise noted.  DIAGNOSTIC FINDINGS:  IMPRESSION: 1. New mild compression deformity  of L2, possibly acute. 2. Stable L1 compression deformity. 3. Moderate multilevel degenerative change.   PATIENT SURVEYS:  Modified Oswestry: 54%  COGNITION: Overall cognitive status: Within functional limits for tasks assessed     SENSATION: WFL  POSTURE: No Significant postural limitations  PALPATION: None today-- due to   LUMBAR ROM: Deferred due to recent compression fx  LOWER EXTREMITY ROM:   WFLs  LOWER EXTREMITY MMT:  deferred due to pain-- able to do SLR   LUMBAR SPECIAL TESTS:  Deferred due to compression fx  FUNCTIONAL TESTS:  Log roll for bed mobility instructed and performed  GAIT: Distance walked: 20/13.16 seconds=1.52 ft/sec Assistive device utilized: None Level of assistance: Complete Independence Comments: slowed pace Pain after walking when she first sits down  CERVICAL ROM:  Active ROM A/PROM (deg) 11/10/23 AROM 12/05/23 AROM 12/12/23 AROM 12/28/23 AROM 01/09/24  Flexion   32 Pain with return to midline 34 Pain on Lt side coming back up   Extension   32  33   Right lateral flexion 28 22 25 18    Left lateral flexion 10 18 18 14    Right rotation 58  40 45 35 43  Left rotation 30 40 36 34 24 *painful today   (Blank rows = not tested)  Neuromuscular re-ed: Berg=44/56 Berg 12/05/23=4656   [facility] Adult PT Treatment:                                                DATE: 01/23/24 Therapeutic Exercise: UBE 1.25 min forward and 1.25 backwards-- patient gets fatigued Neuromuscular re-ed: Physioball postural awareness finding neutral Pelvic tilts pn physioball Standing core engagement working on sliding pillowcase posteriorly R and L x 8 reps each Supine core engagement lifting 2# weight with bilat Ues overhead with cues for breathing Therapeutic Activity: Serratus activation with yellow band (loop) while sitting on physioball Overhead shoulder press with 3# weight (one weight shared bilat)  Dead bug beginning with Les at 90/90 Step ups  to 4 step  into contralateral LE marching  Sit<>stand with yoga block for adductors-- hard, therefore removed block  Supine tightening TA with alternating LE straightening Farmer's carry 5# kettelbell x 160 ft R and L sides   [facility] Adult PT Treatment:                                                DATE: 01/18/2024 Therapeutic Activity: UBE L3 x 2 min fwd & 2 min bkwd Standing resisted rows + blue TB  Standing bicep curls + 3#DB --> 4#DB Self Care: Discussion on daily chores and fatigue levels with them; brainstormed on ways to break-up chore schedule throughout week for optimal endurance and energy conservation   [facility] Adult PT Treatment:                                                DATE: 01/16/24 Therapeutic Exercise: Warm up with UBE for UE endurance and standing tolerance 2 min forward at level 1 and 30 seconds backwards.  Sidelying cervical lateral flexion x 5 reps Sidelying lat flexion stretch over pool noodle within tolerance Neuromuscular re-ed: Posture re-education with yellow band anchored superior and posterior to patient with pulling down from overhead in front of her x 8 reps with cues Yellow band with pull downs shoulder extension with postural cues for lengthening Seated on physiodisc working on lumbar pelvic tilt x 10 reps with breathing cues Therapeutic Activity: Gentle rocking lumbar rotation adding neck contralateral rotation Chest opening stretch supine Sit<>stand with ball overhead reaching tapping into breath support with cues Farmer's carry 5# KB in right hand, then left x 160 ft each Palloff press yellow band R and L x 8 reps   [facility] Adult PT Treatment:                                                DATE: 01/12/24 Neuromuscular re-ed: Seated on physiodisc working on lumbar pelvic tilt into anterior tilt with spinal alignment, then adding UE reaching, then 1# weights for reaching tasks while maintaining posture Berg=49/56 Therapeutic Activity: Standing theraband lat  pulldown with feet in stride 10 reps x 2 sets Standing shoulder extension with core engagement and breathing cues x 10 reps Standing overhead reach with cues for posture and core engagement Yoga block anterior to overhead with sit<>stand working on trunk lengthening and core engagement Gait: Gait activities with cues for core engagement, anterior pelvic tilt cues to reduce x 200 ft Patient continues to fatigue with gait and demonstrates a scapular retraction with more mechanical arm swing worsening with fatigue   PATIENT EDUCATION:  Education details: Updated HEP Person educated: Patient Education method: Programmer, Multimedia, Facilities Manager, and Handouts Education comprehension: verbalized understanding, returned demonstration, and needs further education  HOME EXERCISE PROGRAM: Access Code: [email or web address] URL: [email or web address] Date: 12/26/2023 Prepared by: Lamarr Price  Exercises - Supine Active Straight Leg Raise  - 1 x daily - 5 x weekly - 1 sets - 10 reps - Beginner Bridge  - 1 x daily - 7 x weekly - 3 sets - 10 reps - Sit to  Stand Without Arm Support  - 2-3 x daily - 7 x weekly - 1 sets - 5 reps - Seated Cervical Flexion AROM  - 1 x daily - 7 x weekly - 1 sets - 10 reps - Supine Cervical Rotation AROM on Pillow  - 1 x daily - 7 x weekly - 1 sets - 10 reps - Seated Isometric Cervical Sidebending  - 2 x daily - 7 x weekly - 1 sets - 10 reps - Standing Pursed Lip Breathing with Ribcage Elevation  - 2-3 x daily - 7 x weekly - 1 sets - 10 reps - Seated Thoracic Lumbar Extension with Pectoralis Stretch  - 1-2 x daily - 7 x weekly - 1 sets - 10 reps - 5-10 sec hold - Sternocleidomastoid Stretch  - 1 x daily - 7 x weekly - 3 sets - 10 reps - Seated Trunk Rotation - Hands on Shoulders  - 1 x daily - 7 x weekly - 3 sets - 10 reps - Standing Tandem Balance with Counter Support  - 1 x daily - 7 x weekly - 1 sets - 3-5 reps - 10-30 sec hold - Stride Stance Weight Shift with Opposite Arm  Reach  - 1 x daily - 7 x weekly - 3 sets - 10 reps  ASSESSMENT: CLINICAL IMPRESSION: The patient notes lumbar spine is origin of other difficulties with functional standing. She gets some fatigue at low back, leading to postural change into trunk flexion, leading to difficulty with breathing. She rests when this occurs. PT continuing to progress core strengthening and functional activities to improve endurance and strength.  The patient tolerated activities well today. Plan to d/c next visit.   EVAL: Patient is an 83  y.o. female who was seen today for physical therapy evaluation and treatment for compression fx and low back pain. Her pain varies from 5/10 up to 10/10 and is typically worse in the morning x first hour of waking, coughing, and after sitting and transitioning to stand. Evaluation was limited due to high irritability and precautions due to fx. She presents with impairments in ROM, strength, pain, and overall functional mobility. PT recommends lumbar brace due to characteristics of pain during evaluation (winces with coughing and sit<>stand)-- will reach out to request order.  GOALS: Goals reviewed with patient? Yes  UPDATED GOALS: SHORT TERM GOALS: Target date: 01/11/24  The patient will improve L cervical rotation to > or equal to 45 degrees.  Baseline:  see chart Goal status:  NOT MET   2. The patient will report pain < or equal to 2/10 at worst. Baseline: pain varies day to day Goal status: NOT MET-- patient continuing with pain  LONG TERM GOALS: Target date: 02/10/24   The patient will be indep with HEP progression. Baseline:  performing current HEP Goal status: UPDATED  2.  The patient will improve Berg balance scale to > or equal to 48/56. Baseline: 46/56  Goal status: MET on 01/12/24  3.  The patient will improve 5 time sit<>stand to < or equal to 16 seconds. Baseline: 19.91 seconds Goal status: MET--15.61 seconds  PLAN:  PT FREQUENCY: 2x/week  PT  DURATION: 4 weeks  PLANNED INTERVENTIONS: 97164- PT Re-evaluation, 97750- Physical Performance Testing, 97110-Therapeutic exercises, 97530- Therapeutic activity, [id number]- Neuromuscular re-education, 97535- Self Care, [id number]- Manual therapy, [id number]- Gait training, [phone]- Aquatic Therapy, Patient/Family education, Balance training, Cryotherapy, and Moist heat.  PLAN FOR NEXT SESSION: Check d/c HEP, continue functional strength. PT will need to do  renewal and d/c for last session on 11/26. Check HEP, may add theraband rows and shoulder extension for scapular depression.  Sebastyan Snodgrass, PT 01/23/2024, 11:09 AM

## 2024-01-24 ENCOUNTER — Ambulatory Visit: Payer: Self-pay | Admitting: Cardiology

## 2024-01-24 DIAGNOSIS — I4891 Unspecified atrial fibrillation: Secondary | ICD-10-CM

## 2024-01-25 ENCOUNTER — Ambulatory Visit

## 2024-01-25 DIAGNOSIS — M542 Cervicalgia: Secondary | ICD-10-CM

## 2024-01-25 DIAGNOSIS — M6281 Muscle weakness (generalized): Secondary | ICD-10-CM

## 2024-01-25 DIAGNOSIS — R293 Abnormal posture: Secondary | ICD-10-CM

## 2024-01-25 DIAGNOSIS — M5459 Other low back pain: Secondary | ICD-10-CM | POA: Diagnosis not present

## 2024-01-25 NOTE — Therapy (Addendum)
 OUTPATIENT PHYSICAL THERAPY THORACOLUMBAR TREATMENT AND RENEWAL/DISCHARGE SUMMARY   Patient Name: Christy Gutierrez MRN: [id number] DOB:19-Oct-1940, 83 y.o., female Today's Date: 01/25/2024   PHYSICAL THERAPY DISCHARGE SUMMARY  Visits from Start of Care: 64  Current functional level related to goals / functional outcomes: See goals below-- the patient has met STGs and LTGs. PT initially began with emphasis on low back pain due to compression fractures. Pain also began in cervical spine (chronic issue that flared)-- PT has addressed this impairment, as well as balance and general functional strengthening.    Remaining deficits: The patient is being referred to spine specialist for interventions related to cervical spine chronic pain.    Education / Equipment: HEP, functional strengthening, breathing techniques.   Patient agrees to discharge. Patient goals were met. Patient is being discharged due to meeting the stated rehab goals.  END OF SESSION:  PT End of Session - 01/25/24 1105     Visit Number 38    Number of Visits 37    Date for Recertification  02/10/24    Authorization Type aetna medicare    PT Start Time 1105    PT Stop Time 1149    PT Time Calculation (min) 44 min    Activity Tolerance Patient tolerated treatment well    Behavior During Therapy WFL for tasks assessed/performed          Past Medical History:  Diagnosis Date   Atrial fibrillation (HCC)    Back pain    Hypertension    Past Surgical History:  Procedure Laterality Date   SKIN CANCER EXCISION  05/2022   on the chest   TONSILLECTOMY     TUBAL LIGATION     bilateral   Patient Active Problem List   Diagnosis Date Noted   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   Atrial fibrillation (HCC) 10/06/2022   Near syncope 09/03/2020   Pulmonary nodule, right 06/26/2019   MDD (major depressive disorder), recurrent episode 12/26/2018   Emphysema lung (HCC) 03/28/2018   Episodic lightheadedness 08/08/2017   Essential  hypertension 03/05/2016   Lumbar spondylosis 04/02/2015   Left knee pain 04/02/2015   Migraine 05/08/2013   Osteoporosis 11/10/2009   Vitamin D  deficiency 11/04/2009   Hyperlipidemia 11/04/2009   Backache 11/04/2009   FATIGUE 11/04/2009   Asymptomatic postmenopausal status 11/04/2009   PCP: Alvan Craven MD REFERRING PROVIDER: Alvan Craven MD REFERRING DIAG:  Diagnosis  S32.020A (ICD-10-CM) - Compression fracture of L2 vertebra, initial encounter (HCC)   Rationale for Evaluation and Treatment: Rehabilitation  THERAPY DIAG:  Other low back pain  Muscle weakness (generalized)  Cervicalgia  Abnormal posture  ONSET DATE: 09/13/23   SUBJECTIVE:  SUBJEtCTIVE STATEMENT: Patient reports she only emptied ash rack once due to warmer weather; states she continues to be most challenged with endurance walking long distances, needing rest breaks. Patient states she continues to have Lt neck pain.   EVAL: The patient reports that she had a fall on May 28 and hit her head and bumped her arm. 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 She is having significant pain that is constant in nature. She cannot get down on the floor (she would get down to pray or exercise prior to this pain) and notes difficulty trying to get up.   PERTINENT HISTORY:  Osteoporosis, H/o back pain from early 90s. A-fib, emphysema, migraine, HTN.  PAIN:  Are you having pain? Yes: NPRS scale: neck: 4/10 (Lt) Pain location:  neck Pain description: soreness, gets to be sharp pain and muscle spasms Aggravating factors: getting up after sitting Relieving factors: heating pad,   PRECAUTIONS: Back *precautions due to acute compression fx and  osteoporosis  RED FLAGS: Compression fracture: Yes: fall on May 28 with pain beginning 2 weeks post fall   Bowel and bladder: denies issues Denies saddle paresthesias  WEIGHT BEARING RESTRICTIONS: No  FALLS:  Has patient fallen in last 6 months? Yes. Number of falls 1 time in May; she describes 2-3 over the past year  LIVING ENVIRONMENT: Lives with: lives alone Lives in: House/apartment Stairs: Yes: Internal: 12 to basement, 4-5 going into home;  steps; one rail Has following equipment at home: None  PLOF: Independent  PATIENT GOALS: reduce, walk upright, return to doing exercises  OBJECTIVE:  Note: Objective measures were completed at Evaluation unless otherwise noted.  DIAGNOSTIC FINDINGS:  IMPRESSION: 1. New mild compression deformity of L2, possibly acute. 2. Stable L1 compression deformity. 3. Moderate multilevel degenerative change.   PATIENT SURVEYS:  Modified Oswestry: 54%  COGNITION: Overall cognitive status: Within functional limits for tasks assessed     SENSATION: WFL  POSTURE: No Significant postural limitations  PALPATION: None today-- due to   LUMBAR ROM: Deferred due to recent compression fx  LOWER EXTREMITY ROM:   WFLs  LOWER EXTREMITY MMT:  deferred due to pain-- able to do SLR   LUMBAR SPECIAL TESTS:  Deferred due to compression fx  FUNCTIONAL TESTS:  Log roll for bed mobility instructed and performed  GAIT: Distance walked: 20/13.16 seconds=1.52 ft/sec Assistive device utilized: None Level of assistance: Complete Independence Comments: slowed pace Pain after walking when she first sits down  CERVICAL ROM:  Active ROM A/PROM (deg) 11/10/23 AROM 12/05/23 AROM 12/12/23 AROM 12/28/23 AROM 01/09/24  Flexion   32 Pain with return to midline 34 Pain on Lt side coming back up   Extension   32  33   Right lateral flexion 28 22 25 18    Left lateral flexion 10 18 18 14    Right rotation 58  40 45 35 43  Left rotation 30 40 36 34 24  *painful today   (Blank rows = not tested)  Neuromuscular re-ed: Berg=44/56 Berg 12/05/23=4656   [facility] Adult PT Treatment:                                                DATE: 01/25/2024 Neuromuscular re-ed: Resisted rows + blue TB Shoulder extension +  red TB Standing rows pulling elbows back to wall + 1#DB Therapeutic Activity: Seated thoracic extension with towel roll horizontal at mid-back UBE L1 x1.5 min fwd & 1.5 bkwd Supine with towel roll along spine: Rows + 1#DB  Shoulder horizontal abd + yellow TB W arms open/close stretch    [facility] Adult PT Treatment:                                                DATE: 01/23/24 Therapeutic Exercise: UBE 1.25 min forward and 1.25 backwards-- patient gets fatigued Neuromuscular re-ed: Physioball postural awareness finding neutral Pelvic tilts on physioball Standing core engagement working on sliding pillowcase posteriorly R and L x 8 reps each Supine core engagement lifting 2# weight with bilat Ues overhead with cues for breathing Therapeutic Activity: Serratus activation with yellow band (loop) while sitting on physioball Overhead shoulder press with 3# weight (one weight shared bilat)  Dead bug beginning with Les at 90/90 Step ups to 4 step into contralateral LE marching  Sit<>stand with yoga block for adductors-- hard, therefore removed block  Supine tightening TA with alternating LE straightening Farmer's carry 5# kettelbell x 160 ft R and L sides   [facility] Adult PT Treatment:                                                DATE: 01/18/2024 Therapeutic Activity: UBE L3 x 2 min fwd & 2 min bkwd Standing resisted rows + blue TB  Standing bicep curls + 3#DB --> 4#DB Self Care: Discussion on daily chores and fatigue levels with them; brainstormed on ways to break-up chore schedule throughout week for optimal endurance and energy conservation   PATIENT EDUCATION:  Education details: Updated HEP Person educated: Patient Education  method: Programmer, Multimedia, Demonstration, and Handouts Education comprehension: verbalized understanding, returned demonstration, and needs further education  HOME EXERCISE PROGRAM: Access Code: [email or web address] URL: [email or web address] Date: 01/25/2024 Prepared by: Lamarr Price  Program Notes THESE SHOULD NOT INCREASE PAIN. If so, wait until you return to therapy.Straw breathing: Inhale through nose, exhale through straw  Exercises - Seated Thoracic Lumbar Extension with Pectoralis Stretch  - 1-2 x daily - 7 x weekly - 1 sets - 10 reps - 5-10 sec hold - Standing Tandem Balance with Counter Support  - 1 x daily - 7 x weekly - 1 sets - 3-5 reps - 10-30 sec hold - Stride Stance Weight Shift with Opposite Arm Reach  - 1 x daily - 7 x weekly - 3 sets - 10 reps - Standing Row with Anchored Resistance  - 1 x daily - 7 x weekly - 3 sets - 10 reps - Shoulder extension with resistance - Neutral  - 1 x daily - 7 x weekly - 1-3 sets - 10 reps - Supine Shoulder Horizontal Abduction with Resistance  - 1 x daily - 7 x weekly - 1-3 sets - 10 reps - Supine Thoracic Mobilization Towel Roll Vertical with Arm Stretch  - 2 x daily - 7 x weekly - 1 sets - 3 reps - 30 sec hold  ASSESSMENT: CLINICAL IMPRESSION: Patient tolerated resisted rows and shoulder extension well; exercises added to HEP. Patient continues to fatigue with standing exercises. Attempted standing rows  with light dumbbells with back to wall, however movement limited due to poor back stabilization with arm reach forward. Performed supine rows with towel roll along spine to promote passive chest opening with upper arm exercises. *PT Renewal for one visit to check HEP and ensure plan to continue with home strengthening.  EVAL: Patient is an 83  y.o. female who was seen today for physical therapy evaluation and treatment for compression fx and low back pain. Her pain varies from 5/10 up to 10/10 and is typically worse in the morning x first hour  of waking, coughing, and after sitting and transitioning to stand. Evaluation was limited due to high irritability and precautions due to fx. She presents with impairments in ROM, strength, pain, and overall functional mobility. PT recommends lumbar brace due to characteristics of pain during evaluation (winces with coughing and sit<>stand)-- will reach out to request order.  GOALS: Goals reviewed with patient? Yes  UPDATED GOALS: SHORT TERM GOALS: Target date: 01/11/24  The patient will improve L cervical rotation to > or equal to 45 degrees.  Baseline:  see chart Goal status:  NOT MET   2. The patient will report pain < or equal to 2/10 at worst. Baseline: pain varies day to day Goal status: NOT MET-- patient continuing with pain  LONG TERM GOALS: Target date: 02/10/24   The patient will be indep with HEP progression. Baseline:  performing current HEP Goal status: MET  2.  The patient will improve Berg balance scale to > or equal to 48/56. Baseline: 46/56  Goal status: MET on 01/12/24  3.  The patient will improve 5 time sit<>stand to < or equal to 16 seconds. Baseline: 19.91 seconds Goal status: MET--15.61 seconds  PLAN:  PT FREQUENCY: 2x/week  PT DURATION: 4 weeks  PLANNED INTERVENTIONS: [id number]- PT Re-evaluation, 97750- Physical Performance Testing, 97110-Therapeutic exercises, 97530- Therapeutic activity, [id number]- Neuromuscular re-education, 97535- Self Care, 02859- Manual therapy, [id number]- Gait training, [phone]- Aquatic Therapy, Patient/Family education, Balance training, Cryotherapy, and Moist heat.  PLAN FOR NEXT SESSION: DISCHARGE  WEAVER,CHRISTINA, PT Lamarr GORMAN Price, PTA 01/25/2024, 1:33 PM

## 2024-01-31 NOTE — Addendum Note (Signed)
 Addended by: LELON MAUS M on: 01/31/2024 03:59 PM   Modules accepted: Orders

## 2024-02-01 MED ORDER — DILTIAZEM HCL ER COATED BEADS 180 MG PO CP24: 180.0000 mg | ORAL_CAPSULE | Freq: Every day | ORAL | 3 refills | Status: AC

## 2024-02-02 ENCOUNTER — Ambulatory Visit (HOSPITAL_BASED_OUTPATIENT_CLINIC_OR_DEPARTMENT_OTHER)

## 2024-02-07 ENCOUNTER — Ambulatory Visit (HOSPITAL_BASED_OUTPATIENT_CLINIC_OR_DEPARTMENT_OTHER): Admission: RE | Admit: 2024-02-07 | Discharge: 2024-02-07 | Attending: Cardiology | Admitting: Cardiology

## 2024-02-07 DIAGNOSIS — I340 Nonrheumatic mitral (valve) insufficiency: Secondary | ICD-10-CM

## 2024-02-07 LAB — ECHOCARDIOGRAM COMPLETE
AR max vel: 2.85 cm2
AV Area VTI: 2.8 cm2
AV Area mean vel: 2.77 cm2
AV Mean grad: 1.3 mmHg
AV Peak grad: 2.7 mmHg
Ao pk vel: 0.82 m/s
Area-P 1/2: 5.09 cm2
Calc EF: 52.9 %
MV M vel: 4.88 m/s
MV Peak grad: 95.3 mmHg
MV VTI: 0.3 cm2
Radius: 0.3 cm
S' Lateral: 2.6 cm
Single Plane A2C EF: 55.3 %
Single Plane A4C EF: 52.2 %

## 2024-02-09 ENCOUNTER — Ambulatory Visit: Admitting: Orthopedic Surgery

## 2024-02-09 ENCOUNTER — Encounter: Payer: Self-pay | Admitting: Orthopedic Surgery

## 2024-02-09 ENCOUNTER — Ambulatory Visit: Payer: Self-pay

## 2024-02-09 VITALS — BP 127/86 | HR 83 | Ht 66.0 in | Wt 146.0 lb

## 2024-02-09 DIAGNOSIS — M542 Cervicalgia: Secondary | ICD-10-CM

## 2024-02-09 NOTE — Progress Notes (Signed)
 Orthopedic Spine Surgery Office Note  Assessment: Patient is a 83 y.o. female with chronic neck pain with progressive worsening over time.  No radicular pain.  Has had some imbalance but that has improved with PT.  No exam findings consistent with myelopathy and no upper extremity dexterity issues   Plan: -Explained that initially conservative treatment is tried as a significant number of patients may experience relief with these treatment modalities. Discussed that the conservative treatments include:  -activity modification  -physical therapy  -over the counter pain medications  -medrol  dosepak  -cervical steroid injections -Patient has tried PT, Tylenol , trigger point injections -Prescribed methocarbamol  as an additional modality to try.  Recommended a cervical ESI for diagnostic/therapeutic purposes -Briefly talked about surgery as a last option.  Patient states that she is not interested in any kind of surgery ever so will not bring up that option again.  Pain management would likely be a better last option for -Patient should return to office in 6-7 weeks, x-rays at next visit: None   Patient expressed understanding of the plan and all questions were answered to the patient's satisfaction.   ___________________________________________________________________________   History:  Patient is a 83 y.o. female who presents today for cervical spine.  Patient has had neck pain for over a year but it has gotten progressively worse.  She noted it getting worse in May of this year when she was working on a shower curtain and hit her head.  She has had pain moving her neck particularly to the left.  She has no pain radiating into either upper extremity.  She has tried using acetaminophen , PT, and trigger point injections but has not noticed any significant improvement with these treatments.   Weakness: Denies Difficulty with fine motor skills (e.g., buttoning shirts, handwriting):  Denies Symptoms of imbalance: Yes, was having issues with balance.  She was working with PT and noticed that this has improved.  She is not having any issues with balance currently.  She is not using any ambulatory assistive devices Paresthesias and numbness: Denies Bowel or bladder incontinence: Denies Saddle anesthesia: Denies  Treatments tried: PT, Tylenol , trigger point injections  Review of systems: Denies fevers and chills, night sweats, unexplained weight loss, history of cancer.  Has had pain that wakes her at night  Past medical history: Anxiety Atrial fibrillation HTN Chronic back pain   Allergies: amlodipine , clarithromycin  Past surgical history:  Cataract surgery Tubal ligation Tonsillectomy  Social history: Denies use of nicotine product (smoking, vaping, patches, smokeless) Alcohol use: denies Denies recreational drug use Has been on disability for back pain for years  Physical Exam:  General: no acute distress, appears stated age Neurologic: alert, answering questions appropriately, following commands Respiratory: unlabored breathing on room air, symmetric chest rise Psychiatric: appropriate affect, normal cadence to speech   MSK (spine):  -Strength exam      Left  Right Grip strength                5/5  5/5 Interosseus   5/5   5/5 Wrist extension  5/5  5/5 Wrist flexion   5/5  5/5 Elbow flexion   5/5  5/5 Deltoid    5/5  5/5  -Sensory exam    Sensation intact to light touch in C5-T1 nerve distributions of bilateral upper extremities  -Brachioradialis DTR: 1/4 on the left, 1/4 on the right -Biceps DTR: 1/4 on the left, 1/4 on the right  -Spurling: negative bilaterally -Hoffman sign: negative bilaterally -Clonus:  no beats bilaterally -Interosseous wasting: none seen -Grip and release test: negative  -Romberg: negative  -Gait: normal   Left shoulder exam: no pain through range of motion Right shoulder exam: no pain through range of  motion  Imaging: XRs of the cervical spine from 02/09/2024 were independently reviewed and interpreted, showing disc height loss with anterior osteophyte formation at C5/6 and C6/7.  Facet arthrosis in the lower cervical spine.  No fracture or dislocation seen.  No evidence of instability on flexion/extension views.  MRI of the cervical spine from 11/14/2023 was independently reviewed and interpreted, showing DDD at C5/6 and C6/7.  Central and bilateral foraminal stenosis at C5/6.  No T2 cord signal change seen.   Patient name: Christy Gutierrez Patient MRN: [id number] Date of visit: 02/09/2024

## 2024-02-14 ENCOUNTER — Telehealth: Payer: Self-pay | Admitting: Physical Medicine and Rehabilitation

## 2024-02-14 ENCOUNTER — Ambulatory Visit (INDEPENDENT_AMBULATORY_CARE_PROVIDER_SITE_OTHER)

## 2024-02-14 VITALS — Ht 65.0 in | Wt 147.0 lb

## 2024-02-14 DIAGNOSIS — M542 Cervicalgia: Secondary | ICD-10-CM

## 2024-02-14 DIAGNOSIS — M7918 Myalgia, other site: Secondary | ICD-10-CM

## 2024-02-14 DIAGNOSIS — M47812 Spondylosis without myelopathy or radiculopathy, cervical region: Secondary | ICD-10-CM

## 2024-02-14 NOTE — Progress Notes (Signed)
° °  Subjective:    Patient ID: Christy Gutierrez, female    DOB: 83 y.o., 09-10-1940   MRN: [id number]  Chief Complaint: Cervical spondylosis  Discussed the use of AI scribe software for clinical note transcription with the patient, who gave verbal consent to proceed.  History of Present Illness Christy Gutierrez is an 83 year old female with cervical spine issues who presents with worsening neck pain and balance difficulties. She was referred by a spine doctor for further management of her cervical spine issues.  Cervical spine dysfunction and gait impairment - Progressive neck stiffness described as perfectly stiff - No significant neck pain - Fatigue with ambulation, requiring frequent rest breaks - Limited ability to walk long distances due to fatigue and balance difficulties - Diligent with physical therapy and home exercise program for cervical spine  Balance difficulties and fall risk - Difficulty with balance, contributing to limited mobility - Concerned about declining function and increased fall risk, especially after observing a family member's decline following a fall  Tension-type headaches - Tension-type headaches responsive to Excedrin - Typically takes two Excedrin tablets daily, occasionally up to four - Headache relief occurs within 30 to 45 minutes after medication  Barriers to care - Lives alone - Difficulty obtaining transportation for medical visits, impacting access to care       Objective:   There were no vitals filed for this visit.  Neck: Tenderness palpation along the midline cervical spine. Tenderness palpation present in the paraspinal musculature.      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 She is concerned  about the long-term prognosis and fears worsening due to family history. Excedrin provides some relief, but symptoms have recently intensified. Prescribe a short course of oral steroids for flare-ups, to be taken as a complete course when needed. Continue physical therapy and home exercises to maintain muscle strength and flexibility. Discuss the option of epidural steroid injections with image guidance for temporary relief, emphasizing the need for a ride post-procedure due to legal liability. Advise on the safe use of Excedrin, noting that up to four per day is generally safe, but caution against excessive caffeine intake.  Upper extremity functional limitation   Cervical spondylosis causes functional limitations in the upper extremity, contributing to balance issues and fatigue. She experiences difficulty walking long distances and needs to sit down due to fatigue rather than pain. Continue physical therapy to improve endurance and posture. Encourage listening to her body and pacing activities to prevent fatigue.

## 2024-02-14 NOTE — Telephone Encounter (Signed)
 Pt called and said she needs to reschedule her appointment. CB#[phone]

## 2024-03-07 ENCOUNTER — Ambulatory Visit (INDEPENDENT_AMBULATORY_CARE_PROVIDER_SITE_OTHER): Admitting: Family Medicine

## 2024-03-07 ENCOUNTER — Encounter: Payer: Self-pay | Admitting: Family Medicine

## 2024-03-07 VITALS — BP 121/78 | HR 82 | Ht 65.0 in | Wt 145.1 lb

## 2024-03-07 DIAGNOSIS — I10 Essential (primary) hypertension: Secondary | ICD-10-CM | POA: Diagnosis not present

## 2024-03-07 DIAGNOSIS — I4811 Longstanding persistent atrial fibrillation: Secondary | ICD-10-CM | POA: Diagnosis not present

## 2024-03-07 DIAGNOSIS — F331 Major depressive disorder, recurrent, moderate: Secondary | ICD-10-CM

## 2024-03-07 DIAGNOSIS — D692 Other nonthrombocytopenic purpura: Secondary | ICD-10-CM

## 2024-03-07 DIAGNOSIS — Z23 Encounter for immunization: Secondary | ICD-10-CM | POA: Diagnosis not present

## 2024-03-07 DIAGNOSIS — J439 Emphysema, unspecified: Secondary | ICD-10-CM

## 2024-03-07 MED ORDER — BETAMETHASONE VALERATE 0.1 % EX OINT: TOPICAL_OINTMENT | Freq: Every day | CUTANEOUS | 1 refills | Status: AC | PRN

## 2024-03-07 NOTE — Assessment & Plan Note (Signed)
" °  Major depressive disorder, recurrent Managed with Lexapro . She feels at peace and prefers current regimen. Discussed potential to taper off Lexapro , but she prefers to maintain current treatment. - Continue Lexapro  as currently prescribed. "

## 2024-03-07 NOTE — Assessment & Plan Note (Signed)
" °  Longstanding persistent atrial fibrillation Managed with Eliquis . Discussed risks of stroke without anticoagulation versus fatigue with anticoagulation. - Continue Eliquis  for atrial fibrillation management. "

## 2024-03-07 NOTE — Assessment & Plan Note (Signed)
 Recent flares or exacerbations.  Does not need a refill currently

## 2024-03-07 NOTE — Progress Notes (Signed)
 "  Established Patient Office Visit  Patient ID: Christy Gutierrez, female    DOB: [birth year]  Age: 84 y.o. MRN: [id number] PCP: Alvan Christy BIRCH, MD  Chief Complaint  Patient presents with   Hypertension   mood    Subjective:     HPI  Discussed the use of AI scribe software for clinical note transcription with the patient, who gave verbal consent to proceed.  History of Present Illness Christy Gutierrez is an 84 year old female with chronic neck pain who presents with persistent neck pain and balance issues.  Neck pain and balance disturbance - Persistent neck pain unresponsive to lidocaine injections - Neck pain affects balance, causing staggering and sensation of 'balancing her head, neck on her shoulder' - Neck pain impacts daily activities, including walking and attending church  History of fall and back pain - History of fall in the shower with initial back involvement - Physical therapy has significantly improved back pain, currently taking a break from PT but has been doing her home exercises consistently - No current back pain symptoms reported  Fatigue and functional status - Experiences fatigue, attributed to living alone and managing daily activities independently - Fatigue and balance issues sometimes limit ability to walk and maintain independence - Desires to remain independent and continue walking when possible  Mood and emotional well-being - Currently taking Lexapro , questions its effectiveness for mood - Mood described as 'so-so' and emotionally worn due to physical limitations - Feels at peace with her situation despite challenges  Anticoagulation therapy and associated symptoms - On Eliquis  for atrial fibrillation for approximately one year - Believes Eliquis  contributes to fatigue as well as skin tears and bruising on forearms  Ear lesion - Notices a small piece of skin in her left ear for an extended period, not painful - No associated hearing issues or  significant discomfort     ROS    Objective:     BP 121/78   Pulse 82   Ht 5' 5 (1.651 m)   Wt 145 lb 1.9 oz (65.8 kg)   SpO2 96%   BMI 24.15 kg/m    Physical Exam Vitals and nursing note reviewed.  Constitutional:      Appearance: Normal appearance.  HENT:     Head: Normocephalic and atraumatic.     Right Ear: Tympanic membrane, ear canal and external ear normal.     Left Ear: Tympanic membrane, ear canal and external ear normal.     Ears:     Comments: Inside the left canal anteriorly there is a tiny skin tag. Eyes:     Conjunctiva/sclera: Conjunctivae normal.  Cardiovascular:     Rate and Rhythm: Normal rate and regular rhythm.  Pulmonary:     Effort: Pulmonary effort is normal.     Breath sounds: Normal breath sounds.  Skin:    General: Skin is warm and dry.  Neurological:     Mental Status: She is alert.  Psychiatric:        Mood and Affect: Mood normal.      No results found for any visits on 03/07/24.    The ASCVD Risk score (Arnett DK, et al., 2019) failed to calculate for the following reasons:   The 2019 ASCVD risk score is only valid for ages 50 to 45   * - Cholesterol units were assumed    Assessment & Plan:   Problem List Items Addressed This Visit       Cardiovascular and Mediastinum  Senile purpura   Gave reassurance.  The addition of the Eliquis  has exacerbated her symptoms but otherwise doing well.      Essential hypertension - Primary   Relevant Orders   CMP14+EGFR   Atrial fibrillation (HCC)    Longstanding persistent atrial fibrillation Managed with Eliquis . Discussed risks of stroke without anticoagulation versus fatigue with anticoagulation. - Continue Eliquis  for atrial fibrillation management.        Respiratory   Emphysema lung (HCC)   Recent flares or exacerbations.  Does not need a refill currently        Other   MDD (major depressive disorder), recurrent episode    Major depressive disorder,  recurrent Managed with Lexapro . She feels at peace and prefers current regimen. Discussed potential to taper off Lexapro , but she prefers to maintain current treatment. - Continue Lexapro  as currently prescribed.      Other Visit Diagnoses       Encounter for immunization       Relevant Orders   Flu vaccine HIGH DOSE PF(Fluzone Trivalent) (Completed)       Assessment and Plan Assessment & Plan Chronic neck pain Affecting balance and mobility. Previous lidocaine injections ineffective. Scheduled for further injections with [name]. - Continue with scheduled injections with [name]. - Provided handicap placard for mobility assistance.  Skin tag of external ear Skin tag in the external ear, no signs of malignancy or concerning features. - Continue to monitor skin tag for changes in size or appearance.    Return in about 6 months (around 09/04/2024) for Hypertension.    Christy Byars, MD [facility] Medicine at [facility]   "

## 2024-03-07 NOTE — Assessment & Plan Note (Signed)
 Gave reassurance.  The addition of the Eliquis  has exacerbated her symptoms but otherwise doing well.

## 2024-03-08 ENCOUNTER — Encounter: Admitting: Physical Medicine and Rehabilitation

## 2024-03-08 ENCOUNTER — Ambulatory Visit: Payer: Self-pay | Admitting: Family Medicine

## 2024-03-08 LAB — CMP14+EGFR
ALT: 18 IU/L (ref 0–32)
AST: 31 IU/L (ref 0–40)
Albumin: 4.4 g/dL (ref 3.7–4.7)
Alkaline Phosphatase: 72 IU/L (ref 48–129)
BUN/Creatinine Ratio: 20 (ref 12–28)
BUN: 13 mg/dL (ref 8–27)
Bilirubin Total: 0.6 mg/dL (ref 0.0–1.2)
CO2: 22 mmol/L (ref 20–29)
Calcium: 9.5 mg/dL (ref 8.7–10.3)
Chloride: 104 mmol/L (ref 96–106)
Creatinine, Ser: 0.66 mg/dL (ref 0.57–1.00)
Globulin, Total: 2.2 g/dL (ref 1.5–4.5)
Glucose: 87 mg/dL (ref 70–99)
Potassium: 4.6 mmol/L (ref 3.5–5.2)
Sodium: 141 mmol/L (ref 134–144)
Total Protein: 6.6 g/dL (ref 6.0–8.5)
eGFR: 86 mL/min/1.73

## 2024-03-08 NOTE — Progress Notes (Signed)
 Great news!  Liver enzymes are back to normal.

## 2024-03-11 ENCOUNTER — Other Ambulatory Visit: Payer: Self-pay | Admitting: Family Medicine

## 2024-03-12 ENCOUNTER — Encounter: Admitting: Physical Medicine and Rehabilitation

## 2024-03-13 ENCOUNTER — Ambulatory Visit: Payer: Self-pay

## 2024-03-13 NOTE — Telephone Encounter (Signed)
 The patient is scheduled with the provider on 03/14/24 at 1130 am to address the following symptoms.

## 2024-03-13 NOTE — Telephone Encounter (Signed)
 FYI Only or Action Required?: FYI only for provider: appointment scheduled on 03/14/2024.  Patient was last seen in primary care on 03/07/2024 by Alvan Dorothyann BIRCH, MD.  Called Nurse Triage reporting Pelvic Pain.  Symptoms began several days ago.  Interventions attempted: Rest, hydration, or home remedies.  Symptoms are: unchanged.  Triage Disposition: See Physician Within 24 Hours  Patient/caregiver understands and will follow disposition?: Yes     Reason for Disposition  [1] MILD pain (e.g., does not interfere with normal activities) AND [2] pain comes and goes (cramps) AND [3] present > 48 hours  (Exception: This same abdominal pain is a chronic symptom recurrent or ongoing AND present > 4 weeks.)  Answer Assessment - Initial Assessment Questions Patient states that she started to feel flank pain and was shivering on Friday, then Saturday she still felt this pain and notes vomiting. Sunday and Monday she was feeling more of a pressure and discomfort in the lower abdomen. She was pretty certain it's a kidney stone and was hoping it was passing. Today she reports darker urine and states she feels more of a pressure in the lower abdomen/pelvic area, but no other symptoms are present. Office visit advised.   1. LOCATION: Where does it hurt?      Lower abdomen/pelvic area  2. RADIATION: Does the pain shoot anywhere else? (e.g., chest, back)     No  3. ONSET: When did the pain begin? (e.g., minutes, hours or days ago)      Friday  4. SUDDEN: Gradual or sudden onset?     Gradual  5. PATTERN Does the pain come and go, or is it constant?     Comes and goes  6. SEVERITY: How bad is the pain?  (e.g., Scale 1-10; mild, moderate, or severe)     5/10 pressure  7. RECURRENT SYMPTOM: Have you ever had this type of stomach pain before? If Yes, ask: When was the last time? and What happened that time?      Has had this happen previously and it was a kidney stone  8.  CAUSE: What do you think is causing the stomach pain? (e.g., gallstones, recent abdominal surgery)     Kidney stone  9. RELIEVING/AGGRAVATING FACTORS: What makes it better or worse? (e.g., antacids, bending or twisting motion, bowel movement)     Unknown  10. OTHER SYMPTOMS: Do you have any other symptoms? (e.g., back pain, diarrhea, fever, urination pain, vomiting)       No other symptoms at this time  11. PREGNANCY: Is there any chance you are pregnant? When was your last menstrual period?       NA  Protocols used: Abdominal Pain - Female-A-AH  Summary: Symptoms not improving, Kidney stone   Reason for Triage: Urine is darker, has been passing a kidney stone since last Friday. Pressure in lower abdomen. States she was instructed by her PCP to notify the clinic of this occurrence  Best contact: [phone]

## 2024-03-14 ENCOUNTER — Ambulatory Visit

## 2024-03-14 ENCOUNTER — Ambulatory Visit: Admitting: Family Medicine

## 2024-03-14 ENCOUNTER — Encounter: Payer: Self-pay | Admitting: Family Medicine

## 2024-03-14 VITALS — BP 134/77 | HR 55 | Ht 65.0 in | Wt 145.0 lb

## 2024-03-14 DIAGNOSIS — R102 Pelvic and perineal pain unspecified side: Secondary | ICD-10-CM

## 2024-03-14 DIAGNOSIS — R310 Gross hematuria: Secondary | ICD-10-CM

## 2024-03-14 DIAGNOSIS — R319 Hematuria, unspecified: Secondary | ICD-10-CM

## 2024-03-14 DIAGNOSIS — R82998 Other abnormal findings in urine: Secondary | ICD-10-CM

## 2024-03-14 DIAGNOSIS — R1024 Suprapubic pain: Secondary | ICD-10-CM

## 2024-03-14 DIAGNOSIS — R10A0 Flank pain, unspecified side: Secondary | ICD-10-CM | POA: Diagnosis not present

## 2024-03-14 LAB — POCT URINALYSIS DIP (CLINITEK)
Bilirubin, UA: NEGATIVE
Glucose, UA: NEGATIVE mg/dL
Leukocytes, UA: NEGATIVE
Nitrite, UA: NEGATIVE
POC PROTEIN,UA: 100 — AB
Spec Grav, UA: 1.025
Urobilinogen, UA: 0.2 U/dL
pH, UA: 5.5

## 2024-03-14 NOTE — Progress Notes (Signed)
 "  Acute Office Visit  Patient ID: Christy Gutierrez, female    DOB: 14-Oct-1940, 84 y.o.   MRN: [id number]  PCP: Alvan Dorothyann BIRCH, MD  Chief Complaint  Patient presents with   discolored urine    Subjective:     HPI  Discussed the use of AI scribe software for clinical note transcription with the patient, who gave verbal consent to proceed.  History of Present Illness Christy Gutierrez is an 84 year old female who presents with right-sided abdominal pain and dark urine.  Abdominal pain and pressure - Onset Friday around 6 PM with severe right-sided abdominal pain - Initial suspicion for appendicitis or kidney stone - Pain accompanied by significant discomfort and pressure in the lower abdomen - Pain subsided by Saturday morning, but persistent abdominal pressure remained - Pressure has since shifted from right side to mid-abdomen - No recurrence of pain since Friday, only ongoing pressure  Gastrointestinal symptoms - Multiple episodes of vomiting on Friday night - Intermittent nausea over the weekend, improved with eating - No further vomiting after Friday night  Urinary changes - Dark urine first noticed on Monday, progressively darker by Tuesday - No mention of dysuria, frequency, or urgency - Currently drinking plenty of water  Systemic symptoms - Felt extremely cold on Friday night, unable to get warm - No current fever or chills reported  Medication use and anticoagulation concerns - Took muscle relaxers and two Tylenol  on Friday night, which helped with sleep - Currently on a blood thinner - Concerned about possible bleeding, particularly in relation to dark urine   ROS     Objective:    BP 134/77   Pulse (!) 55   Ht 5' 5 (1.651 m)   Wt 145 lb (65.8 kg)   SpO2 97%   BMI 24.13 kg/m    Physical Exam Vitals and nursing note reviewed.  Constitutional:      Appearance: Normal appearance.  HENT:     Head: Normocephalic and atraumatic.  Eyes:      Conjunctiva/sclera: Conjunctivae normal.  Cardiovascular:     Rate and Rhythm: Normal rate and regular rhythm.  Pulmonary:     Effort: Pulmonary effort is normal.     Breath sounds: Normal breath sounds.  Abdominal:     General: Bowel sounds are normal. There is no distension.     Palpations: Abdomen is soft.     Tenderness: There is abdominal tenderness. There is no guarding.     Comments: Mild suprapubic tenderness.  Skin:    General: Skin is warm and dry.  Neurological:     Mental Status: She is alert.  Psychiatric:        Mood and Affect: Mood normal.       Results for orders placed or performed in visit on 03/14/24  POCT URINALYSIS DIP (CLINITEK)  Result Value Ref Range   Color, UA other (A) yellow   Clarity, UA cloudy (A) clear   Glucose, UA negative negative mg/dL   Bilirubin, UA negative negative   Ketones, POC UA trace (5) (A) negative mg/dL   Spec Grav, UA 8.974 8.989 - 1.025   Blood, UA large (A) negative   pH, UA 5.5 5.0 - 8.0   POC PROTEIN,UA =100 (A) negative, trace   Urobilinogen, UA 0.2 0.2 or 1.0 E.U./dL   Nitrite, UA Negative Negative   Leukocytes, UA Negative Negative       Assessment & Plan:   Problem List Items Addressed This Visit  None Visit Diagnoses       Dark urine    -  Primary   Relevant Orders   POCT URINALYSIS DIP (CLINITEK) (Completed)     Hematuria, unspecified type       Relevant Orders   Urine Culture     Pelvic pain         Gross hematuria         Suprapubic pain       Relevant Orders   CT RENAL STONE STUDY       Assessment and Plan Assessment & Plan Suprapubic pain that started initially as right flank and right lower quadrant pain.   Most suspicious of nephrolithiasis with hematuria Acute nephrolithiasis with hematuria, likely due to stone movement through the ureter. Blood thinner may increase bleeding risk but not dangerous. - Ordered CT scan to confirm nephrolithiasis and assess stone size. - Will evaluate  need for urology referral based on CT findings and stone size. - Advised rest and hydration. - Urinalysis still showing blood present    No orders of the defined types were placed in this encounter.   Return if symptoms worsen or fail to improve.  [name], MD [facility] Medicine at [facility]   "

## 2024-03-15 ENCOUNTER — Other Ambulatory Visit: Payer: Self-pay | Admitting: Family Medicine

## 2024-03-15 ENCOUNTER — Ambulatory Visit: Payer: Self-pay | Admitting: Family Medicine

## 2024-03-15 DIAGNOSIS — N134 Hydroureter: Secondary | ICD-10-CM

## 2024-03-15 DIAGNOSIS — N200 Calculus of kidney: Secondary | ICD-10-CM

## 2024-03-15 NOTE — Progress Notes (Signed)
 Orders Placed This Encounter  Procedures   Ambulatory referral to Urology    Referral Priority:   Urgent    Referral Type:   Consultation    Referral Reason:   Specialty Services Required    Requested Specialty:   Urology    Number of Visits Requested:   1

## 2024-03-15 NOTE — Progress Notes (Signed)
 HI Christy Gutierrez, your CT shows you do have a kidney stone right at where the ureter meets the bladder.  It is causing some back up pressure on the ureter and the bladder is distended aas well.  The kidney stone is measuring approximately 7 mm so I do want to go ahead and place a referral to urology.

## 2024-03-16 LAB — URINE CULTURE

## 2024-03-16 NOTE — Progress Notes (Signed)
 Great news!  Urine culture is negative.  No sign of urinary tract infection

## 2024-03-19 ENCOUNTER — Telehealth: Payer: Self-pay | Admitting: Family Medicine

## 2024-03-19 NOTE — Telephone Encounter (Signed)
 Copied from CRM [id number]. Topic: General - Other >> Mar 19, 2024  9:35 AM Mercer PEDLAR wrote: Reason for CRM: Rosina calling from [facility] to check status of fax they sent on 03/13/23 regarding discontinuing ELIQUIS  5 MG TABS tablet for 2 days prior to epidural.   Callback: [phone]

## 2024-03-23 ENCOUNTER — Other Ambulatory Visit: Payer: Self-pay | Admitting: Family Medicine

## 2024-03-23 DIAGNOSIS — F32A Depression, unspecified: Secondary | ICD-10-CM

## 2024-03-28 NOTE — Telephone Encounter (Signed)
 Christy Gutierrez with [facility] states she spoke with Christy Gutierrez this morning and was relayed this message.

## 2024-03-28 NOTE — Telephone Encounter (Signed)
 OK to hold for 2 days prior to [facility]

## 2024-03-29 ENCOUNTER — Ambulatory Visit: Admitting: Orthopedic Surgery

## 2024-04-06 ENCOUNTER — Other Ambulatory Visit: Payer: Self-pay | Admitting: Family Medicine

## 2024-04-23 ENCOUNTER — Encounter: Admitting: Physical Medicine and Rehabilitation

## 2024-07-24 ENCOUNTER — Ambulatory Visit

## 2024-09-04 ENCOUNTER — Ambulatory Visit: Admitting: Family Medicine
